# Patient Record
Sex: Male | Born: 1950 | Race: Black or African American | Hispanic: No | Smoking: Current every day smoker
Health system: Southern US, Community
[De-identification: ages and names within clinical notes are randomized; demographics above are authoritative.]

## PROBLEM LIST (undated history)

## (undated) DIAGNOSIS — I252 Old myocardial infarction: Secondary | ICD-10-CM

## (undated) DIAGNOSIS — I10 Essential (primary) hypertension: Secondary | ICD-10-CM

## (undated) DIAGNOSIS — I2510 Atherosclerotic heart disease of native coronary artery without angina pectoris: Secondary | ICD-10-CM

## (undated) HISTORY — DX: Essential (primary) hypertension: I10

## (undated) HISTORY — DX: Atherosclerotic heart disease of native coronary artery without angina pectoris: I25.10

## (undated) HISTORY — DX: Old myocardial infarction: I25.2

## (undated) HISTORY — PX: CORONARY ANGIOPLASTY WITH STENT PLACEMENT: SHX49

---

## 1998-02-16 ENCOUNTER — Emergency Department (HOSPITAL_COMMUNITY): Admission: EM | Admit: 1998-02-16 | Discharge: 1998-02-16 | Payer: Self-pay | Admitting: Emergency Medicine

## 2001-10-15 ENCOUNTER — Emergency Department (HOSPITAL_COMMUNITY): Admission: EM | Admit: 2001-10-15 | Discharge: 2001-10-15 | Payer: Self-pay | Admitting: Emergency Medicine

## 2001-10-15 ENCOUNTER — Encounter: Payer: Self-pay | Admitting: Emergency Medicine

## 2005-11-20 ENCOUNTER — Emergency Department (HOSPITAL_COMMUNITY): Admission: EM | Admit: 2005-11-20 | Discharge: 2005-11-20 | Payer: Self-pay | Admitting: Emergency Medicine

## 2006-06-12 DIAGNOSIS — I252 Old myocardial infarction: Secondary | ICD-10-CM

## 2006-06-12 HISTORY — DX: Old myocardial infarction: I25.2

## 2006-08-22 ENCOUNTER — Inpatient Hospital Stay (HOSPITAL_COMMUNITY): Admission: EM | Admit: 2006-08-22 | Discharge: 2006-08-26 | Payer: Self-pay | Admitting: Emergency Medicine

## 2006-08-23 HISTORY — PX: CARDIAC CATHETERIZATION: SHX172

## 2008-08-03 ENCOUNTER — Emergency Department (HOSPITAL_COMMUNITY): Admission: EM | Admit: 2008-08-03 | Discharge: 2008-08-03 | Payer: Self-pay | Admitting: Emergency Medicine

## 2010-04-07 ENCOUNTER — Ambulatory Visit: Payer: Self-pay | Admitting: Cardiovascular Disease

## 2010-04-20 ENCOUNTER — Telehealth (INDEPENDENT_AMBULATORY_CARE_PROVIDER_SITE_OTHER): Payer: Self-pay | Admitting: *Deleted

## 2010-04-21 ENCOUNTER — Encounter: Payer: Self-pay | Admitting: Cardiology

## 2010-04-21 ENCOUNTER — Ambulatory Visit: Payer: Self-pay | Admitting: Cardiology

## 2010-04-21 ENCOUNTER — Ambulatory Visit: Payer: Self-pay

## 2010-04-21 ENCOUNTER — Encounter (HOSPITAL_COMMUNITY)
Admission: RE | Admit: 2010-04-21 | Discharge: 2010-06-11 | Payer: Self-pay | Source: Home / Self Care | Attending: Cardiovascular Disease | Admitting: Cardiovascular Disease

## 2010-04-21 HISTORY — PX: CARDIOVASCULAR STRESS TEST: SHX262

## 2010-07-12 NOTE — Progress Notes (Signed)
Summary: Nuc Pre-Procedure  Phone Note Outgoing Call Call back at [facility] Phone [phone]   Call placed by: Antionette Char RN,  April 20, 2010 3:28 PM Call placed to: Patient Reason for Call: Confirm/change Appt Summary of Call: Reviewed information on Myoview Information Sheet (see scanned document for further details).  Spoke with patient.     Nuclear Med Background Indications for Stress Test: Evaluation for Ischemia, Stent Patency   History: Angioplasty, Heart Catheterization, Myocardial Infarction, Stents  History Comments: 2008 IWMI, 2008 Stent- RCA  Symptoms: Chest Pain    Nuclear Pre-Procedure Cardiac Risk Factors: Hypertension, Lipids

## 2010-07-12 NOTE — Assessment & Plan Note (Signed)
Summary: Cardiology Nuclear Testing  Nuclear Med Background Indications for Stress Test: Evaluation for Ischemia, Stent Patency   History: Angioplasty, Heart Catheterization, Myocardial Infarction, Stents  History Comments: 2008 IWMI, 2008 Stent- RCA  Symptoms: Chest Pain, Chest Pain with Exertion, Dizziness, DOE, Fatigue with Exertion, Palpitations, Rapid HR    Nuclear Pre-Procedure Cardiac Risk Factors: Hypertension, Lipids, Smoker Caffeine/Decaff Intake: None NPO After: 10:00 PM Lungs: Clear IV 0.9% NS with Angio Cath: 20g     IV Site: R Forearm IV Started by: Irean Hong, RN Chest Size (in) 42     Height (in): 71 Weight (lb): 149 BMI: 20.86  Nuclear Med Study 1 or 2 day study:  1 day     Stress Test Type:  Lexiscan Reading MD:  Olga Millers, MD     Referring MD:  Kristeen Miss Resting Radionuclide:  Technetium 89m Tetrofosmin     Resting Radionuclide Dose:  11 mCi  Stress Radionuclide:  Technetium 53m Tetrofosmin     Stress Radionuclide Dose:  33 mCi   Stress Protocol      Max HR:  98 bpm     Predicted Max HR:  163 bpm  Max Systolic BP: 141 mm Hg     Percent Max HR:  60.12 %Rate Pressure Product:  [address]  Lexiscan: 0.4 mg   Stress Test Technologist:  Irean Hong,  RN     Nuclear Technologist:  Domenic Polite, CNMT  Rest Procedure  Myocardial perfusion imaging was performed at rest 45 minutes following the intravenous administration of Technetium 22m Tetrofosmin.  Stress Procedure  The patient received IV Lexiscan 0.4 mg over 15-seconds.  Technetium 12m Tetrofosmin injected at 30-seconds.  The EKG was nondiagnostic due to baseline changes with lexiscan.  Quantitative spect images were obtained after a 45 minute delay.  QPS Raw Data Images:  Acquisition technically good; normal left ventricular size. Stress Images:  There is decreased uptake in the inferior wall Rest Images:  There is decreased uptake in the inferior wall slightly less prominent compared to the  stress images. Subtraction (SDS):  These findings are consistent with small prior inferobasal infarct with trivial peri-infarct ischemia. Transient Ischemic Dilatation:  1.05  (Normal <1.22)  Lung/Heart Ratio:  0.22  (Normal <0.45)  Quantitative Gated Spect Images QGS EDV:  85 ml QGS ESV:  42 ml QGS EF:  51 % QGS cine images:  Mild hypokinesis of the inferior wall.   Overall Impression  Exercise Capacity: Lexiscan with no exercise. BP Response: Normal blood pressure response. Clinical Symptoms: No chest pain ECG Impression: No significant ST segment change suggestive of ischemia. Overall Impression: Abnormal lexiscan nuclear study with small prior inferobasal infarct and trivial peri-infarct ischemia.  Appended Document: Cardiology Nuclear Testing copy sent to Dr.Nasher

## 2010-09-06 ENCOUNTER — Telehealth: Payer: Self-pay | Admitting: *Deleted

## 2010-09-06 DIAGNOSIS — N529 Male erectile dysfunction, unspecified: Secondary | ICD-10-CM

## 2010-09-06 DIAGNOSIS — I10 Essential (primary) hypertension: Secondary | ICD-10-CM

## 2010-09-06 MED ORDER — TADALAFIL 20 MG PO TABS: 20.0000 mg | ORAL_TABLET | Freq: Every day | ORAL | Status: AC | PRN

## 2010-09-06 NOTE — Telephone Encounter (Signed)
Message copied by Erskine Squibb on Tue Sep 06, 2010  1:08 PM ------      Message from: Delrae Alfred      Created: Tue Sep 06, 2010 10:25 AM      Regarding: NAHSER      Contact: [phone]       REQUESTING SAMPLES OF CIALIS

## 2010-09-27 LAB — URINALYSIS, ROUTINE W REFLEX MICROSCOPIC
Protein, ur: NEGATIVE mg/dL
Urobilinogen, UA: 0.2 mg/dL (ref 0.0–1.0)

## 2010-09-27 LAB — CBC
MCV: 92.5 fL (ref 78.0–100.0)
RBC: 5.42 MIL/uL (ref 4.22–5.81)
WBC: 9.3 10*3/uL (ref 4.0–10.5)

## 2010-09-27 LAB — POCT CARDIAC MARKERS
CKMB, poc: 4.9 ng/mL (ref 1.0–8.0)
Myoglobin, poc: 124 ng/mL (ref 12–200)
Troponin i, poc: <0.05 ng/mL (ref 0.00–0.09)

## 2010-09-27 LAB — DIFFERENTIAL
Lymphs Abs: 0.4 10*3/uL — ABNORMAL LOW (ref 0.7–4.0)
Monocytes Relative: 3 % (ref 3–12)
Neutro Abs: 8.6 10*3/uL — ABNORMAL HIGH (ref 1.7–7.7)
Neutrophils Relative %: 92 % — ABNORMAL HIGH (ref 43–77)

## 2010-09-27 LAB — BRAIN NATRIURETIC PEPTIDE: Pro B Natriuretic peptide (BNP): <30 pg/mL (ref 0.0–100.0)

## 2010-09-27 LAB — POCT I-STAT, CHEM 8
Chloride: 105 mEq/L (ref 96–112)
Creatinine, Ser: 1.2 mg/dL (ref 0.4–1.5)
Glucose, Bld: 115 mg/dL — ABNORMAL HIGH (ref 70–99)
Potassium: 3.4 mEq/L — ABNORMAL LOW (ref 3.5–5.1)

## 2010-10-25 NOTE — H&P (Signed)
NAME:  Javier Rios, Javier Rios NO.:  [id number]   MEDICAL RECORD NO.:  [id number]          PATIENT TYPE:  EMS   LOCATION:  MAJO                         FACILITY:  [facility]   PHYSICIAN:  Peter M. Swaziland, M.D.  DATE OF BIRTH:  Apr 05, 1951   DATE OF ADMISSION:  08/03/2008  DATE OF DISCHARGE:                              HISTORY & PHYSICAL   Mr. Debruhl is a 60 year old black male who is seen at the request of Dr.  Norlene Campbell in the emergency department for evaluation of chest pain.  The  patient has a history of coronary artery disease and had an acute  inferior myocardial infarction in March 2008.  This was treated with  direct stenting of the mid right coronary artery using a 2.75 x 16 mm  Taxus stent.  It was noted at that time that he had 60-70% disease in  the mid LAD that has been treated medically.  He has a history of  hypertension and dyslipidemia.  The patient works as Electrical engineer at  night.  He noted that while working early this morning at 2-3 a.m., he  developed a needle-like sensation in his right chest.  This lasted a few  seconds and then resolved.  He had it approximately 3 times and then no  more.  He subsequently went home and slept this morning.  He awoke at  2:30 this afternoon feeling sick.  He had acute nausea and vomiting x2  and diarrhea.  He had no fever or chills.  He has had no chest pain or  shortness of breath.  He noticed no blood in either his stools or  vomitus.  He admits that he has not been taking his Plavix for the past  month because he could not afford it.  He is also not taking cholesterol  medication.  He is taking aspirin and his blood pressure medication.  He  currently has no pain.  The patient reports that both his sister and  mother have had acute viral gastroenteritis this past week.   PAST MEDICAL HISTORY:  1. Hypertension.  2. Dyslipidemia.  3. Coronary artery disease as noted above.   MEDICATIONS:  1. Aspirin daily.  2.  Lisinopril/hydrochlorothiazide 20/25 mg per day.   He has no known allergies.   SOCIAL HISTORY:  The patient works as a Electrical engineer.  He also works  part time on a loading dock.  He is married.  He denies tobacco or  alcohol use.   His family history is positive for hypertension and stroke.   REVIEW OF SYSTEMS:  He has had no bleeding problems, stroke, or TIA.  He  has had no edema, orthopnea, or PND.  He denies any cough.  He has had  no dysuria.  All other systems were reviewed and are negative.   PHYSICAL EXAMINATION:  GENERAL:  The patient is a well-developed black  male, in no apparent distress.  VITAL SIGNS:  His blood pressure is 145/91, pulse is 73 and regular,  respirations 18, temperature is 96.4, sats 95% on room air.  HEENT:  He  is normocephalic, atraumatic.  Pupils equal, round, and  reactive.  Sclerae clear.  Oropharynx is clear.  NECK:  Supple without JVD, adenopathy, thyromegaly, or bruits.  LUNGS:  Clear to auscultation and percussion.  CARDIAC:  Regular rate and rhythm without murmur, rub, gallop, or click.  There is no chest wall tenderness to palpation.  ABDOMEN:  Soft, nontender.  Positive bowel sound.  No masses or  hepatosplenomegaly.  There is no rebound.  EXTREMITIES:  Femoral and pedal pulses are 2+ and symmetric.  He has no  edema or cyanosis.  SKIN:  Warm and dry.  NEUROLOGIC:  He is alert and oriented x4.  Cranial nerves II-XII are  intact.   LABORATORY DATA:  Chest x-ray shows no active disease.  ECG shows normal  sinus rhythm.  There is T-wave inversion in leads II, III, aVF, and V5-  V6.  While this is new compared to 2006, I do not have a post MI ECG  from 2008 to compare to.  There is no ST elevation or depression.   White count is 9300, hemoglobin 17.6, hematocrit 50.1, platelets  224,000.  Sodium is 134, potassium 3.4, chloride 105, CO2 24, BUN 16,  creatinine 1.2, glucose 115.  Urinalysis is negative.  BNP is less than  30.   Point-of-care cardiac enzymes are negative x1.   IMPRESSION:  1. Acute nausea, vomiting, diarrhea, consistent with viral      gastroenteritis.  2. Extremely atypical chest pain, unlikely that this is cardiac      related.  3. Hypertension.  4. Hypercholesterolemia.  5. Coronary artery disease with prior inferior myocardial infarction      in 2008 with stenting of the right coronary artery.   PLAN:  There is really nothing in the history to support acute cardiac  cause of his symptoms.  I will treat him for gastroenteritis.  We would  recommend to follow up with Dr. Harvie Bridge outpatient to review his  medical therapy and hopefully get him back on his Plavix and a statin  agent.  We will continue his current blood pressure medications.           ______________________________  Peter M. Sw[place]iland, M.D.     PMJ/MEDQ  D:  08/03/2008  T:  08/04/2008  Job:  [id number]   cc:   Vesta Mixer, M.D.  Robert A. Nicholos Johns, M.D.

## 2010-10-28 NOTE — Cardiovascular Report (Signed)
NAME:  Javier Rios, TIEMANN NO.:  [id number]   MEDICAL RECORD NO.:  [id number]          PATIENT TYPE:  INP   LOCATION:  2916                         FACILITY:  [facility]   PHYSICIAN:  Vesta Mixer, M.D. DATE OF BIRTH:  09-01-1950   DATE OF PROCEDURE:  DATE OF DISCHARGE:                            CARDIAC CATHETERIZATION   Mr. [name] is a 60 year old gentleman with a history of hypertension,  hypercholesterolemia, and cigarette smoking.  He was admitted this  evening by Dr. Waldon Reining for episodes of chest pain that occurred around 6  p.m. while he was playing softball.  The patient had positive cardiac  enzymes.  In addition, he developed episodes of bradycardia and also had  nonsustained ventricular tachycardia.  He was referred to [facility] for  urgent heart catheterization based on these issues.   The patient was still having some chest discomfort and when he arrived.   PROCEDURE:  Left heart catheterization with coronary angiography.  We  performed PCI of the mid-right coronary artery posterolateral branch and  the posterior descending artery.   The right femoral artery was easily cannulated using a modified  Seldinger technique.   HEMODYNAMIC RESULTS:  LV pressure is 108/12, with an aortic pressure of  107/62.   ANGIOGRAPHY:  Left main:  The left main has a proximal 30%-40% stenosis.  It maybe a little bit more severe in other views.   The left anterior descending artery is normal in the proximal segment.  There is a mid 70%-75% stenosis.  Following this, there is diffuse  disease between 50%-60% for the next 10-15 mm.  The distal LAD has mild  irregularities.   The left the first diagonal artery originates at the site of the 70%-75%  stenosis.   The left circumflex artery is a large vessel.  There is a proximal 20%-  30% stenosis.  The obtuse marginal branch is normal.   The right coronary artery is a moderate-sized vessel and is diffusely  diseased.  There  is a moderate 80%-90% ulcerated plaque in the mid-  lesion.  The distal RCA is filled with thrombus.   The left ventriculogram was performed in the 30 RAO position.  It  reveals well-preserved left ventricular systolic function.  The ejection  fraction is around 65%.  There is inferobasilar akinesis.   A 6-French Judkins right 4 side-hole guide was positioned in the right  coronary artery.  The patient was given 3600 units of heparin followed  by a second bolus of Integrilin.  Because the patient had had problems  with bradycardia and because he had an occluded right coronary artery we  gave him 1 mg of atropine.   A BMW wire was placed down to the right coronary artery out into the  posterolateral branch.  We initially attempted to use a Fetch catheter  to aspirate thrombus.  The Fetch catheter would not cross into the  distal right coronary artery.   At this point, a 2.5 x 15 mm Maverick was used and crossed into the  posterolateral branch.  It was inflated up to 3 atmospheres for  24  seconds.  We then pulled it back and inflated it up to 6 atmospheres.  We then positioned wire down the posterior descending artery.  When that  became visible, we inflated down there for 6 atmospheres.  This resulted  in significant improvement of the distal lesion.  There was still a  significant lesion in the midvessel.  There was still some thrombus  remain in the posterior descending artery which did not clear.   At this point, a 2.75 16 mm Taxus stent was placed across the mid-RCA  stenosis.  It was deployed at 12 atmospheres for 35 seconds.   Post-stent dilatation was achieved using a 3.0 x 15-mm Quantum Monorail.  We inflated up to 13 atmospheres for 15 seconds, followed by 15  atmospheres for 23 seconds.  This resulted in a significant improvement.  There was some sluggish flow following stent placement, but this  gradually cleared.   There was still significant thrombus in the posterior  descending artery.  We again tried to place the Fetch catheter down into the posterior  descending artery, but it once again would not cross.  We attempted to  daughter the clot with the wire but were not able to gain much progress.   We decided to stop the procedure with the successful angioplasty of the  mid-right coronary artery and opening of the posterolateral branch.  We  will continue Integrilin in hopes that the posterior descending artery  will clean up.   The patient has a significant mid-LAD stenosis that will need to be  addressed at a later date.  He also has a moderate left main stenosis  that needs to be considered.  He will either need stenting or  consideration for bypass grafting.  The patient is stable leaving the  catheterization lab.           ______________________________  Vesta Mixer, M.D.     PJN/MEDQ  D:  08/23/2006  T:  08/24/2006  Job:  [id number]   cc:   Molly Maduro A. Nicholos Johns, M.D.

## 2010-10-28 NOTE — Discharge Summary (Signed)
Javier Rios, CORDIAL NO.:  [id number]   MEDICAL RECORD NO.:  [id number]          PATIENT TYPE:  INP   LOCATION:  2027                         FACILITY:  [facility]   PHYSICIAN:  Vesta Mixer, M.D. DATE OF BIRTH:  [birth year]   DATE OF ADMISSION:  08/23/2006  DATE OF DISCHARGE:  08/26/2006                               DISCHARGE SUMMARY   DISCHARGE DIAGNOSES:  1. Coronary disease, status post inferior wall myocardial infarction.      He is status post percutaneous transluminal coronary angioplasty      and stenting of the mid-right coronary artery.  2. Known stenosis in his left anterior descending diagonal system.  3. Hypertension.  4. Dyslipidemia.   DISCHARGE MEDICATIONS:  1. Plavix 75 mg a day.  2. Enteric-coated aspirin 81 mg a day.  3. Metoprolol 25 mg a day.  4. Nitroglycerin 0.4 mg sublingually as needed.  5. Benicar/HCT 40 mg/25 mg 1/2 tablet a day.  6. Potassium chloride 20 mEq a day.  7. Lipitor 40 mg a day.   DISPOSITION:  The patient will see Dr. Elease Hashimoto in 1-2 weeks.  He is to  follow-up with Dr. Nicholos Johns as needed.   HISTORY:  Mr.  Rios is a 60 year old gentleman who presented with an  acute inferior wall myocardial infarction.  Please see dictated H&P for  further details.   HOSPITAL COURSE:  1. Inferior wall myocardial infarction.  The patient was admitted with      chest pain but had progressive ST changes with persistent chest      pain.  He underwent emergent heart catheterization and was found to      have a complete occlusion of his right coronary artery.  There was      a huge thrombus burden in the right coronary artery.  We attempted      to aspirate some of the thrombus out with a fetch catheter and then      placed a stent.  He still had some persistent thrombus down the      posterior descending artery that we were not able to clear.  At the      time of the catheterization, he was found to have a 60%-70% mid-LAD      lesion.   This lesion is quite eccentric.  We did not angioplasty to      the LAD.  The patient did well but did have some nonsustained      ventricular tachycardia the day following the procedure.  We      started him on some metoprolol, and this improved.  We also found      that his potassium was low, and this was also replaced.  The      patient will be discharged in satisfactory condition.  We will do a      stress Cardiolite study in about a month to assess his LAD      stenosis.  If he is found to have ischemia in the anterior      distribution, we will proceed with angioplasty and stenting  of the      LAD.  Otherwise, we will continue with medical therapy.  2. Dyslipidemia.  The patient had been placed on Caduet in the past.      He stopped he medication because it caused him to become      hypotensive.  I have told him that we should go ahead and start him      on some Lipitor, and that should not cause any decrease in his      blood pressure.  The Caduet lowered his blood pressure because of      the amlodipine component.  3. Hypokalemia.  The patient was admitted on Benicar/HCT.  He was      hypokalemic.  We started him on some potassium, and this was      gradually improved.  We will discharge him home on some potassium.  4. All of his other medical problems remained stable.  He will follow      up with Dr. Elease Hashimoto in several weeks.           ______________________________  Vesta Mixer, M.D.     PJN/MEDQ  D:  08/26/2006  T:  08/27/2006  Job:  [id number]   cc:   Molly Maduro A. Nicholos Johns, M.D.

## 2011-03-23 ENCOUNTER — Encounter: Payer: Self-pay | Admitting: Cardiovascular Disease

## 2011-03-29 ENCOUNTER — Other Ambulatory Visit: Payer: Self-pay | Admitting: *Deleted

## 2011-03-29 ENCOUNTER — Encounter: Payer: Self-pay | Admitting: Cardiovascular Disease

## 2011-03-29 ENCOUNTER — Ambulatory Visit (INDEPENDENT_AMBULATORY_CARE_PROVIDER_SITE_OTHER): Payer: Self-pay | Admitting: Cardiovascular Disease

## 2011-03-29 VITALS — BP 136/94 | HR 67 | Ht 71.0 in | Wt 149.0 lb

## 2011-03-29 DIAGNOSIS — I2510 Atherosclerotic heart disease of native coronary artery without angina pectoris: Secondary | ICD-10-CM | POA: Insufficient documentation

## 2011-03-29 MED ORDER — METOPROLOL TARTRATE 25 MG PO TABS: ORAL_TABLET | ORAL | Status: DC

## 2011-03-29 MED ORDER — ATORVASTATIN CALCIUM 40 MG PO TABS: 40.0000 mg | ORAL_TABLET | Freq: Every day | ORAL | Status: DC

## 2011-03-29 MED ORDER — NITROGLYCERIN 0.4 MG SL SUBL: 0.4000 mg | SUBLINGUAL_TABLET | SUBLINGUAL | Status: DC | PRN

## 2011-03-29 NOTE — Progress Notes (Signed)
Javier Rios Date of Birth  Oct 05, 1950 [facility] [address]    [address] [place], [place]  [address] [phone]  Fax  [phone]  History of Present Illness:  60 year old gentleman with a history of coronary artery disease. He is status post PTCA and stenting of his right coronary artery. He has a moderate stenosis in his LAD.  His last stress Myoview study was November, 2011. There is no evidence of anterior wall ischemia. He has a mild defect in his Inderal which corresponds to his previous inferior basilar myocardial infarction.  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 Current Outpatient Prescriptions on File Prior to Visit  Medication Sig Dispense Refill  . aspirin 81 MG tablet Take 81 mg by mouth daily.        . hydrochlorothiazide (HYDRODIURIL) 25 MG tablet Take 25 mg by mouth daily.        . nitroGLYCERIN (NITROSTAT) 0.4 MG SL tablet Place 0.4 mg under the tongue every 5 (five) minutes as needed.          No Known Allergies  Past Medical History  Diagnosis Date  . Coronary artery disease     Status post PTCA and stenting of his right coronary artery in March, 2008. He also has a 40-50% LAD stenosis  . LAD stenosis     MODERATE BETWEEN 50-60%  . Hypertension   . Chest pain   . 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 Cardiac catheterization 08/23/2006    EF 65%. THERE IS INFEROBASILAR AKINESIS  . Coronary angioplasty with stent placement     STENTING OF HIS RCA  . Cardiovascular stress test 04/21/2010    EF 51%. NO SIGNIFICANT ST SEGMENT CHANGE SUGGESTIVE OF ISCHEMIA    History  Smoking status  . Former Smoker  . Quit date: 08/11/2006  Smokeless tobacco  . Not on file    History  Alcohol Use No    No family history on file.  Reviw of Systems:  Reviewed in the HPI.  All other systems are negative.  Physical Exam: BP 136/94  Pulse 67  Ht 5\' 11"  (1.803 m)  Wt 149 lb  (67.586 kg)  BMI 20.78 kg/m2 The patient is alert and oriented x 3.  The mood and affect are normal.   Skin: warm and dry.  Color is normal.    HEENT:   the sclera are nonicteric.  The mucous membranes are moist.  The carotids are 2+ without bruits.  There is no thyromegaly.  There is no JVD.    Lungs: clear.  The chest wall is non tender.    Heart: regular rate with a normal S1 and S2.  There are no murmurs, gallops, or rubs. The PMI is not displaced.     Abdomen: good bowel sounds.  There is no guarding or rebound.  There is no hepatosplenomegaly or tenderness.  There are no masses.   Extremities:  no clubbing, cyanosis, or edema.  The legs are without rashes.  The distal pulses are intact.   Neuro:  Cranial nerves II - XII are intact.  Motor and sensory functions are intact.    The gait is normal.  ECG:  Assessment / Plan:

## 2011-03-29 NOTE — Assessment & Plan Note (Signed)
Dionis doing very well from a cardiac standpoint.  He's discontinued several of his medications on his own. He was previously on Lipitor but stopped it because of a cost issue and that now Lipitor is generic and have  started that medication. We also used abnormal and the Toprol but then restarted Toprol-XL 5 mg twice a day. I've also refilled his nitroglycerin. I'll see her again in 6 months.

## 2011-03-29 NOTE — Patient Instructions (Addendum)
Your physician wants you to follow-up in: 6 months, You will receive a reminder letter in the mail two months in advance. If you don't receive a letter, please call our office to schedule the follow-up appointment.  Your physician recommends that you return for lab work in: 3 months FASTING  Your refills have been done.

## 2011-06-14 ENCOUNTER — Other Ambulatory Visit: Payer: Self-pay | Admitting: *Deleted

## 2011-06-14 MED ORDER — HYDROCHLOROTHIAZIDE 25 MG PO TABS: 25.0000 mg | ORAL_TABLET | Freq: Every day | ORAL | Status: DC

## 2011-06-14 NOTE — Telephone Encounter (Signed)
Fax Received. Refill Completed. Lajoya Dombek Chowoe (R.M.A)   

## 2011-06-29 ENCOUNTER — Other Ambulatory Visit: Payer: Self-pay | Admitting: *Deleted

## 2011-09-19 ENCOUNTER — Ambulatory Visit: Payer: Self-pay | Admitting: Cardiovascular Disease

## 2011-09-22 ENCOUNTER — Ambulatory Visit: Payer: Self-pay | Admitting: Cardiovascular Disease

## 2011-10-12 ENCOUNTER — Encounter: Payer: Self-pay | Admitting: Cardiovascular Disease

## 2011-10-12 ENCOUNTER — Ambulatory Visit (INDEPENDENT_AMBULATORY_CARE_PROVIDER_SITE_OTHER): Payer: Self-pay | Admitting: Cardiovascular Disease

## 2011-10-12 VITALS — BP 140/95 | HR 76 | Ht 71.0 in | Wt 149.8 lb

## 2011-10-12 DIAGNOSIS — I2510 Atherosclerotic heart disease of native coronary artery without angina pectoris: Secondary | ICD-10-CM

## 2011-10-12 DIAGNOSIS — E785 Hyperlipidemia, unspecified: Secondary | ICD-10-CM

## 2011-10-12 DIAGNOSIS — I10 Essential (primary) hypertension: Secondary | ICD-10-CM

## 2011-10-12 MED ORDER — POTASSIUM CHLORIDE CRYS ER 10 MEQ PO TBCR: 10.0000 meq | EXTENDED_RELEASE_TABLET | Freq: Every day | ORAL | Status: DC

## 2011-10-12 MED ORDER — METOPROLOL TARTRATE 25 MG PO TABS: ORAL_TABLET | ORAL | Status: DC

## 2011-10-12 MED ORDER — HYDROCHLOROTHIAZIDE 25 MG PO TABS: 25.0000 mg | ORAL_TABLET | Freq: Every day | ORAL | Status: DC

## 2011-10-12 MED ORDER — SIMVASTATIN 40 MG PO TABS: 40.0000 mg | ORAL_TABLET | Freq: Every evening | ORAL | Status: DC

## 2011-10-12 NOTE — Patient Instructions (Addendum)
Your physician recommends that you schedule a follow-up appointment in: 3 months  Your physician recommends that you return for a FASTING lipid profile: 3 months   Your physician has recommended you make the following change in your medication:  Stop atorvastatin Start simvastatin 40 mg daily Start potassium 10 meq daily

## 2011-10-12 NOTE — Progress Notes (Signed)
[place] [facility] [address] [place], [place]  [address]   Office [address] [place], [place]  [address]  Javier Rios Date of Birth  1950-08-08  Problem list: 1. History of coronary artery disease-status post PTCA and stenting of his right coronary artery-08/23/2006 he has moderate disease involving his LAD. 2. Hypertension 3. Hyperlipidemia  History of Present Illness:  61 year old gentleman with a history of coronary artery disease. He is status post PTCA and stenting of his right coronary artery. He has a moderate stenosis in his LAD.  His last stress Myoview study was November, 2011. There is no evidence of anterior wall ischemia. He has a mild defect in his inferior wall which corresponds to his previous inferior basilar myocardial infarction.  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  He has run out of most of his medications.    Current Outpatient Prescriptions on File Prior to Visit  Medication Sig Dispense Refill  . aspirin 81 MG tablet Take 81 mg by mouth daily.        . hydrochlorothiazide (HYDRODIURIL) 25 MG tablet Take 1 tablet (25 mg total) by mouth daily.  30 tablet  3  . atorvastatin (LIPITOR) 40 MG tablet Take 1 tablet (40 mg total) by mouth daily.  30 tablet  5  . metoprolol tartrate (LOPRESSOR) 25 MG tablet One half tablet (12.5 mg) twice daily- 12 hours apart  30 tablet  5  . nitroGLYCERIN (NITROSTAT) 0.4 MG SL tablet Place 1 tablet (0.4 mg total) under the tongue every 5 (five) minutes as needed.  25 tablet  5    No Known Allergies  Past Medical History  Diagnosis Date  . Coronary artery disease     Status post PTCA and stenting of his right coronary artery in March, 2008. He also has a 40-50% LAD stenosis  . LAD stenosis     MODERATE BETWEEN 50-60%  . Hypertension   . Chest pain   . 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 Cardiac  catheterization 08/23/2006    EF 65%. THERE IS INFEROBASILAR AKINESIS  . Coronary angioplasty with stent placement     STENTING OF HIS RCA  . Cardiovascular stress test 04/21/2010    EF 51%. NO SIGNIFICANT ST SEGMENT CHANGE SUGGESTIVE OF ISCHEMIA    History  Smoking status  . Current Everyday Smoker  Smokeless tobacco  . Not on file    History  Alcohol Use No    No family history on file.  Reviw of Systems:  Reviewed in the HPI.  All other systems are negative.  Physical Exam: BP 199/105  Pulse 76  Ht 5\' 11"  (1.803 m)  Wt 149 lb 12.8 oz (67.949 kg)  BMI 20.89 kg/m2 The patient is alert and oriented x 3.  The mood and affect are normal.   Skin: warm and dry.  Color is normal.    HEENT:   the sclera are nonicteric.  The mucous membranes are moist.  The carotids are 2+ without bruits.  There is no thyromegaly.  There is no JVD.    Lungs: clear.  The chest wall is non tender.    Heart: regular rate with a normal S1 and S2.  There are no murmurs, gallops, or rubs. The PMI is not displaced.     Abdomen: good bowel sounds.  There is no  guarding or rebound.  There is no hepatosplenomegaly or tenderness.  There are no masses.   Extremities:  no clubbing, cyanosis, or edema.  The legs are without rashes.  The distal pulses are intact.   Neuro:  Cranial nerves II - XII are intact.  Motor and sensory functions are intact.    The gait is normal.  ECG:  Assessment / Plan:

## 2011-10-12 NOTE — Assessment & Plan Note (Signed)
Javier Rios presents today for followup. He has run out of all of his medications except for his aspirin. He cannot afford the atorvastatin so we will change him to simvastatin. I've refilled all of his other medications.  He continues to smoke one pack of cigarettes a day. His costs $3 per day. I reminded him that he would be able to afford his medications if  he stopped smoking.  I will see him again   in 3 months for lab work. I'll see him in 6 months for an office visit.

## 2011-10-12 NOTE — Assessment & Plan Note (Signed)
His blood pressure was markedly elevated when he first came in. After several minutes it came down to 140/95 range. I've encouraged him to continue to take his medications. I've asked him to stop smoking which would help him be able to afford his medications.  We talked about complications of long-standing and uncontrolled hypertension. He seems to understand.

## 2012-01-23 ENCOUNTER — Other Ambulatory Visit (INDEPENDENT_AMBULATORY_CARE_PROVIDER_SITE_OTHER): Payer: Self-pay

## 2012-01-23 ENCOUNTER — Encounter: Payer: Self-pay | Admitting: Cardiovascular Disease

## 2012-01-23 ENCOUNTER — Telehealth: Payer: Self-pay | Admitting: Cardiovascular Disease

## 2012-01-23 ENCOUNTER — Ambulatory Visit (INDEPENDENT_AMBULATORY_CARE_PROVIDER_SITE_OTHER): Payer: Self-pay | Admitting: Cardiovascular Disease

## 2012-01-23 VITALS — BP 154/104 | HR 68 | Ht 71.0 in | Wt 141.4 lb

## 2012-01-23 DIAGNOSIS — E785 Hyperlipidemia, unspecified: Secondary | ICD-10-CM

## 2012-01-23 DIAGNOSIS — I10 Essential (primary) hypertension: Secondary | ICD-10-CM

## 2012-01-23 DIAGNOSIS — I2510 Atherosclerotic heart disease of native coronary artery without angina pectoris: Secondary | ICD-10-CM

## 2012-01-23 LAB — HEPATIC FUNCTION PANEL
AST: 23 U/L (ref 0–37)
Alkaline Phosphatase: 65 U/L (ref 39–117)
Total Bilirubin: 0.5 mg/dL (ref 0.3–1.2)

## 2012-01-23 LAB — BASIC METABOLIC PANEL
Calcium: 9.6 mg/dL (ref 8.4–10.5)
Chloride: 97 mEq/L (ref 96–112)
Creatinine, Ser: 1 mg/dL (ref 0.4–1.5)
GFR: 101.8 mL/min (ref >60.00)

## 2012-01-23 LAB — LIPID PANEL
Cholesterol: 217 mg/dL — ABNORMAL HIGH (ref 0–200)
HDL: 61.4 mg/dL (ref >39.00)
Total CHOL/HDL Ratio: 4
VLDL: 24.4 mg/dL (ref 0.0–40.0)

## 2012-01-23 LAB — LDL CHOLESTEROL, DIRECT: Direct LDL: 134.7 mg/dL

## 2012-01-23 NOTE — Patient Instructions (Addendum)
Your physician recommends that you schedule a follow-up appointment in: 3 months to review BP  REDUCE HIGH SODIUM FOODS LIKE CANNED SOUP, GRAVY, SAUCES, READY PREPARED FOODS LIKE FROZEN FOODS; LEAN CUISINE, LASAGNA. BACON, SAUSAGE, LUNCH MEAT, FAST FOODS.Marland Kitchen   DASH Diet The DASH diet stands for "Dietary Approaches to Stop Hypertension." It is a healthy eating plan that has been shown to reduce high blood pressure (hypertension) in as little as 14 days, while also possibly providing other significant health benefits. These other health benefits include reducing the risk of breast cancer after menopause and reducing the risk of type 2 diabetes, heart disease, colon cancer, and stroke. Health benefits also include weight loss and slowing kidney failure in patients with chronic kidney disease.  DIET GUIDELINES  Limit salt (sodium). Your diet should contain less than 1500 mg of sodium daily.   Limit refined or processed carbohydrates. Your diet should include mostly whole grains. Desserts and added sugars should be used sparingly.   Include small amounts of heart-healthy fats. These types of fats include nuts, oils, and tub margarine. Limit saturated and trans fats. These fats have been shown to be harmful in the body.  CHOOSING FOODS  The following food groups are based on a 2000 calorie diet. See your Registered Dietitian for individual calorie needs. Grains and Grain Products (6 to 8 servings daily)  Eat More Often: Whole-wheat bread, brown rice, whole-grain or wheat pasta, quinoa, popcorn without added fat or salt (air popped).   Eat Less Often: White bread, white pasta, white rice, cornbread.  Vegetables (4 to 5 servings daily)  Eat More Often: Fresh, frozen, and canned vegetables. Vegetables may be raw, steamed, roasted, or grilled with a minimal amount of fat.   Eat Less Often/Avoid: Creamed or fried vegetables. Vegetables in a cheese sauce.  Fruit (4 to 5 servings daily)  Eat More Often:  All fresh, canned (in natural juice), or frozen fruits. Dried fruits without added sugar. One hundred percent fruit juice ( cup [237 mL] daily).   Eat Less Often: Dried fruits with added sugar. Canned fruit in light or heavy syrup.  [employer], Fish, and Poultry (2 servings or less daily. One serving is 3 to 4 oz [85-114 g]).  Eat More Often: Ninety percent or leaner ground beef, tenderloin, sirloin. Round cuts of beef, chicken breast, [place] breast. All fish. Grill, bake, or broil your meat. Nothing should be fried.   Eat Less Often/Avoid: Fatty cuts of meat, [place], or chicken leg, thigh, or wing. Fried cuts of meat or fish.  Dairy (2 to 3 servings)  Eat More Often: Low-fat or fat-free milk, low-fat plain or light yogurt, reduced-fat or part-skim cheese.   Eat Less Often/Avoid: Milk (whole, 2%, skim, or chocolate).Whole milk yogurt. Full-fat cheeses.  Nuts, Seeds, and Legumes (4 to 5 servings per week)  Eat More Often: All without added salt.   Eat Less Often/Avoid: Salted nuts and seeds, canned beans with added salt.  Fats and Sweets (limited)  Eat More Often: Vegetable oils, tub margarines without trans fats, sugar-free gelatin. Mayonnaise and salad dressings.   Eat Less Often/Avoid: Coconut oils, palm oils, butter, stick margarine, cream, half and half, cookies, candy, pie.  FOR MORE INFORMATION The Dash Diet Eating Plan: www.dashdiet.org Document Released: 05/18/2011 Document Reviewed: 05/08/2011 [facility] Patient Information 2012 [place], [place].

## 2012-01-23 NOTE — Progress Notes (Signed)
[place] [facility] [address] [place], [place]  [address]  [place] Office [address] [place], [place]  [address]  Javier Rios Date of Birth  1950-08-10  Problem list: 1. History of coronary artery disease-status post PTCA and stenting of his right coronary artery-08/23/2006 he has moderate disease involving his LAD. 2. Hypertension 3. Hyperlipidemia  History of Present Illness:  61 year old gentleman with a history of coronary artery disease. He is status post PTCA and stenting of his right coronary artery. He has a moderate stenosis in his LAD.  His last stress Myoview study was November, 2011. There is no evidence of anterior wall ischemia. He has a mild defect in his inferior wall which corresponds to his previous inferior basilar myocardial infarction.  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  He is not having any chest pain.  He has trouble with getting his meds.  He is eating lots of bologna because it is inexpensive.  His power to his apartment is off and he is unable to eat regular food.  He has been going to [employer] and eating chicken biscuits.       Current Outpatient Prescriptions on File Prior to Visit  Medication Sig Dispense Refill  . aspirin 81 MG tablet Take 81 mg by mouth daily.        . hydrochlorothiazide (HYDRODIURIL) 25 MG tablet Take 1 tablet (25 mg total) by mouth daily.  30 tablet  3  . metoprolol tartrate (LOPRESSOR) 25 MG tablet One half tablet (12.5 mg) twice daily- 12 hours apart  30 tablet  5  . nitroGLYCERIN (NITROSTAT) 0.4 MG SL tablet Place 1 tablet (0.4 mg total) under the tongue every 5 (five) minutes as needed.  25 tablet  5  . potassium chloride (K-DUR,KLOR-CON) 10 MEQ tablet Take 1 tablet (10 mEq total) by mouth daily.  30 tablet  5  . simvastatin (ZOCOR) 40 MG tablet Take 1 tablet (40 mg total) by mouth every evening.  30 tablet  5    No Known Allergies  Past  Medical History  Diagnosis Date  . Coronary artery disease     Status post PTCA and stenting of his right coronary artery in March, 2008. He also has a 40-50% LAD stenosis  . LAD stenosis     MODERATE BETWEEN 50-60%  . Hypertension   . Chest pain   . 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 Cardiac catheterization 08/23/2006    EF 65%. THERE IS INFEROBASILAR AKINESIS  . Coronary angioplasty with stent placement     STENTING OF HIS RCA  . Cardiovascular stress test 04/21/2010    EF 51%. NO SIGNIFICANT ST SEGMENT CHANGE SUGGESTIVE OF ISCHEMIA    History  Smoking status  . Current Everyday Smoker  Smokeless tobacco  . Not on file    History  Alcohol Use No    No family history on file.  Reviw of Systems:  Reviewed in the HPI.  All other systems are negative.  Physical Exam: BP 154/104  Pulse 68  Ht 5\' 11"  (1.803 m)  Wt 141 lb 6.4 oz (64.139 kg)  BMI 19.72 kg/m2 The patient is alert and oriented x 3.  The mood and affect are normal.   Skin: warm and dry.  Color is normal.    HEENT:   the sclera are nonicteric.  The  mucous membranes are moist.  The carotids are 2+ without bruits.  There is no thyromegaly.  There is no JVD.    Lungs: clear.  The chest wall is non tender.    Heart: regular rate with a normal S1 and S2.  There are no murmurs, gallops, or rubs. The PMI is not displaced.     Abdomen: good bowel sounds.  There is no guarding or rebound.  There is no hepatosplenomegaly or tenderness.  There are no masses.   Extremities:  no clubbing, cyanosis, or edema.  The legs are without rashes.  The distal pulses are intact.   Neuro:  Cranial nerves II - XII are intact.  Motor and sensory functions are intact.    The gait is normal.  ECG: January 23, 2012-NSR at 74, Biatrial enlargement.  Assessment / Plan:

## 2012-01-23 NOTE — Telephone Encounter (Signed)
New Problem:    Patient called in with a question about his paperwork he received form today.  Please call back.

## 2012-01-23 NOTE — Assessment & Plan Note (Signed)
Javier Rios blood pressure is significantly elevated today. He's been eating a lot of bologna sandwiches and chicken sandwiches from [employer]. His power is currently out and he is  having trouble eating .  I've asked him to cut out the bologna and his stomach and also asked him to stop predominantly getting chicken biscuits.  We've given him some information on the DASH diet.    Estimated 3 months for followup visit.  Fortunately he's not having episodes of angina.

## 2012-01-23 NOTE — Telephone Encounter (Signed)
Wanted to know what abbreviation for CAD meant, all questions answered.

## 2012-01-24 ENCOUNTER — Other Ambulatory Visit: Payer: Self-pay | Admitting: *Deleted

## 2012-01-24 DIAGNOSIS — I10 Essential (primary) hypertension: Secondary | ICD-10-CM

## 2012-01-24 DIAGNOSIS — E876 Hypokalemia: Secondary | ICD-10-CM

## 2012-01-24 DIAGNOSIS — I2510 Atherosclerotic heart disease of native coronary artery without angina pectoris: Secondary | ICD-10-CM

## 2012-01-24 DIAGNOSIS — E785 Hyperlipidemia, unspecified: Secondary | ICD-10-CM

## 2012-01-24 MED ORDER — POTASSIUM CHLORIDE CRYS ER 10 MEQ PO TBCR: 20.0000 meq | EXTENDED_RELEASE_TABLET | Freq: Every day | ORAL | Status: DC

## 2012-01-24 MED ORDER — METOPROLOL TARTRATE 25 MG PO TABS: ORAL_TABLET | ORAL | Status: DC

## 2012-01-24 NOTE — Telephone Encounter (Signed)
Pt not consistent with med's/ he is taking more hctz than prescribed, told pt this is lowering potassium in body and taught him consequences, told him to call with elevated BP and Dr can adjust his metoprolol,  pt will take potassium starting tomorrow and he was set up for a bmet in 2 weeks, he is unsure if he will take cholesterol med's at this time due to expense. Told him to discuss with Dr on next ov. K+ was changed to take 20 meq daily. Pt has f/u app in 6 weeks.

## 2012-01-24 NOTE — Telephone Encounter (Signed)
Message copied by Antony Odea on Wed Jan 24, 2012  9:44 AM ------      Message from: Vesta Mixer      Created: Tue Jan 23, 2012  8:23 PM       i'm not sure if he was really taking his meds.  Increase Kdur to 20 QD.   I dont think he was taking his statin.. Recheck labs in 3 months.

## 2012-02-08 ENCOUNTER — Other Ambulatory Visit: Payer: Self-pay

## 2012-03-22 ENCOUNTER — Other Ambulatory Visit: Payer: Self-pay | Admitting: Cardiovascular Disease

## 2012-04-18 ENCOUNTER — Ambulatory Visit: Payer: Self-pay | Admitting: Cardiovascular Disease

## 2012-05-13 ENCOUNTER — Ambulatory Visit: Payer: Self-pay | Admitting: Cardiovascular Disease

## 2012-05-24 ENCOUNTER — Ambulatory Visit: Payer: Self-pay | Admitting: Cardiovascular Disease

## 2012-05-31 ENCOUNTER — Encounter: Payer: Self-pay | Admitting: Cardiovascular Disease

## 2012-07-16 ENCOUNTER — Telehealth: Payer: Self-pay | Admitting: Cardiovascular Disease

## 2012-07-16 NOTE — Telephone Encounter (Signed)
No further recommendations.  Perhaps he should stop smoking.

## 2012-07-16 NOTE — Telephone Encounter (Signed)
Noted  

## 2012-07-16 NOTE — Telephone Encounter (Signed)
Spoke with pharmacy, metoprolol is 11.99 a month and hctz is 8.00 a month, I called pt and explained that we do not get samples once meds are generic. Told pt to avoid salt and check bp daily and get back on med as soon as possible, i WILL FORWARD TO DR Elease Hashimoto FOR ADVISE ON A SAMPLE MED WE COULD SUBSTITUTE.

## 2012-07-16 NOTE — Telephone Encounter (Signed)
New problem:  Been out of blood pressure  medication for about   5  days now.     C/O headache . Any samples would be helpful

## 2012-07-18 ENCOUNTER — Encounter: Payer: Self-pay | Admitting: Cardiovascular Disease

## 2012-07-18 ENCOUNTER — Ambulatory Visit (INDEPENDENT_AMBULATORY_CARE_PROVIDER_SITE_OTHER): Payer: Self-pay | Admitting: Cardiovascular Disease

## 2012-07-18 VITALS — BP 166/100 | HR 77 | Ht 71.0 in | Wt 158.8 lb

## 2012-07-18 DIAGNOSIS — I10 Essential (primary) hypertension: Secondary | ICD-10-CM

## 2012-07-18 DIAGNOSIS — I2510 Atherosclerotic heart disease of native coronary artery without angina pectoris: Secondary | ICD-10-CM

## 2012-07-18 NOTE — Assessment & Plan Note (Signed)
Fortunately, he's not having any episodes of chest pain. He has continued taking his aspirin even though he ran out of his other medications. We'll continue with his same medications. I'll see him in 6 months. Check fasting labs at that time.

## 2012-07-18 NOTE — Patient Instructions (Addendum)
Your physician wants you to follow-up in: 6 MONTHS  You will receive a reminder letter in the mail two months in advance. If you don't receive a letter, please call our office to schedule the follow-up appointment.   Your physician recommends that you return for a FASTING lipid profile: 6 MONTHS   REDUCE HIGH SODIUM FOODS LIKE CANNED SOUP, GRAVY, SAUCES, READY PREPARED FOODS LIKE FROZEN FOODS; LEAN CUISINE, LASAGNA. BACON, SAUSAGE, LUNCH MEAT, FAST FOODS.. HOT DOGS AND CHIPS  DASH Diet The DASH diet stands for "Dietary Approaches to Stop Hypertension." It is a healthy eating plan that has been shown to reduce high blood pressure (hypertension) in as little as 14 days, while also possibly providing other significant health benefits. These other health benefits include reducing the risk of breast cancer after menopause and reducing the risk of type 2 diabetes, heart disease, colon cancer, and stroke. Health benefits also include weight loss and slowing kidney failure in patients with chronic kidney disease.  DIET GUIDELINES  Limit salt (sodium). Your diet should contain less than 1500 mg of sodium daily.  Limit refined or processed carbohydrates. Your diet should include mostly whole grains. Desserts and added sugars should be used sparingly.  Include small amounts of heart-healthy fats. These types of fats include nuts, oils, and tub margarine. Limit saturated and trans fats. These fats have been shown to be harmful in the body. CHOOSING FOODS  The following food groups are based on a 2000 calorie diet. See your Registered Dietitian for individual calorie needs. Grains and Grain Products (6 to 8 servings daily)  Eat More Often: Whole-wheat bread, brown rice, whole-grain or wheat pasta, quinoa, popcorn without added fat or salt (air popped).  Eat Less Often: White bread, white pasta, white rice, cornbread. Vegetables (4 to 5 servings daily)  Eat More Often: Fresh, frozen, and canned  vegetables. Vegetables may be raw, steamed, roasted, or grilled with a minimal amount of fat.  Eat Less Often/Avoid: Creamed or fried vegetables. Vegetables in a cheese sauce. Fruit (4 to 5 servings daily)  Eat More Often: All fresh, canned (in natural juice), or frozen fruits. Dried fruits without added sugar. One hundred percent fruit juice ( cup [237 mL] daily).  Eat Less Often: Dried fruits with added sugar. Canned fruit in light or heavy syrup. [employer], Fish, and Poultry (2 servings or less daily. One serving is 3 to 4 oz [85-114 g]).  Eat More Often: Ninety percent or leaner ground beef, tenderloin, sirloin. Round cuts of beef, chicken breast, [place] breast. All fish. Grill, bake, or broil your meat. Nothing should be fried.  Eat Less Often/Avoid: Fatty cuts of meat, [place], or chicken leg, thigh, or wing. Fried cuts of meat or fish. Dairy (2 to 3 servings)  Eat More Often: Low-fat or fat-free milk, low-fat plain or light yogurt, reduced-fat or part-skim cheese.  Eat Less Often/Avoid: Milk (whole, 2%).Whole milk yogurt. Full-fat cheeses. Nuts, Seeds, and Legumes (4 to 5 servings per week)  Eat More Often: All without added salt.  Eat Less Often/Avoid: Salted nuts and seeds, canned beans with added salt. Fats and Sweets (limited)  Eat More Often: Vegetable oils, tub margarines without trans fats, sugar-free gelatin. Mayonnaise and salad dressings.  Eat Less Often/Avoid: Coconut oils, palm oils, butter, stick margarine, cream, half and half, cookies, candy, pie. FOR MORE INFORMATION The Dash Diet Eating Plan: www.dashdiet.org Document Released: 05/18/2011 Document Revised: 08/21/2011 Document Reviewed: 05/18/2011 [facility] Patient Information 2013 [place], [place].

## 2012-07-18 NOTE — Progress Notes (Signed)
[place] Office [address] [place], [place]  [address]  [place] [facility] [address] [place], [place]  [address]  Javier Rios Date of Birth  1951-04-28  Problem list: 1. History of coronary artery disease-status post PTCA and stenting of his right coronary artery-08/23/2006 he has moderate disease involving his LAD. 2. Hypertension 3. Hyperlipidemia  History of Present Illness:  62 year old gentleman with a history of coronary artery disease. He is status post PTCA and stenting of his right coronary artery. He has a moderate stenosis in his LAD.  His last stress Myoview study was November, 2011. There is no evidence of anterior wall ischemia. He has a mild defect in his inferior wall which corresponds to his previous inferior basilar myocardial infarction.  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  He is not having any chest pain.  He has trouble with getting his meds.  He is eating lots of bologna because it is inexpensive.  His power to his apartment is off and he is unable to eat regular food.  He has been going to [employer] and eating chicken biscuits.    Feb. 6, 2014:  He has stopped taking all medications. He states he is not able to afford them. 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  He ran out of his meds.  He is still eating lots of salty foods ( bologna, fast foods)    Current Outpatient Prescriptions on File Prior to Visit  Medication Sig Dispense Refill  . aspirin 81 MG tablet Take 81 mg by mouth daily.        . hydrochlorothiazide (HYDRODIURIL) 25 MG tablet TAKE 1 TABLET BY MOUTH EVERY DAY  30 tablet  3  . metoprolol tartrate (LOPRESSOR) 25 MG tablet One half tablet (12.5 mg) twice daily- 12 hours apart  30 tablet  5  . nitroGLYCERIN (NITROSTAT) 0.4 MG SL tablet Place 1 tablet (0.4 mg total) under the tongue every 5 (five) minutes as needed.  25 tablet  5  .  potassium chloride (K-DUR,KLOR-CON) 10 MEQ tablet Take 2 tablets (20 mEq total) by mouth daily.  60 tablet  5  . simvastatin (ZOCOR) 40 MG tablet Take 1 tablet (40 mg total) by mouth every evening.  30 tablet  5    No Known Allergies  Past Medical History  Diagnosis Date  . Coronary artery disease     Status post PTCA and stenting of his right coronary artery in March, 2008. He also has a 40-50% LAD stenosis  . LAD stenosis     MODERATE BETWEEN 50-60%  . Hypertension   . Chest pain   . 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 Cardiac catheterization 08/23/2006    EF 65%. THERE IS INFEROBASILAR AKINESIS  . Coronary angioplasty with stent placement     STENTING OF HIS RCA  . Cardiovascular stress test 04/21/2010    EF 51%. NO SIGNIFICANT ST SEGMENT CHANGE SUGGESTIVE OF ISCHEMIA    History  Smoking status  . Current Every Day Smoker  Smokeless tobacco  . Not on file    History  Alcohol Use No    No family history on file.  Reviw of Systems:  Reviewed in the HPI.  All other systems are negative.  Physical Exam: BP 166/100  Pulse 77  Ht 5'  11" (1.803 m)  Wt 158 lb 12.8 oz (72.031 kg)  BMI 22.15 kg/m2  SpO2 98% The patient is alert and oriented x 3.  The mood and affect are normal.   Skin: warm and dry.  Color is normal.    HEENT:   the sclera are nonicteric.  The mucous membranes are moist.  The carotids are 2+ without bruits.  There is no thyromegaly.  There is no JVD.    Lungs: clear.  The chest wall is non tender.    Heart: regular rate with a normal S1 and S2.  There are no murmurs, gallops, or rubs. The PMI is not displaced.     Abdomen: good bowel sounds.  There is no guarding or rebound.  There is no hepatosplenomegaly or tenderness.  There are no masses.   Extremities:  no clubbing, cyanosis, or edema.  The legs are without rashes.  The distal pulses are intact.   Neuro:  Cranial nerves II - XII are intact.  Motor and  sensory functions are intact.    The gait is normal.  ECG:  Assessment / Plan:

## 2012-07-18 NOTE — Assessment & Plan Note (Signed)
His blood pressure is fairly elevated today. This because he's been out of his blood pressure medications. I've encouraged him to check out the prices at other pharmacies to see if he can get his medications cheaper. We have him on generic medications that should be on the  $ 4   list.

## 2012-08-01 ENCOUNTER — Encounter: Payer: Self-pay | Admitting: Cardiovascular Disease

## 2012-08-22 ENCOUNTER — Other Ambulatory Visit: Payer: Self-pay | Admitting: *Deleted

## 2012-08-22 MED ORDER — HYDROCHLOROTHIAZIDE 25 MG PO TABS: 25.0000 mg | ORAL_TABLET | Freq: Every day | ORAL | Status: DC

## 2012-08-22 NOTE — Telephone Encounter (Signed)
Fax Received. Refill Completed. Octavia Chowoe (R.M.A)   

## 2013-01-20 ENCOUNTER — Encounter: Payer: Self-pay | Admitting: Cardiovascular Disease

## 2013-01-20 ENCOUNTER — Ambulatory Visit (INDEPENDENT_AMBULATORY_CARE_PROVIDER_SITE_OTHER): Payer: Self-pay | Admitting: Cardiovascular Disease

## 2013-01-20 VITALS — BP 160/94 | HR 75 | Ht 71.0 in | Wt 156.8 lb

## 2013-01-20 DIAGNOSIS — I2510 Atherosclerotic heart disease of native coronary artery without angina pectoris: Secondary | ICD-10-CM

## 2013-01-20 DIAGNOSIS — E876 Hypokalemia: Secondary | ICD-10-CM

## 2013-01-20 DIAGNOSIS — I10 Essential (primary) hypertension: Secondary | ICD-10-CM

## 2013-01-20 DIAGNOSIS — E785 Hyperlipidemia, unspecified: Secondary | ICD-10-CM

## 2013-01-20 LAB — LIPID PANEL
Cholesterol: 205 mg/dL — ABNORMAL HIGH (ref 0–200)
HDL: 49.4 mg/dL (ref >39.00)
Triglycerides: 104 mg/dL (ref 0.0–149.0)

## 2013-01-20 LAB — HEPATIC FUNCTION PANEL
ALT: 18 U/L (ref 0–53)
Albumin: 4.2 g/dL (ref 3.5–5.2)
Total Protein: 7.2 g/dL (ref 6.0–8.3)

## 2013-01-20 LAB — BASIC METABOLIC PANEL
CO2: 32 mEq/L (ref 19–32)
Calcium: 9.4 mg/dL (ref 8.4–10.5)
GFR: 95.74 mL/min (ref >60.00)
Sodium: 138 mEq/L (ref 135–145)

## 2013-01-20 LAB — LDL CHOLESTEROL, DIRECT: Direct LDL: 137.2 mg/dL

## 2013-01-20 MED ORDER — POTASSIUM CHLORIDE CRYS ER 10 MEQ PO TBCR: 20.0000 meq | EXTENDED_RELEASE_TABLET | Freq: Every day | ORAL | Status: DC

## 2013-01-20 MED ORDER — HYDROCHLOROTHIAZIDE 25 MG PO TABS: 25.0000 mg | ORAL_TABLET | Freq: Every day | ORAL | Status: DC

## 2013-01-20 MED ORDER — SIMVASTATIN 40 MG PO TABS: 40.0000 mg | ORAL_TABLET | Freq: Every evening | ORAL | Status: DC

## 2013-01-20 NOTE — Assessment & Plan Note (Signed)
His blood pressure is a little elevated today. He has not been taking his HCTZ. Addition, he worked a 16 hour shift last night and is somewhat sleep deprived.  I've encouraged him to stay away from eating any extra salt. I have encouraged him to   take his medications as prescribed.  I'll see him again in 6 months.

## 2013-01-20 NOTE — Patient Instructions (Signed)
Your physician recommends that you return for a FASTING lipid profile: today and in 6 months   Your physician wants you to follow-up in: 6 months You will receive a reminder letter in the mail two months in advance. If you don't receive a letter, please call our office to schedule the follow-up appointment.  Please take your meds as prescribed, please avoid high sodium foods like,  REDUCE HIGH SODIUM FOODS LIKE CANNED SOUP, GRAVY, SAUCES, READY PREPARED FOODS LIKE FROZEN FOODS; LEAN CUISINE, LASAGNA. BACON, SAUSAGE, LUNCH MEAT, FAST FOODS, HOT DOGS, CHIPS, PIZZA.

## 2013-01-20 NOTE — Assessment & Plan Note (Signed)
Lloyd seems to be doing well. He's not having episodes of chest pain. We'll check fasting lipids, pacing metabolic profile, and hepatic profile today. We'll check these labs again when I see him again in 6 months.  Once again we've discussed a low salt diet.

## 2013-01-20 NOTE — Progress Notes (Signed)
[place] [facility] [address], [address] [place], [place]  [address]  [place] [facility] [address] [place], [place]  [address]  Javier Rios Date of Birth  September 17, 1950  Problem list: 1. History of coronary artery disease-status post PTCA and stenting of his right coronary artery-08/23/2006 he has moderate disease involving his LAD. 2. Hypertension 3. Hyperlipidemia  History of Present Illness:  62 year old gentleman with a history of coronary artery disease. He is status post PTCA and stenting of his right coronary artery. He has a moderate stenosis in his LAD.  His last stress Myoview study was November, 2011. There is no evidence of anterior wall ischemia. He has a mild defect in his inferior wall which corresponds to his previous inferior basilar myocardial infarction.  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  He is not having any chest pain.  He has trouble with getting his meds.  He is eating lots of bologna because it is inexpensive.  His power to his apartment is off and he is unable to eat regular food.  He has been going to [employer] and eating chicken biscuits.    Feb. 6, 2014:  He has stopped taking all medications. He states he is not able to afford them. 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  He ran out of his meds.  He is still eating lots of salty foods ( bologna, fast foods)  January 20, 2013:  Javier Rios is staying active.  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  No CP.   Has not been taking his HCTZ or zocor due to cost.  He is still eating fast foods - although he is trying to cut back.    Current Outpatient Prescriptions on File Prior to Visit  Medication Sig Dispense Refill  . aspirin 81 MG tablet Take 81 mg by mouth daily.        . hydrochlorothiazide (HYDRODIURIL) 25 MG tablet Take 1 tablet (25 mg total) by mouth daily.  30 tablet  5  . metoprolol tartrate  (LOPRESSOR) 25 MG tablet One half tablet (12.5 mg) twice daily- 12 hours apart  30 tablet  5  . nitroGLYCERIN (NITROSTAT) 0.4 MG SL tablet Place 1 tablet (0.4 mg total) under the tongue every 5 (five) minutes as needed.  25 tablet  5  . potassium chloride (K-DUR,KLOR-CON) 10 MEQ tablet Take 2 tablets (20 mEq total) by mouth daily.  60 tablet  5  . simvastatin (ZOCOR) 40 MG tablet Take 1 tablet (40 mg total) by mouth every evening.  30 tablet  5   No current facility-administered medications on file prior to visit.    No Known Allergies  Past Medical History  Diagnosis Date  . Coronary artery disease     Status post PTCA and stenting of his right coronary artery in March, 2008. He also has a 40-50% LAD stenosis  . LAD stenosis     MODERATE BETWEEN 50-60%  . Hypertension   . Chest pain   . 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 Cardiac catheterization  08/23/2006    EF 65%. THERE IS INFEROBASILAR AKINESIS  . Coronary angioplasty with stent placement      STENTING OF HIS RCA  . Cardiovascular stress test  04/21/2010    EF 51%. NO SIGNIFICANT ST  SEGMENT CHANGE SUGGESTIVE OF ISCHEMIA    History  Smoking status  . Current Every Day Smoker  Smokeless tobacco  . Not on file    History  Alcohol Use No    No family history on file.  Reviw of Systems:  Reviewed in the HPI.  All other systems are negative.  Physical Exam: BP 160/94  Pulse 75  Ht 5\' 11"  (1.803 m)  Wt 156 lb 12.8 oz (71.124 kg)  BMI 21.88 kg/m2 The patient is alert and oriented x 3.  The mood and affect are normal.   Skin: warm and dry.  Color is normal.    HEENT:   the sclera are nonicteric.  The mucous membranes are moist.  The carotids are 2+ without bruits.  There is no thyromegaly.  There is no JVD.    Lungs: clear.  The chest wall is non tender.    Heart: regular rate with a normal S1 and S2.  There are no murmurs, gallops, or rubs. The PMI is not  displaced.     Abdomen: good bowel sounds.  There is no guarding or rebound.  There is no hepatosplenomegaly or tenderness.  There are no masses.   Extremities:  no clubbing, cyanosis, or edema.  The legs are without rashes.  The distal pulses are intact.   Neuro:  Cranial nerves II - XII are intact.  Motor and sensory functions are intact.    The gait is normal.  ECG: January 20, 2013:  NSR at 77, bi atrial enlargment.  Previous Inf. MI.   Assessment / Plan:

## 2013-01-23 ENCOUNTER — Telehealth: Payer: Self-pay | Admitting: Cardiovascular Disease

## 2013-01-23 NOTE — Telephone Encounter (Signed)
Patient called with lab results.Pt verbalized understanding. HE WAS NOT TAKING CHOLESTEROL MED NOR K+//SPENT 10 MINUTES EXPLAINING IMPORTANCE OF THESE MEDS.  LAB DATE GIVEN.

## 2013-01-23 NOTE — Telephone Encounter (Signed)
Follow Up     Pt calling to follow up on test results.

## 2013-02-19 ENCOUNTER — Ambulatory Visit (INDEPENDENT_AMBULATORY_CARE_PROVIDER_SITE_OTHER): Payer: Self-pay | Admitting: *Deleted

## 2013-02-19 ENCOUNTER — Telehealth: Payer: Self-pay | Admitting: Cardiovascular Disease

## 2013-02-19 DIAGNOSIS — I2510 Atherosclerotic heart disease of native coronary artery without angina pectoris: Secondary | ICD-10-CM

## 2013-02-19 DIAGNOSIS — I10 Essential (primary) hypertension: Secondary | ICD-10-CM

## 2013-02-19 LAB — BASIC METABOLIC PANEL
CO2: 29 mEq/L (ref 19–32)
Chloride: 100 mEq/L (ref 96–112)
Potassium: 3.7 mEq/L (ref 3.5–5.1)
Sodium: 136 mEq/L (ref 135–145)

## 2013-02-19 MED ORDER — NITROGLYCERIN 0.4 MG SL SUBL: 0.4000 mg | SUBLINGUAL_TABLET | SUBLINGUAL | Status: DC | PRN

## 2013-02-19 NOTE — Telephone Encounter (Signed)
Pt came in for labs and stated he had been have some left sided chest discomfort off and on since starting the Simvastitin.  He had same symptoms the last time he was on this.  He would like some Nitro called into his pharmacy to see if this will help.  [facility]  Please call patient if any questions.

## 2013-02-19 NOTE — Telephone Encounter (Signed)
MSG STATES; THE CUSTOMER IS NOT AVAILABLE EITHER BECAUSE THE PHONE IS OFF OR OUT OF SERVICE RANGE/ UNABLE TO LEAVE MSG.

## 2013-02-19 NOTE — Telephone Encounter (Signed)
Trying to reach him x 2 to discuss his symptoms. In the meantime his nitro was refilled. I will continue to try.

## 2013-02-20 NOTE — Telephone Encounter (Signed)
Pt c/o chest pain with exersion lasting 1-2 minutes left chest, started about 2 weeks ago and occurs every 3-4 days. Pt mowed lawn today without pain, Denies sob/nausea.  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 Pt was informed of importance of further testing and told to go to ED if nitro use does not help, he agreed to plan and will wait to hear price. I called and left a MSG for Baron Sane to call me with a price. Spoke with [employer] / [facility] , price is greater than 5000.00 I will give him the number for billing at the hospital to see if they can guide him to some kind of assistance.  Pt was informed/ number provided to billing dept/ cone. Pt will call when needed.

## 2013-07-28 ENCOUNTER — Telehealth: Payer: Self-pay | Admitting: *Deleted

## 2013-07-28 DIAGNOSIS — E785 Hyperlipidemia, unspecified: Secondary | ICD-10-CM

## 2013-07-28 NOTE — Telephone Encounter (Signed)
Message copied by Antony OdeaBRILEY, [place] Huberty J on Mon Jul 28, 2013 12:07 PM ------      Message from: Marylou MccoyBROWN, BRANDON A      Created: Tue Jul 22, 2013  4:50 PM      Regarding: Lab Orders       Hey,            Pt needs lab orders for upcoming appt on 2/24.            Thanks,            Apolinar JunesBrandon ------

## 2013-07-28 NOTE — Telephone Encounter (Signed)
LAB ORDERS PLACED

## 2013-08-05 ENCOUNTER — Ambulatory Visit: Payer: Self-pay | Admitting: Cardiovascular Disease

## 2013-08-05 ENCOUNTER — Other Ambulatory Visit: Payer: Self-pay

## 2013-09-05 ENCOUNTER — Other Ambulatory Visit: Payer: Self-pay

## 2013-09-05 ENCOUNTER — Ambulatory Visit (INDEPENDENT_AMBULATORY_CARE_PROVIDER_SITE_OTHER): Payer: Self-pay | Admitting: Cardiovascular Disease

## 2013-09-05 ENCOUNTER — Encounter: Payer: Self-pay | Admitting: Cardiovascular Disease

## 2013-09-05 VITALS — BP 136/90 | HR 64 | Ht 71.0 in | Wt 152.2 lb

## 2013-09-05 DIAGNOSIS — J449 Chronic obstructive pulmonary disease, unspecified: Secondary | ICD-10-CM | POA: Insufficient documentation

## 2013-09-05 DIAGNOSIS — I2510 Atherosclerotic heart disease of native coronary artery without angina pectoris: Secondary | ICD-10-CM

## 2013-09-05 DIAGNOSIS — I10 Essential (primary) hypertension: Secondary | ICD-10-CM

## 2013-09-05 MED ORDER — SILDENAFIL CITRATE 50 MG PO TABS: 50.0000 mg | ORAL_TABLET | Freq: Every day | ORAL | Status: DC | PRN

## 2013-09-05 MED ORDER — ALBUTEROL SULFATE HFA 108 (90 BASE) MCG/ACT IN AERS: 2.0000 | INHALATION_SPRAY | Freq: Four times a day (QID) | RESPIRATORY_TRACT | Status: DC | PRN

## 2013-09-05 NOTE — Assessment & Plan Note (Signed)
He states his shortness breath with exertion. He's not clear whether the shortness breath is definitely related to exertion or not. Is not severe enough that he wants to do a stress test. We've offereded to perform a stress test on him several times but he declines because of the cost.  Once again I strongly encouraged him to stop smoking which will reduce his risk of developing further coronary artery problems.  I have asked him to call me if he has any further worsening of his chest discomfort

## 2013-09-05 NOTE — Assessment & Plan Note (Signed)
His blood pressures fairly well controlled. Continue current medications.

## 2013-09-05 NOTE — Patient Instructions (Signed)
Refill Pro Air inhaler    Your physician wants you to follow-up in: 6 months. You will receive a reminder letter in the mail two months in advance. If you don't receive a letter, please call our office to schedule the follow-up appointment.

## 2013-09-05 NOTE — Assessment & Plan Note (Signed)
We will refill his ProAir inhaler. I've encouraged him to stop smoking.

## 2013-09-05 NOTE — Progress Notes (Signed)
[facility] [address] [place], [place]  [id number]  [place] [facility] [address] [place], [place]  [id number]  Javier PriestlyJames Venturini Date of Birth  [birth year]  Problem list: 1. History of coronary artery disease-status post PTCA and stenting of his right coronary artery-08/23/2006 he has moderate disease involving his LAD. 2. Hypertension 3. Hyperlipidemia  History of Present Illness:  63 year old gentleman with a history of coronary artery disease. He is status post PTCA and stenting of his right coronary artery. He has a moderate stenosis in his LAD.  His last stress Myoview study was November, 2011. There is no evidence of anterior wall ischemia. He has a mild defect in his inferior wall which corresponds to his previous inferior basilar myocardial infarction.  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  He is not having any chest pain.  He has trouble with getting his meds.  He is eating lots of bologna because it is inexpensive.  His power to his apartment is off and he is unable to eat regular food.  He has been going to [employer] and eating chicken biscuits.    Feb. 6, 2014:  He has stopped taking all medications. He states he is not able to afford them. 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  He ran out of his meds.  He is still eating lots of salty foods ( bologna, fast foods)  January 20, 2013:  Javier Rios is staying active.  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  No CP.   Has not been taking his HCTZ or zocor due to cost.  He is still eating fast foods - although he is trying to cut back.   September 05, 2013:  Javier Rios is still doing well.  Still working 2 jobs - he has been having some indigestion - started taking gaviscon which helps.    He has had some dyspnea .  He has not wanted to have a stress test due to cost.  His insurance does not cover much.    He walks at work  and typically is able to get to his work day without too much trouble.   Current Outpatient Prescriptions on File Prior to Visit  Medication Sig Dispense Refill  . aspirin 81 MG tablet Take 81 mg by mouth daily.        . hydrochlorothiazide (HYDRODIURIL) 25 MG tablet Take 1 tablet (25 mg total) by mouth daily.  30 tablet  5  . metoprolol tartrate (LOPRESSOR) 25 MG tablet One half tablet (12.5 mg) twice daily- 12 hours apart  30 tablet  5  . nitroGLYCERIN (NITROSTAT) 0.4 MG SL tablet Place 1 tablet (0.4 mg total) under the tongue every 5 (five) minutes as needed.  25 tablet  5  . potassium chloride (K-DUR,KLOR-CON) 10 MEQ tablet Take 2 tablets (20 mEq total) by mouth daily.  60 tablet  5   No current facility-administered medications on file prior to visit.    No Known Allergies  Past Medical History  Diagnosis Date  . Coronary artery disease     Status post PTCA and stenting of his right coronary artery in March, 2008. He also has a 40-50% LAD stenosis  . LAD stenosis     MODERATE BETWEEN 50-60%  . Hypertension   . Chest pain   . 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 Cardiac catheterization  08/23/2006    EF 65%. THERE IS INFEROBASILAR AKINESIS  . Coronary angioplasty with stent placement      STENTING OF HIS RCA  . Cardiovascular stress test  04/21/2010    EF 51%. NO SIGNIFICANT ST SEGMENT CHANGE SUGGESTIVE OF ISCHEMIA    History  Smoking status  . Current Every Day Smoker  Smokeless tobacco  . Not on file    History  Alcohol Use No    No family history on file.  Reviw of Systems:  Reviewed in the HPI.  All other systems are negative.  Physical Exam: BP 136/90  Pulse 64  Ht 5\' 11"  (1.803 m)  Wt 152 lb 3.2 oz (69.037 kg)  BMI 21.24 kg/m2 The patient is alert and oriented x 3.  The mood and affect are normal.   Skin: warm and dry.  Color is normal.    HEENT:   the sclera are nonicteric.  The mucous membranes are  moist.  The carotids are 2+ without bruits.  There is no thyromegaly.  There is no JVD.    Lungs: clear.  The chest wall is non tender.    Heart: regular rate with a normal S1 and S2.  There are no murmurs, gallops, or rubs. The PMI is not displaced.     Abdomen: good bowel sounds.  There is no guarding or rebound.  There is no hepatosplenomegaly or tenderness.  There are no masses.   Extremities:  no clubbing, cyanosis, or edema.  The legs are without rashes.  The distal pulses are intact.   Neuro:  Cranial nerves II - XII are intact.  Motor and sensory functions are intact.    The gait is normal.  ECG: January 20, 2013:  NSR at 77, bi atrial enlargment.  Previous Inf. MI.   Assessment / Plan:

## 2013-09-11 ENCOUNTER — Telehealth: Payer: Self-pay | Admitting: Cardiovascular Disease

## 2013-09-11 MED ORDER — SILDENAFIL CITRATE 20 MG PO TABS: ORAL_TABLET | ORAL | Status: DC

## 2013-09-11 NOTE — Telephone Encounter (Signed)
F/u ° ° °Pt returning your call. Please call pt. °

## 2013-09-11 NOTE — Telephone Encounter (Signed)
lmtcb

## 2013-09-11 NOTE — Telephone Encounter (Signed)
Follow up ° ° ° ° °Returning a nurses call °

## 2013-09-11 NOTE — Telephone Encounter (Signed)
Script written for signature 

## 2013-09-11 NOTE — Telephone Encounter (Signed)
Will be fine with me 

## 2013-09-11 NOTE — Telephone Encounter (Signed)
New Message  Pt called requests a call back to discuss changing his Rx for Viagra.. the drug store fills the script for 20 mg.. but the script was written for 50 mg..Pt states he was advised to have the script changed.. Please call back to discuss.

## 2013-09-11 NOTE — Telephone Encounter (Signed)
I spoke with [facility] [place] [place]. If you will prescribe Revatio 20 mg to take 2 tablets prn/ pt will get a much better price because it is generic. Pt will pick up script if you will ok it 09/12/13 afternoon

## 2013-09-12 NOTE — Telephone Encounter (Signed)
TAKEN TO FRONT DESK FOR PICK UP.

## 2013-11-29 ENCOUNTER — Other Ambulatory Visit: Payer: Self-pay | Admitting: Cardiovascular Disease

## 2014-01-20 ENCOUNTER — Other Ambulatory Visit: Payer: Self-pay | Admitting: Cardiovascular Disease

## 2014-03-03 ENCOUNTER — Ambulatory Visit: Payer: Self-pay | Admitting: Cardiovascular Disease

## 2014-03-04 ENCOUNTER — Other Ambulatory Visit: Payer: Self-pay | Admitting: Cardiovascular Disease

## 2014-03-30 ENCOUNTER — Ambulatory Visit: Payer: Self-pay | Admitting: Cardiovascular Disease

## 2014-04-17 ENCOUNTER — Other Ambulatory Visit: Payer: Self-pay | Admitting: Cardiovascular Disease

## 2014-04-21 ENCOUNTER — Encounter: Payer: Self-pay | Admitting: Cardiovascular Disease

## 2014-04-21 ENCOUNTER — Ambulatory Visit (INDEPENDENT_AMBULATORY_CARE_PROVIDER_SITE_OTHER): Payer: Self-pay | Admitting: Cardiovascular Disease

## 2014-04-21 VITALS — BP 166/108 | HR 72 | Ht 71.0 in | Wt 151.1 lb

## 2014-04-21 DIAGNOSIS — I2510 Atherosclerotic heart disease of native coronary artery without angina pectoris: Secondary | ICD-10-CM

## 2014-04-21 DIAGNOSIS — I10 Essential (primary) hypertension: Secondary | ICD-10-CM

## 2014-04-21 MED ORDER — AMLODIPINE BESYLATE 5 MG PO TABS: 5.0000 mg | ORAL_TABLET | Freq: Every day | ORAL | Status: DC

## 2014-04-21 MED ORDER — CARVEDILOL 3.125 MG PO TABS: 3.1250 mg | ORAL_TABLET | Freq: Two times a day (BID) | ORAL | Status: DC

## 2014-04-21 MED ORDER — SILDENAFIL CITRATE 20 MG PO TABS: ORAL_TABLET | ORAL | Status: DC

## 2014-04-21 NOTE — Assessment & Plan Note (Signed)
Javier Rios continues to have problems with high blood pressure. He has not actually taken her metoprolol. We will start him on amlodipine 5 mg a day. He continued the HCTZ and potassium today. I would also like to start him on carvedilol 3.125 mg twice a day. We will have him see one of the nurses for a nurse visit in one month. I'll see him in 6 months-sooner if needed.

## 2014-04-21 NOTE — Patient Instructions (Signed)
Your physician has recommended you make the following change in your medication:  START Amlodipine 5 mg once daily START Coreg (Carvedilol) 3.125 mg twice daily  Your physician recommends that you schedule a follow-up appointment in: 1 month with Nurse for BP check  Your physician wants you to follow-up in: 6 months with Dr. Elease HashimotoNahser.  You will receive a reminder letter in the mail two months in advance. If you don't receive a letter, please call our office to schedule the follow-up appointment.

## 2014-04-21 NOTE — Assessment & Plan Note (Signed)
He walks approximately 5 miles every day. He has occasional episodes of musculoskeletal pain but has not had any angina pain. I've advised him to call me soon if he has any anginal like pain. We will be adding carvedilol 3.125 mg twice a day.  I will see him in 6 months.

## 2014-04-21 NOTE — Progress Notes (Signed)
[facility] [address] [place], [place]  [address]  [facility] [address] [place], [place]  [id number]  Darlin PriestlyJames Barnier Date of Birth  08-30-1950  Problem list: 1. History of coronary artery disease-status post PTCA and stenting of his right coronary artery-08/23/2006 he has moderate disease involving his LAD. 2. Hypertension 3. Hyperlipidemia  History of Present Illness:  63 year old gentleman with a history of coronary artery disease. He is status post PTCA and stenting of his right coronary artery. He has a moderate stenosis in his LAD.  His last stress Myoview study was November, 2011. There is no evidence of anterior wall ischemia. He has a mild defect in his inferior wall which corresponds to his previous inferior basilar myocardial infarction.  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  He is not having any chest pain.  He has trouble with getting his meds.  He is eating lots of bologna because it is inexpensive.  His power to his apartment is off and he is unable to eat regular food.  He has been going to [employer] and eating chicken biscuits.    Feb. 6, 2014:  He has stopped taking all medications. He states he is not able to afford them. 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  He ran out of his meds.  He is still eating lots of salty foods ( bologna, fast foods)  January 20, 2013:  [name] is staying active.  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  No CP.   Has not been taking his HCTZ or zocor due to cost.  He is still eating fast foods - although he is trying to cut back.   September 05, 2013:  [name] is still doing well.  Still working 2 jobs - he has been having some indigestion - started taking gaviscon which helps.    He has had some dyspnea .  He has not wanted to have a stress test due to cost.  His insurance does not cover much.    He walks at work  and typically is able to get to his work day without too much trouble.  Nov. 10, 2015:  [name] is seen today for followup of his coronary artery disease and hypertension. His blood pressure is a little elevated today. He stayed up last night and watched the football game.  He also ate some fried chicken which probably had some extra salt in it. His BP has been elevated for the past several office visits.   Current Outpatient Prescriptions on File Prior to Visit  Medication Sig Dispense Refill  . albuterol (PROAIR HFA) 108 (90 BASE) MCG/ACT inhaler Inhale 2 puffs into the lungs every 6 (six) hours as needed for wheezing or shortness of breath. 1 Inhaler 6  . aspirin 81 MG tablet Take 81 mg by mouth daily.      . hydrochlorothiazide (HYDRODIURIL) 25 MG tablet TAKE 1 TABLET BY MOUTH EVERY DAY 30 tablet 1  . KLOR-CON 10 10 MEQ tablet TAKE 2 TABLETS BY MOUTH EVERY DAY 60 tablet 0  . metoprolol tartrate (LOPRESSOR) 25 MG tablet One half tablet (12.5 mg) twice daily- 12 hours apart 30 tablet 5  . nitroGLYCERIN (NITROSTAT) 0.4 MG SL tablet Place 1 tablet (0.4 mg total) under the tongue every 5 (five) minutes as needed. 25 tablet 5  . potassium chloride (K-DUR,KLOR-CON)  10 MEQ tablet Take 2 tablets (20 mEq total) by mouth daily. 60 tablet 5  . sildenafil (REVATIO) 20 MG tablet Take 2 tablets daily as needed for erectile dysfunction. 50 tablet 3   No current facility-administered medications on file prior to visit.    No Known Allergies  Past Medical History  Diagnosis Date  . Coronary artery disease     Status post PTCA and stenting of his right coronary artery in March, 2008. He also has a 40-50% LAD stenosis  . LAD stenosis     MODERATE BETWEEN 50-60%  . Hypertension   . Chest pain   . 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 Cardiac catheterization  08/23/2006    EF 65%. THERE IS INFEROBASILAR AKINESIS  . Coronary angioplasty with stent  placement      STENTING OF HIS RCA  . Cardiovascular stress test  04/21/2010    EF 51%. NO SIGNIFICANT ST SEGMENT CHANGE SUGGESTIVE OF ISCHEMIA    History  Smoking status  . Current Every Day Smoker  Smokeless tobacco  . Not on file    History  Alcohol Use No    No family history on file.  Reviw of Systems:  Reviewed in the HPI.  All other systems are negative.  Physical Exam: BP 166/108 mmHg  Pulse 64  Ht 5\' 11"  (1.803 m)  Wt 151 lb 1.9 oz (68.548 kg)  BMI 21.09 kg/m2 The patient is alert and oriented x 3.  The mood and affect are normal.   Skin: warm and dry.  Color is normal.    HEENT:   the sclera are nonicteric.  The mucous membranes are moist.  The carotids are 2+ without bruits.  There is no thyromegaly.  There is no JVD.    Lungs: clear.  The chest wall is non tender.    Heart: regular rate with a normal S1 and S2.  There are no murmurs, gallops, or rubs. The PMI is not displaced.     Abdomen: good bowel sounds.  There is no guarding or rebound.  There is no hepatosplenomegaly or tenderness.  There are no masses.   Extremities:  no clubbing, cyanosis, or edema.  The legs are without rashes.  The distal pulses are intact.   Neuro:  Cranial nerves II - XII are intact.  Motor and sensory functions are intact.    The gait is normal.  ECG: Nov. 10, 2015:  NSR at [age], previous Inf. MI.   Assessment / Plan:

## 2014-05-25 ENCOUNTER — Telehealth: Payer: Self-pay | Admitting: *Deleted

## 2014-05-25 NOTE — Telephone Encounter (Signed)
Pt in for nurse visit but he has not started his medications Amlodipine  5 mg and Carvedilol 3.125 mg due to cost.  States one of them is $95.00 and [facility]. States after the first of the year he will have insurance and will be able to afford them. BP today 152/100 HR 72; wt 151.4 lbs.  He states he [facility] ago and was 141/90. Advised him to take BP a couple of times at week at [facility] or fire dept. Will not charge him nurse visit (per Almira CoasterGina) since he had not started his medication due to cost. After he left spoke w/ Ronie Spiesayna Dunn.PA/flex for other alternatives.  She states [facility] and [facility] can get 4.00 prescriptions.  Looked on web site for [facility] and  the Amlodipine is $7 and Coreg is $4.  Another suggestion was by Lorenza Chickory Barnett, hospice coordinator that he probably would qualify for assistance with his diagnosis. Lm for pt to call us to give him these suggestions.  If goes on web site GoodRx.com can find places that have cheaper drug costs. Also [facility] has coupon he can print out.

## 2014-05-26 ENCOUNTER — Telehealth: Payer: Self-pay | Admitting: *Deleted

## 2014-05-26 MED ORDER — AMLODIPINE BESYLATE 5 MG PO TABS: 5.0000 mg | ORAL_TABLET | Freq: Every day | ORAL | Status: DC

## 2014-05-26 MED ORDER — CARVEDILOL 3.125 MG PO TABS: 3.1250 mg | ORAL_TABLET | Freq: Two times a day (BID) | ORAL | Status: DC

## 2014-05-26 NOTE — Telephone Encounter (Signed)
Called in follow up from yesterday.  Advised that both his BP meds are cheaper at [facility].  The Amlodipine 5 mg for 30 days will be 14.95; and carvedilol 3.125 mg BID will be $4 for 30 days for today. Starting tomorrow [facility]/16 [facility] and will be their charges. Spoke w/Dr. Elease HashimotoNahser regarding his BP yesterday and that he hadn't started his medications due to cost. He wants him to come get his BP checked 2 wks after starting the medication.  Made him an app for 06/15/14 at 10 for nurse visit for BP check.

## 2014-06-02 ENCOUNTER — Telehealth: Payer: Self-pay | Admitting: Cardiovascular Disease

## 2014-06-02 MED ORDER — AMLODIPINE BESYLATE 5 MG PO TABS: 5.0000 mg | ORAL_TABLET | Freq: Every day | ORAL | Status: DC

## 2014-06-02 MED ORDER — CARVEDILOL 3.125 MG PO TABS: 3.1250 mg | ORAL_TABLET | Freq: Two times a day (BID) | ORAL | Status: DC

## 2014-06-02 NOTE — Telephone Encounter (Signed)
I spoke with the pt and [facility] [place] [facility] Rx on Amlodipine and Carvedilol. I made the pt aware that these prescriptions were sent [facility] on [address]. The pt requested that I send Rx to [facility] so that he can see how much they are going to cost.  Prescriptions sent.  The [facility] will call [facility] to get info on cost of medications.

## 2014-06-02 NOTE — Telephone Encounter (Signed)
New Message  Pt returning [name] phone call. Pt was to pick up new medication from pharmacy, but they have not received prescription. Please call back and discuss.

## 2014-06-02 NOTE — Telephone Encounter (Signed)
Left message on machine for pt to contact the office.   

## 2014-10-09 ENCOUNTER — Ambulatory Visit (INDEPENDENT_AMBULATORY_CARE_PROVIDER_SITE_OTHER): Payer: Self-pay | Admitting: Cardiovascular Disease

## 2014-10-09 ENCOUNTER — Encounter: Payer: Self-pay | Admitting: Cardiovascular Disease

## 2014-10-09 VITALS — BP 160/90 | HR 70 | Ht 71.0 in | Wt 156.4 lb

## 2014-10-09 DIAGNOSIS — E785 Hyperlipidemia, unspecified: Secondary | ICD-10-CM | POA: Insufficient documentation

## 2014-10-09 DIAGNOSIS — I10 Essential (primary) hypertension: Secondary | ICD-10-CM

## 2014-10-09 DIAGNOSIS — I2510 Atherosclerotic heart disease of native coronary artery without angina pectoris: Secondary | ICD-10-CM

## 2014-10-09 LAB — LIPID PANEL
Cholesterol: 180 mg/dL (ref 0–200)
HDL: 40.6 mg/dL (ref >39.00)
LDL Cholesterol: 105 mg/dL — ABNORMAL HIGH (ref 0–99)
NONHDL: 139.4
TRIGLYCERIDES: 171 mg/dL — AB (ref 0.0–149.0)
Total CHOL/HDL Ratio: 4
VLDL: 34.2 mg/dL (ref 0.0–40.0)

## 2014-10-09 LAB — HEPATIC FUNCTION PANEL
ALBUMIN: 3.9 g/dL (ref 3.5–5.2)
ALT: 20 U/L (ref 0–53)
AST: 26 U/L (ref 0–37)
Alkaline Phosphatase: 66 U/L (ref 39–117)
Bilirubin, Direct: 0.1 mg/dL (ref 0.0–0.3)
TOTAL PROTEIN: 6.5 g/dL (ref 6.0–8.3)
Total Bilirubin: 0.5 mg/dL (ref 0.2–1.2)

## 2014-10-09 LAB — BASIC METABOLIC PANEL
BUN: 10 mg/dL (ref 6–23)
CHLORIDE: 105 meq/L (ref 96–112)
CO2: 27 mEq/L (ref 19–32)
Calcium: 9 mg/dL (ref 8.4–10.5)
Creatinine, Ser: 0.88 mg/dL (ref 0.40–1.50)
GFR: 112.87 mL/min (ref >60.00)
Glucose, Bld: 94 mg/dL (ref 70–99)
POTASSIUM: 3.7 meq/L (ref 3.5–5.1)
Sodium: 138 mEq/L (ref 135–145)

## 2014-10-09 NOTE — Patient Instructions (Signed)
Medication Instructions:  Your physician recommends that you continue on your current medications as directed. Please refer to the Current Medication list given to you today.   Labwork: TODAY - cholesterol, liver, basic metabolic panel  Testing/Procedures: None  Follow-Up: Your physician wants you to follow-up in: 6 months with Dr. Elease HashimotoNahser.  You will receive a reminder letter in the mail two months in advance. If you don't receive a letter, please call our office to schedule the follow-up appointment.

## 2014-10-09 NOTE — Progress Notes (Signed)
Cardiology Office Note   Date:  10/09/2014   ID:  Javier Rios Sumida, DOB Oct 27, 1950, MRN [id number]  PCP:  No PCP Per Patient  Cardiologist:   Vesta MixerNahser, Philip J, MD   Chief Complaint  Patient presents with  . Coronary Artery Disease   1. History of coronary artery disease-status post PTCA and stenting of his right coronary artery-08/23/2006 he has moderate disease involving his LAD. 2. Hypertension 3. Hyperlipidemia  History of Present Illness:  64 year old gentleman with a history of coronary artery disease. He is status post PTCA and stenting of his right coronary artery. He has a moderate stenosis in his LAD.  His last stress Myoview study was November, 2011. There is no evidence of anterior wall ischemia. He has a mild defect in his inferior wall which corresponds to his previous inferior basilar myocardial infarction.  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 He is not having any chest pain.  He has trouble with getting his meds. He is eating lots of bologna because it is inexpensive. His power to his apartment is off and he is unable to eat regular food. He has been going to [employer] and eating chicken biscuits.   Feb. 6, 2014:  He has stopped taking all medications. He states he is not able to afford them. 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 He ran out of his meds. He is still eating lots of salty foods ( bologna, fast foods)  January 20, 2013:  [name] is staying active. 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 No CP. Has not been taking his HCTZ or zocor due to cost. He is still eating fast foods - although he is trying to cut back.   September 05, 2013:  [name] is still doing well. Still working 2 jobs - he has been having some indigestion - started taking gaviscon which helps. He has had some dyspnea . He has not wanted to have a stress test due to cost. His insurance does not  cover much. He walks at work and typically is able to get to his work day without too much trouble.  Nov. 10, 2015:  [name] is seen today for followup of his coronary artery disease and hypertension. His blood pressure is a little elevated today. He stayed up last night and watched the football game. He also ate some fried chicken which probably had some extra salt in it. His BP has been elevated for the past several office visits.    April 29 , 2016:  Javier Rios Javier Rios is a 64 y.o. male who presents for follow-up of his coronary artery disease. BP is  Elevated this am .  He has been working all night.    He brought his BP log.  His readings are all ok. His blood pressure is typically in the 120/75 range. His heart rate is typically in the 70-80 range.   Past Medical History  Diagnosis Date  . Coronary artery disease     Status post PTCA and stenting of his right coronary artery in March, 2008. He also has a 40-50% LAD stenosis  . LAD stenosis     MODERATE BETWEEN 50-60%  . Hypertension   . Chest pain   . 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 Cardiac catheterization  08/23/2006    EF  65%. THERE IS INFEROBASILAR AKINESIS  . Coronary angioplasty with stent placement      STENTING OF HIS RCA  . Cardiovascular stress test  04/21/2010    EF 51%. NO SIGNIFICANT ST SEGMENT CHANGE SUGGESTIVE OF ISCHEMIA     Current Outpatient Prescriptions  Medication Sig Dispense Refill  . albuterol (PROAIR HFA) 108 (90 BASE) MCG/ACT inhaler Inhale 2 puffs into the lungs every 6 (six) hours as needed for wheezing or shortness of breath. 1 Inhaler 6  . amLODipine (NORVASC) 5 MG tablet Take 1 tablet (5 mg total) by mouth daily. 30 tablet 11  . aspirin 81 MG tablet Take 81 mg by mouth daily.      . carvedilol (COREG) 3.125 MG tablet Take 1 tablet (3.125 mg total) by mouth 2 (two) times daily. 60 tablet 11  . hydrochlorothiazide (HYDRODIURIL) 25 MG tablet TAKE  1 TABLET BY MOUTH EVERY DAY 30 tablet 1  . nitroGLYCERIN (NITROSTAT) 0.4 MG SL tablet Place 1 tablet (0.4 mg total) under the tongue every 5 (five) minutes as needed. 25 tablet 5  . potassium chloride (K-DUR,KLOR-CON) 10 MEQ tablet Take 2 tablets (20 mEq total) by mouth daily. 60 tablet 5  . sildenafil (REVATIO) 20 MG tablet Take 2 tablets daily as needed for erectile dysfunction. 50 tablet 3   No current facility-administered medications for this visit.    Allergies:   Review of patient's allergies indicates no known allergies.    Social History:  The patient  reports that he has been smoking.  He does not have any smokeless tobacco history on file. He reports that he does not drink alcohol or use illicit drugs.   Family History:  The patient's family history includes Hypertension in his mother.    ROS:  Please see the history of present illness.    Review of Systems: Constitutional:  denies fever, chills, diaphoresis, appetite change and fatigue.  HEENT: denies photophobia, eye pain, redness, hearing loss, ear pain, congestion, sore throat, rhinorrhea, sneezing, neck pain, neck stiffness and tinnitus.  Respiratory: denies SOB, DOE, cough, chest tightness, and wheezing.  Cardiovascular: denies chest pain, palpitations and leg swelling.  Gastrointestinal: denies nausea, vomiting, abdominal pain, diarrhea, constipation, blood in stool.  Genitourinary: denies dysuria, urgency, frequency, hematuria, flank pain and difficulty urinating.  Musculoskeletal: denies  myalgias, back pain, joint swelling, arthralgias and gait problem.   Skin: denies pallor, rash and wound.  Neurological: denies dizziness, seizures, syncope, weakness, light-headedness, numbness and headaches.   Hematological: denies adenopathy, easy bruising, personal or family bleeding history.  Psychiatric/ Behavioral: denies suicidal ideation, mood changes, confusion, nervousness, sleep disturbance and agitation.       All  other systems are reviewed and negative.    PHYSICAL EXAM: VS:  BP 160/90 mmHg  Pulse 70  Ht  (1.803 m)  Wt 156 lb 6.4 oz (70.943 kg)  BMI 21.82 kg/m2 , BMI Body mass index is 21.82 kg/(m^2). GEN: Well nourished, well developed, in no acute distress HEENT: normal Neck: no JVD, carotid bruits, or masses Cardiac: RRR; no murmurs, rubs, or gallops,no edema  Respiratory:  clear to auscultation bilaterally, normal work of breathing GI: soft, nontender, nondistended, + BS MS: no deformity or atrophy Skin: warm and dry, no rash Neuro:  Strength and sensation are intact Psych: normal   EKG:  EKG is not ordered today. The ekg ordered today demonstrates     Recent Labs: No results found for requested labs within last 365 days.    Lipid  Panel    Component Value Date/Time   CHOL 205* 01/20/2013 1016   TRIG 104.0 01/20/2013 1016   HDL 49.40 01/20/2013 1016   CHOLHDL 4 01/20/2013 1016   VLDL 20.8 01/20/2013 1016   LDLDIRECT 137.2 01/20/2013 1016      Wt Readings from Last 3 Encounters:  10/09/14 156 lb 6.4 oz (70.943 kg)  04/21/14 151 lb 1.9 oz (68.548 kg)  09/05/13 152 lb 3.2 oz (69.037 kg)      Other studies Reviewed: Additional studies/ records that were reviewed today include: . Review of the above records demonstrates:    ASSESSMENT AND PLAN:  1. History of coronary artery disease-status post PTCA and stenting of his right coronary artery-08/23/2006 he has moderate disease involving his LAD.   He is not having any angina  2. Hypertension-  BP readings are well controlled . A bit elevated today but this is likely because of the fast that he is just getting off work and is sleep deprived.   3. Hyperlipidemia - will get fasting labs today    Current medicines are reviewed at length with the patient today.  The patient does not have concerns regarding medicines.  The following changes have been made:  no change  Labs/ tests ordered today include:  No  orders of the defined types were placed in this encounter.     Disposition:   FU with me in 6 months.       Nahser, Deloris Ping, MD  10/09/2014 10:00 AM    [facility] [address] [place], [place], [place]  [address] Phone: [phone]; Fax: [phone]   [facility]  [address] [place], [place]  [address] [phone]    Fax [phone]

## 2014-10-15 ENCOUNTER — Encounter: Payer: Self-pay | Admitting: Nurse Practitioner

## 2014-10-22 ENCOUNTER — Telehealth: Payer: Self-pay | Admitting: Cardiovascular Disease

## 2014-10-22 DIAGNOSIS — E785 Hyperlipidemia, unspecified: Secondary | ICD-10-CM

## 2014-10-22 DIAGNOSIS — I2510 Atherosclerotic heart disease of native coronary artery without angina pectoris: Secondary | ICD-10-CM

## 2014-10-22 MED ORDER — ATORVASTATIN CALCIUM 20 MG PO TABS: 20.0000 mg | ORAL_TABLET | Freq: Every day | ORAL | Status: DC

## 2014-10-22 NOTE — Telephone Encounter (Signed)
-----   Message from Vesta MixerPhilip J Nahser, MD sent at 10/09/2014  5:37 PM EDT ----- Cholesterol is elevated.   Lets start Atorvastatin 20 a day .

## 2014-10-22 NOTE — Telephone Encounter (Signed)
Follow Up ° ° °Pt calling following up on blood work results. Please call. °

## 2014-10-22 NOTE — Telephone Encounter (Signed)
Reviewed lab results and plan of care to start Atorvastatin 20 mg and recheck labs in 6 months.  Patient verbalized understanding and agreement.  Orders in epic

## 2015-01-05 ENCOUNTER — Other Ambulatory Visit: Payer: Self-pay | Admitting: Nurse Practitioner

## 2015-01-05 ENCOUNTER — Other Ambulatory Visit: Payer: Self-pay

## 2015-01-05 MED ORDER — SILDENAFIL CITRATE 100 MG PO TABS: 100.0000 mg | ORAL_TABLET | Freq: Every day | ORAL | Status: DC | PRN

## 2015-04-13 ENCOUNTER — Encounter: Payer: Self-pay | Admitting: Cardiovascular Disease

## 2015-04-14 ENCOUNTER — Ambulatory Visit: Payer: Self-pay | Admitting: Cardiovascular Disease

## 2015-04-14 ENCOUNTER — Other Ambulatory Visit: Payer: Self-pay

## 2015-04-21 ENCOUNTER — Ambulatory Visit (INDEPENDENT_AMBULATORY_CARE_PROVIDER_SITE_OTHER): Payer: Self-pay | Admitting: Cardiovascular Disease

## 2015-04-21 ENCOUNTER — Other Ambulatory Visit (INDEPENDENT_AMBULATORY_CARE_PROVIDER_SITE_OTHER): Payer: Self-pay | Admitting: *Deleted

## 2015-04-21 ENCOUNTER — Encounter: Payer: Self-pay | Admitting: Cardiovascular Disease

## 2015-04-21 VITALS — BP 125/90 | HR 74 | Ht 71.0 in | Wt 153.8 lb

## 2015-04-21 DIAGNOSIS — E785 Hyperlipidemia, unspecified: Secondary | ICD-10-CM

## 2015-04-21 DIAGNOSIS — I10 Essential (primary) hypertension: Secondary | ICD-10-CM

## 2015-04-21 DIAGNOSIS — I2510 Atherosclerotic heart disease of native coronary artery without angina pectoris: Secondary | ICD-10-CM

## 2015-04-21 LAB — HEPATIC FUNCTION PANEL
ALBUMIN: 4 g/dL (ref 3.6–5.1)
ALT: 19 U/L (ref 9–46)
AST: 26 U/L (ref 10–35)
Alkaline Phosphatase: 73 U/L (ref 40–115)
BILIRUBIN INDIRECT: 0.4 mg/dL (ref 0.2–1.2)
BILIRUBIN TOTAL: 0.5 mg/dL (ref 0.2–1.2)
Bilirubin, Direct: 0.1 mg/dL (ref <=0.2)
TOTAL PROTEIN: 6.9 g/dL (ref 6.1–8.1)

## 2015-04-21 LAB — BASIC METABOLIC PANEL
BUN: 10 mg/dL (ref 7–25)
CHLORIDE: 101 mmol/L (ref 98–110)
CO2: 27 mmol/L (ref 20–31)
Calcium: 9.1 mg/dL (ref 8.6–10.3)
Creat: 1.1 mg/dL (ref 0.70–1.25)
Glucose, Bld: 97 mg/dL (ref 65–99)
POTASSIUM: 3.7 mmol/L (ref 3.5–5.3)
Sodium: 137 mmol/L (ref 135–146)

## 2015-04-21 LAB — LIPID PANEL
Cholesterol: 197 mg/dL (ref 125–200)
HDL: 45 mg/dL (ref >=40)
LDL CALC: 133 mg/dL — AB (ref <130)
TRIGLYCERIDES: 95 mg/dL (ref <150)
Total CHOL/HDL Ratio: 4.4 Ratio (ref <=5.0)
VLDL: 19 mg/dL (ref <30)

## 2015-04-21 NOTE — Addendum Note (Signed)
Addended by: Tonita PhoenixBOWDEN, Sohrab Keelan K on: 04/21/2015 11:34 AM   Modules accepted: Orders

## 2015-04-21 NOTE — Progress Notes (Signed)
Cardiology Office Note   Date:  04/21/2015   ID:  Javier Rios Rios, DOB [birth year], MRN [id number]  PCP:  No PCP Per Patient  Cardiologist:   Vesta MixerNahser, Philip J, MD   Chief Complaint  Patient presents with  . Follow-up   1. History of coronary artery disease-status post PTCA and stenting of his right coronary artery-08/23/2006 he has moderate disease involving his LAD. 2. Hypertension 3. Hyperlipidemia  History of Present Illness:  64 year old gentleman with a history of coronary artery disease. He is status post PTCA and stenting of his right coronary artery. He has a moderate stenosis in his LAD.  His last stress Myoview study was November, 2011. There is no evidence of anterior wall ischemia. He has a mild defect in his inferior wall which corresponds to his previous inferior basilar myocardial infarction.  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 He is not having any chest pain.  He has trouble with getting his meds. He is eating lots of bologna because it is inexpensive. His power to his apartment is off and he is unable to eat regular food. He has been going to [employer] and eating chicken biscuits.   Feb. 6, 2014:  He has stopped taking all medications. He states he is not able to afford them. 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 He ran out of his meds. He is still eating lots of salty foods ( bologna, fast foods)  January 20, 2013:  Javier Rios is staying active. 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 No CP. Has not been taking his HCTZ or zocor due to cost. He is still eating fast foods - although he is trying to cut back.   September 05, 2013:  Javier Rios is still doing well. Still working 2 jobs - he has been having some indigestion - started taking gaviscon which helps. He has had some dyspnea . He has not wanted to have a stress test due to cost. His insurance does not cover much. He  walks at work and typically is able to get to his work day without too much trouble.  Nov. 10, 2015:  Javier Rios is seen today for followup of his coronary artery disease and hypertension. His blood pressure is a little elevated today. He stayed up last night and watched the football game. He also ate some fried chicken which probably had some extra salt in it. His BP has been elevated for the past several office visits.    April 29 , 2016:  Javier Rios Soberanes is a 64 y.o. male who presents for follow-up of his coronary artery disease. BP is  Elevated this am .  He has been working all night.    He brought his BP log.  His readings are all ok. His blood pressure is typically in the 120/75 range. His heart rate is typically in the 70-80 range.   Nov. 9, 2016:   BP is up today . Has a cold and has been taking some cold meds May have eaten a bit of extra salt recently .   Past Medical History  Diagnosis Date  . Coronary artery disease     Status post PTCA and stenting of his right coronary artery in March, 2008. He also has a 40-50% LAD stenosis  . LAD stenosis     MODERATE BETWEEN 50-60%  . Hypertension   . Chest pain   .  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 Cardiac catheterization  08/23/2006    EF 65%. THERE IS INFEROBASILAR AKINESIS  . Coronary angioplasty with stent placement      STENTING OF HIS RCA  . Cardiovascular stress test  04/21/2010    EF 51%. NO SIGNIFICANT ST SEGMENT CHANGE SUGGESTIVE OF ISCHEMIA     Current Outpatient Prescriptions  Medication Sig Dispense Refill  . albuterol (PROAIR HFA) 108 (90 BASE) MCG/ACT inhaler Inhale 2 puffs into the lungs every 6 (six) hours as needed for wheezing or shortness of breath. 1 Inhaler 6  . amLODipine (NORVASC) 5 MG tablet Take 1 tablet (5 mg total) by mouth daily. 30 tablet 11  . aspirin 81 MG tablet Take 81 mg by mouth daily.      Marland Kitchen atorvastatin (LIPITOR) 20 MG tablet Take 1 tablet  (20 mg total) by mouth daily. 90 tablet 3  . carvedilol (COREG) 3.125 MG tablet Take 1 tablet (3.125 mg total) by mouth 2 (two) times daily. 60 tablet 11  . hydrochlorothiazide (HYDRODIURIL) 25 MG tablet TAKE 1 TABLET BY MOUTH EVERY DAY 30 tablet 1  . nitroGLYCERIN (NITROSTAT) 0.4 MG SL tablet Place 0.4 mg under the tongue every 5 (five) minutes as needed for chest pain (x 3 tabs daily).    . potassium chloride (K-DUR,KLOR-CON) 10 MEQ tablet Take 2 tablets (20 mEq total) by mouth daily. 60 tablet 5  . sildenafil (VIAGRA) 100 MG tablet Take 1 tablet (100 mg total) by mouth daily as needed for erectile dysfunction. 10 tablet 6   No current facility-administered medications for this visit.    Allergies:   Review of patient's allergies indicates no known allergies.    Social History:  The patient  reports that he has been smoking.  He does not have any smokeless tobacco history on file. He reports that he does not drink alcohol or use illicit drugs.   Family History:  The patient's family history includes Hypertension in his mother.    ROS:  Please see the history of present illness.    Review of Systems: Constitutional:  denies fever, chills, diaphoresis, appetite change and fatigue.  HEENT: denies photophobia, eye pain, redness, hearing loss, ear pain, congestion, sore throat, rhinorrhea, sneezing, neck pain, neck stiffness and tinnitus.  Respiratory: denies SOB, DOE, cough, chest tightness, and wheezing.  Cardiovascular: denies chest pain, palpitations and leg swelling.  Gastrointestinal: denies nausea, vomiting, abdominal pain, diarrhea, constipation, blood in stool.  Genitourinary: denies dysuria, urgency, frequency, hematuria, flank pain and difficulty urinating.  Musculoskeletal: denies  myalgias, back pain, joint swelling, arthralgias and gait problem.   Skin: denies pallor, rash and wound.  Neurological: denies dizziness, seizures, syncope, weakness, light-headedness, numbness and  headaches.   Hematological: denies adenopathy, easy bruising, personal or family bleeding history.  Psychiatric/ Behavioral: denies suicidal ideation, mood changes, confusion, nervousness, sleep disturbance and agitation.       All other systems are reviewed and negative.    PHYSICAL EXAM: VS:  BP 125/90 mmHg  Pulse 74  Ht  (1.803 m)  Wt 153 lb 12.8 oz (69.763 kg)  BMI 21.46 kg/m2 , BMI Body mass index is 21.46 kg/(m^2). GEN: Well nourished, well developed, in no acute distress HEENT: normal Neck: no JVD, carotid bruits, or masses Cardiac: RRR; no murmurs, rubs, or gallops,no edema  Respiratory:  clear to auscultation bilaterally, normal work of breathing GI: soft, nontender, nondistended, +  BS MS: no deformity or atrophy Skin: warm and dry, no rash Neuro:  Strength and sensation are intact Psych: normal   EKG:  EKG is ordered today. The ekg ordered today demonstrates normal sinus rhythm at 74. Possible inferior wall myocardial infarction. There are no changes from previous EKG.    Recent Labs: 10/09/2014: ALT 20; BUN 10; Creatinine, Ser 0.88; Potassium 3.7; Sodium 138    Lipid Panel    Component Value Date/Time   CHOL 180 10/09/2014 1013   TRIG 171.0* 10/09/2014 1013   HDL 40.60 10/09/2014 1013   CHOLHDL 4 10/09/2014 1013   VLDL 34.2 10/09/2014 1013   LDLCALC 105* 10/09/2014 1013   LDLDIRECT 137.2 01/20/2013 1016      Wt Readings from Last 3 Encounters:  04/21/15 153 lb 12.8 oz (69.763 kg)  10/09/14 156 lb 6.4 oz (70.943 kg)  04/21/14 151 lb 1.9 oz (68.548 kg)      Other studies Reviewed: Additional studies/ records that were reviewed today include: . Review of the above records demonstrates:     ASSESSMENT AND PLAN:  1. History of coronary artery disease-status post PTCA and stenting of his right coronary artery-08/23/2006 he has moderate disease involving his LAD.   He is not having any angina  2. Hypertension-  BP readings are well  controlled . A bit elevated today but this is likely because of the fast that he is just getting off work and is sleep deprived.  Continue current medications. Encouraged him to exercise on a regular basis. Encouraged him to stay away from salty foods.  3. Hyperlipidemia - will get fasting labs today    Current medicines are reviewed at length with the patient today.  The patient does not have concerns regarding medicines.  The following changes have been made:  no change  Labs/ tests ordered today include:  No orders of the defined types were placed in this encounter.     Disposition:   FU with me in 6 months.       Nahser, Deloris Ping, MD  04/21/2015 11:09 AM    [facility] [address] [place], [place], [place]  [address] Phone: [phone]; Fax: [phone]   [facility]  [address] [place], [place]  [address] [phone]    Fax [phone]

## 2015-04-21 NOTE — Patient Instructions (Signed)
Medication Instructions:  Your physician recommends that you continue on your current medications as directed. Please refer to the Current Medication list given to you today.   Labwork: TODAY - cholesterol, liver, basic metabolic panel   Testing/Procedures: None Ordered   Follow-Up: Your physician wants you to follow-up in: 6 months with Dr. Nahser.  You will receive a reminder letter in the mail two months in advance. If you don't receive a letter, please call our office to schedule the follow-up appointment.   If you need a refill on your cardiac medications before your next appointment, please call your pharmacy.   Thank you for choosing [facility]! Tequilla Cousineau, RN [phone]   

## 2015-04-22 ENCOUNTER — Telehealth: Payer: Self-pay | Admitting: Cardiovascular Disease

## 2015-04-22 MED ORDER — ATORVASTATIN CALCIUM 40 MG PO TABS: 40.0000 mg | ORAL_TABLET | Freq: Every day | ORAL | Status: DC

## 2015-04-22 NOTE — Telephone Encounter (Signed)
Reviewed lab results and plan of care with patient who verbalized understanding.  I advised him to call back with questions or concerns prior to 6 month follow-up.

## 2015-04-22 NOTE — Telephone Encounter (Signed)
New Message   Pt is calling because he states he is returning rn call

## 2015-05-28 ENCOUNTER — Other Ambulatory Visit: Payer: Self-pay | Admitting: Cardiovascular Disease

## 2015-06-17 ENCOUNTER — Emergency Department (HOSPITAL_COMMUNITY): Payer: Self-pay

## 2015-06-17 ENCOUNTER — Encounter (HOSPITAL_COMMUNITY): Payer: Self-pay | Admitting: *Deleted

## 2015-06-17 ENCOUNTER — Emergency Department (HOSPITAL_COMMUNITY)
Admission: EM | Admit: 2015-06-17 | Discharge: 2015-06-17 | Discharge status: Against Medical Advice | Payer: Self-pay | Attending: Emergency Medicine | Admitting: Emergency Medicine

## 2015-06-17 DIAGNOSIS — I10 Essential (primary) hypertension: Secondary | ICD-10-CM | POA: Insufficient documentation

## 2015-06-17 DIAGNOSIS — F17210 Nicotine dependence, cigarettes, uncomplicated: Secondary | ICD-10-CM | POA: Insufficient documentation

## 2015-06-17 DIAGNOSIS — I252 Old myocardial infarction: Secondary | ICD-10-CM | POA: Insufficient documentation

## 2015-06-17 DIAGNOSIS — I2510 Atherosclerotic heart disease of native coronary artery without angina pectoris: Secondary | ICD-10-CM | POA: Insufficient documentation

## 2015-06-17 DIAGNOSIS — R079 Chest pain, unspecified: Secondary | ICD-10-CM | POA: Insufficient documentation

## 2015-06-17 LAB — CBC
HEMATOCRIT: 43.7 % (ref 39.0–52.0)
HEMOGLOBIN: 15.2 g/dL (ref 13.0–17.0)
MCH: 31.9 pg (ref 26.0–34.0)
MCHC: 34.8 g/dL (ref 30.0–36.0)
MCV: 91.6 fL (ref 78.0–100.0)
Platelets: 226 10*3/uL (ref 150–400)
RBC: 4.77 MIL/uL (ref 4.22–5.81)
RDW: 14.7 % (ref 11.5–15.5)
WBC: 6.1 10*3/uL (ref 4.0–10.5)

## 2015-06-17 LAB — BASIC METABOLIC PANEL
Anion gap: 14 (ref 5–15)
BUN: 18 mg/dL (ref 6–20)
CHLORIDE: 102 mmol/L (ref 101–111)
CO2: 24 mmol/L (ref 22–32)
CREATININE: 1.26 mg/dL — AB (ref 0.61–1.24)
Calcium: 9.4 mg/dL (ref 8.9–10.3)
GFR calc non Af Amer: 59 mL/min — ABNORMAL LOW (ref >60)
Glucose, Bld: 145 mg/dL — ABNORMAL HIGH (ref 65–99)
POTASSIUM: 3.6 mmol/L (ref 3.5–5.1)
Sodium: 140 mmol/L (ref 135–145)

## 2015-06-17 LAB — I-STAT TROPONIN, ED: Troponin i, poc: 0.02 ng/mL (ref 0.00–0.08)

## 2015-06-17 NOTE — ED Notes (Addendum)
Pt requesting pain medication for chest pain, offered percocet for pain, pt declined offer, reports wanting to leave. Encouraged pt to stay; pt refused. Instructed pt to return if chest pain worsened, or any worsening symptoms began. Pt and family expressed understanding

## 2015-06-17 NOTE — ED Notes (Signed)
Pt c/o chest pain starting two hours ago. Pt reports history of MI in 2008.

## 2015-07-09 ENCOUNTER — Other Ambulatory Visit: Payer: Self-pay | Admitting: Cardiovascular Disease

## 2015-07-15 ENCOUNTER — Emergency Department (HOSPITAL_COMMUNITY)
Admission: EM | Admit: 2015-07-15 | Discharge: 2015-07-15 | Disposition: A | Discharge status: Home / Self Care | Payer: Self-pay | Attending: Emergency Medicine | Admitting: Emergency Medicine

## 2015-07-15 ENCOUNTER — Emergency Department (HOSPITAL_COMMUNITY): Payer: Self-pay

## 2015-07-15 DIAGNOSIS — Z9861 Coronary angioplasty status: Secondary | ICD-10-CM | POA: Insufficient documentation

## 2015-07-15 DIAGNOSIS — F17210 Nicotine dependence, cigarettes, uncomplicated: Secondary | ICD-10-CM | POA: Insufficient documentation

## 2015-07-15 DIAGNOSIS — Z9889 Other specified postprocedural states: Secondary | ICD-10-CM | POA: Insufficient documentation

## 2015-07-15 DIAGNOSIS — I2510 Atherosclerotic heart disease of native coronary artery without angina pectoris: Secondary | ICD-10-CM | POA: Insufficient documentation

## 2015-07-15 DIAGNOSIS — Z79899 Other long term (current) drug therapy: Secondary | ICD-10-CM | POA: Insufficient documentation

## 2015-07-15 DIAGNOSIS — Z7982 Long term (current) use of aspirin: Secondary | ICD-10-CM | POA: Insufficient documentation

## 2015-07-15 DIAGNOSIS — J069 Acute upper respiratory infection, unspecified: Secondary | ICD-10-CM | POA: Insufficient documentation

## 2015-07-15 DIAGNOSIS — R630 Anorexia: Secondary | ICD-10-CM | POA: Insufficient documentation

## 2015-07-15 DIAGNOSIS — I252 Old myocardial infarction: Secondary | ICD-10-CM | POA: Insufficient documentation

## 2015-07-15 DIAGNOSIS — I10 Essential (primary) hypertension: Secondary | ICD-10-CM | POA: Insufficient documentation

## 2015-07-15 LAB — BASIC METABOLIC PANEL
Anion gap: 13 (ref 5–15)
BUN: 12 mg/dL (ref 6–20)
CALCIUM: 8.8 mg/dL — AB (ref 8.9–10.3)
CHLORIDE: 96 mmol/L — AB (ref 101–111)
CO2: 25 mmol/L (ref 22–32)
CREATININE: 1.26 mg/dL — AB (ref 0.61–1.24)
GFR calc non Af Amer: 59 mL/min — ABNORMAL LOW (ref >60)
Glucose, Bld: 109 mg/dL — ABNORMAL HIGH (ref 65–99)
Potassium: 3.2 mmol/L — ABNORMAL LOW (ref 3.5–5.1)
SODIUM: 134 mmol/L — AB (ref 135–145)

## 2015-07-15 LAB — CBC
HCT: 46 % (ref 39.0–52.0)
HEMOGLOBIN: 16.3 g/dL (ref 13.0–17.0)
MCH: 32.8 pg (ref 26.0–34.0)
MCHC: 35.4 g/dL (ref 30.0–36.0)
MCV: 92.6 fL (ref 78.0–100.0)
Platelets: 170 10*3/uL (ref 150–400)
RBC: 4.97 MIL/uL (ref 4.22–5.81)
RDW: 14 % (ref 11.5–15.5)
WBC: 2.3 10*3/uL — ABNORMAL LOW (ref 4.0–10.5)

## 2015-07-15 LAB — DIFFERENTIAL
BASOS ABS: 0 10*3/uL (ref 0.0–0.1)
BASOS PCT: 0 %
EOS ABS: 0 10*3/uL (ref 0.0–0.7)
EOS PCT: 0 %
LYMPHS ABS: 0.6 10*3/uL — AB (ref 0.7–4.0)
Lymphocytes Relative: 27 %
MONOS PCT: 20 %
Monocytes Absolute: 0.5 10*3/uL (ref 0.1–1.0)
Neutro Abs: 1.2 10*3/uL — ABNORMAL LOW (ref 1.7–7.7)
Neutrophils Relative %: 53 %

## 2015-07-15 MED ORDER — METOCLOPRAMIDE HCL 5 MG/ML IJ SOLN
10.0000 mg | Freq: Once | INTRAMUSCULAR | Status: AC
  Administered 2015-07-15: 10 mg via INTRAVENOUS
  Filled 2015-07-15: qty 2

## 2015-07-15 MED ORDER — SODIUM CHLORIDE 0.9 % IV BOLUS (SEPSIS)
1000.0000 mL | Freq: Once | INTRAVENOUS | Status: AC
Start: 2015-07-15 — End: 2015-07-15
  Administered 2015-07-15: 1000 mL via INTRAVENOUS

## 2015-07-15 MED ORDER — DIPHENHYDRAMINE HCL 50 MG/ML IJ SOLN
25.0000 mg | Freq: Once | INTRAMUSCULAR | Status: AC
  Administered 2015-07-15: 25 mg via INTRAVENOUS
  Filled 2015-07-15: qty 1

## 2015-07-15 NOTE — ED Provider Notes (Signed)
CSN: [id number]     Arrival date & time 07/15/15  2130 History   First MD Initiated Contact with Patient 07/15/15 [phone]     Chief Complaint  Patient presents with  . Generalized Body Aches  . Fever   HPI  Javier Rios is a 65 y.o. male PMH significant for CAD, hypertension, MI presenting with a 3 day history of subjective fevers and generalized body aches. He endorses a frontal headache, nonradiating, 7 out of 10 pain scale, constant, achy. He endorses ill contacts, anorexia, nonproductive cough. He took Tylenol this morning prior to arrival. He denies chest pain, shortness of breath, nausea vomiting, change in bowel or bladder habits.  Past Medical History  Diagnosis Date  . Coronary artery disease     Status post PTCA and stenting of his right coronary artery in March, 2008. He also has a 40-50% LAD stenosis  . LAD stenosis     MODERATE BETWEEN 50-60%  . Hypertension   . Chest pain   . 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 Cardiac catheterization  08/23/2006    EF 65%. THERE IS INFEROBASILAR AKINESIS  . Coronary angioplasty with stent placement      STENTING OF HIS RCA  . Cardiovascular stress test  04/21/2010    EF 51%. NO SIGNIFICANT ST SEGMENT CHANGE SUGGESTIVE OF ISCHEMIA   Family History  Problem Relation Age of Onset  . Hypertension Mother    Social History  Substance Use Topics  . Smoking status: Current Every Day Smoker -- 1.00 packs/day    Types: Cigarettes  . Smokeless tobacco: Not on file  . Alcohol Use: Yes     Comment: ocassion    Review of Systems  Ten systems are reviewed and are negative for acute change except as noted in the HPI  Allergies  Review of patient's allergies indicates no known allergies.  Home Medications   Prior to Admission medications   Medication Sig Start Date End Date Taking? Authorizing Provider  albuterol (PROAIR HFA) 108 (90 BASE) MCG/ACT inhaler Inhale 2 puffs into the lungs every 6  (six) hours as needed for wheezing or shortness of breath. 09/05/13  Yes Vesta Mixer, MD  amLODipine (NORVASC) 5 MG tablet TAKE ONE TABLET BY MOUTH ONE TIME DAILY 07/09/15  Yes Vesta Mixer, MD  aspirin 81 MG tablet Take 81 mg by mouth daily.     Yes Historical Provider, MD  atorvastatin (LIPITOR) 40 MG tablet Take 1 tablet (40 mg total) by mouth daily. 04/22/15  Yes Vesta Mixer, MD  carvedilol (COREG) 3.125 MG tablet Take 1 tablet (3.125 mg total) by mouth 2 (two) times daily. 06/02/14  Yes Vesta Mixer, MD  hydrochlorothiazide (HYDRODIURIL) 25 MG tablet TAKE 1 TABLET BY MOUTH EVERY DAY 05/28/15  Yes Vesta Mixer, MD  sildenafil (VIAGRA) 100 MG tablet Take 1 tablet (100 mg total) by mouth daily as needed for erectile dysfunction. 01/05/15  Yes Vesta Mixer, MD  nitroGLYCERIN (NITROSTAT) 0.4 MG SL tablet Place 0.4 mg under the tongue every 5 (five) minutes as needed for chest pain (x 3 tabs daily). Reported on 07/15/2015    Historical Provider, MD  potassium chloride (K-DUR,KLOR-CON) 10 MEQ tablet Take 2 tablets (20 mEq total) by mouth daily. 01/20/13 04/21/15  Deloris Ping Nahser, MD   BP 146/98 mmHg  Pulse 94  Temp(Src) 97.6 F (36.4 C) (Oral)  Resp 20  [id number]  98% Physical Exam  Constitutional: He appears well-developed and well-nourished. No distress.  HENT:  Head: Normocephalic and atraumatic.  Mouth/Throat: Oropharynx is clear and moist. No oropharyngeal exudate.  Eyes: Conjunctivae are normal. Pupils are equal, round, and reactive to light. Right eye exhibits no discharge. Left eye exhibits no discharge. No scleral icterus.  Neck: No tracheal deviation present.  Cardiovascular: Normal rate, regular rhythm, normal heart sounds and intact distal pulses.  Exam reveals no gallop and no friction rub.   No murmur heard. Pulmonary/Chest: Effort normal and breath sounds normal. No respiratory distress. He has no wheezes. He has no rales. He exhibits no tenderness.  Abdominal: Soft.  Bowel sounds are normal. He exhibits no distension and no mass. There is no tenderness. There is no rebound and no guarding.  Musculoskeletal: He exhibits no edema.  Lymphadenopathy:    He has no cervical adenopathy.  Neurological: He is alert. Coordination normal.  Skin: Skin is warm and dry. No rash noted. He is not diaphoretic. No erythema.  Psychiatric: He has a normal mood and affect. His behavior is normal.  Nursing note and vitals reviewed.   ED Course  Procedures  Labs Review Labs Reviewed  CBC - Abnormal; Notable for the following:    WBC 2.3 (*)    All other components within normal limits  BASIC METABOLIC PANEL - Abnormal; Notable for the following:    Sodium 134 (*)    Potassium 3.2 (*)    Chloride 96 (*)    Glucose, Bld 109 (*)    Creatinine, Ser 1.26 (*)    Calcium 8.8 (*)    GFR calc non Af Amer 59 (*)    All other components within normal limits  DIFFERENTIAL - Abnormal; Notable for the following:    Neutro Abs 1.2 (*)    Lymphs Abs 0.6 (*)    All other components within normal limits    Imaging Review Dg Chest 2 View  07/15/2015  CLINICAL DATA:  65 year old male with cough shortness of breath and increasing weakness for 3 days. Initial encounter. Smoker EXAM: CHEST  2 VIEW COMPARISON:  06/17/2015 and earlier. FINDINGS: Lung volumes have increased since 2007, now with evidence of hyperinflation particularly on the right. Mediastinal contours remain normal. Tracheal air column is stable. No pneumothorax, pulmonary edema, pleural effusion or confluent pulmonary opacity. No pulmonary nodule. No acute osseous abnormality identified. Negative visible bowel gas pattern. IMPRESSION: Pulmonary hyperinflation.  No acute cardiopulmonary abnormality. Electronically Signed   By: Odessa Fleming M.D.   On: 07/15/2015 10:53   I have personally reviewed and evaluated these images and lab results as part of my medical decision-making.  MDM   Final diagnoses:  Upper respiratory  infection   Patient nontoxic appearing, vital signs stable. Based on patient history and physical exam, most likely etiology is viral versus reinfection. Less likely etiologies include pneumonia versus pulmonary edema. Leukopenia at 2.3. CBC otherwise unremarkable. Differential with absolute neutrophils 11.2 absolute lymphocytes at 0.6. BMP with serum creatinine of 1.26 which has been unchanged over the last month, however, patient will need to address this with primary care provider. Discussed case with Dr. Clarene Duke who advises close primary care follow-up regarding leukopenia. Patient may be safely discharged home. Return precautions discussed. The patient in agreement and understanding with the plan.  Melton Krebs, PA-C 07/15/15 1632  Laurence Spates, MD 07/16/15 [phone]

## 2015-07-15 NOTE — ED Notes (Signed)
Patient fells more fatigued than usual with activity and has a persistent headache and loss of appetite over the last 3 days.

## 2015-07-15 NOTE — Discharge Instructions (Signed)
[name],  Nice meeting you! Please follow-up with your primary care provider regarding your decreased white blood cell count and your kidney function. Stay hydrated and rest. Return to the emergency department if you develop shortness of breath, chest pain, if your headache worsens, or you develop slurred speech, weakness of your arms or legs. Feel better soon!  S. Lane Hacker, PA-C Upper Respiratory Infection, Adult Most upper respiratory infections (URIs) are a viral infection of the air passages leading to the lungs. A URI affects the nose, throat, and upper air passages. The most common type of URI is nasopharyngitis and is typically referred to as "the common cold." URIs run their course and usually go away on their own. Most of the time, a URI does not require medical attention, but sometimes a bacterial infection in the upper airways can follow a viral infection. This is called a secondary infection. Sinus and middle ear infections are common types of secondary upper respiratory infections. Bacterial pneumonia can also complicate a URI. A URI can worsen asthma and chronic obstructive pulmonary disease (COPD). Sometimes, these complications can require emergency medical care and may be life threatening.  CAUSES Almost all URIs are caused by viruses. A virus is a type of germ and can spread from one person to another.  RISKS FACTORS You may be at risk for a URI if:   You smoke.   You have chronic heart or lung disease.  You have a weakened defense (immune) system.   You are very young or very old.   You have nasal allergies or asthma.  You work in crowded or poorly ventilated areas.  You work in health care facilities or schools. SIGNS AND SYMPTOMS  Symptoms typically develop 2-3 days after you come in contact with a cold virus. Most viral URIs last 7-10 days. However, viral URIs from the influenza virus (flu virus) can last 14-18 days and are typically more severe.  Symptoms may include:   Runny or stuffy (congested) nose.   Sneezing.   Cough.   Sore throat.   Headache.   Fatigue.   Fever.   Loss of appetite.   Pain in your forehead, behind your eyes, and over your cheekbones (sinus pain).  Muscle aches.  DIAGNOSIS  Your health care provider may diagnose a URI by:  Physical exam.  Tests to check that your symptoms are not due to another condition such as:  Strep throat.  Sinusitis.  Pneumonia.  Asthma. TREATMENT  A URI goes away on its own with time. It cannot be cured with medicines, but medicines may be prescribed or recommended to relieve symptoms. Medicines may help:  Reduce your fever.  Reduce your cough.  Relieve nasal congestion. HOME CARE INSTRUCTIONS   Take medicines only as directed by your health care provider.   Gargle warm saltwater or take cough drops to comfort your throat as directed by your health care provider.  Use a warm mist humidifier or inhale steam from a shower to increase air moisture. This may make it easier to breathe.  Drink enough fluid to keep your urine clear or pale yellow.   Eat soups and other clear broths and maintain good nutrition.   Rest as needed.   Return to work when your temperature has returned to normal or as your health care provider advises. You may need to stay home longer to avoid infecting others. You can also use a face mask and careful hand washing to prevent spread of the  virus.  Increase the usage of your inhaler if you have asthma.   Do not use any tobacco products, including cigarettes, chewing tobacco, or electronic cigarettes. If you need help quitting, ask your health care provider. PREVENTION  The best way to protect yourself from getting a cold is to practice good hygiene.   Avoid oral or hand contact with people with cold symptoms.   Wash your hands often if contact occurs.  There is no clear evidence that vitamin C, vitamin E,  echinacea, or exercise reduces the chance of developing a cold. However, it is always recommended to get plenty of rest, exercise, and practice good nutrition.  SEEK MEDICAL CARE IF:   You are getting worse rather than better.   Your symptoms are not controlled by medicine.   You have chills.  You have worsening shortness of breath.  You have brown or red mucus.  You have yellow or brown nasal discharge.  You have pain in your face, especially when you bend forward.  You have a fever.  You have swollen neck glands.  You have pain while swallowing.  You have white areas in the back of your throat. SEEK IMMEDIATE MEDICAL CARE IF:   You have severe or persistent:  Headache.  Ear pain.  Sinus pain.  Chest pain.  You have chronic lung disease and any of the following:  Wheezing.  Prolonged cough.  Coughing up blood.  A change in your usual mucus.  You have a stiff neck.  You have changes in your:  Vision.  Hearing.  Thinking.  Mood. MAKE SURE YOU:   Understand these instructions.  Will watch your condition.  Will get help right away if you are not doing well or get worse.   This information is not intended to replace advice given to you by your health care provider. Make sure you discuss any questions you have with your health care provider.   Document Released: 11/22/2000 Document Revised: 10/13/2014 Document Reviewed: 09/03/2013 Elsevier Interactive Patient Education [employer]

## 2015-07-15 NOTE — ED Notes (Signed)
Pt c/o HA, fever, generalized body aches, clear/white nasal drainage, cough, anorexia x 3 days. Pt took antipyretic medication this morning.

## 2015-07-20 ENCOUNTER — Telehealth (HOSPITAL_COMMUNITY): Payer: Self-pay

## 2015-10-27 ENCOUNTER — Encounter: Payer: Self-pay | Admitting: Cardiovascular Disease

## 2015-10-27 ENCOUNTER — Ambulatory Visit (INDEPENDENT_AMBULATORY_CARE_PROVIDER_SITE_OTHER): Payer: Self-pay | Admitting: Cardiovascular Disease

## 2015-10-27 VITALS — BP 140/90 | HR 84 | Ht 71.0 in | Wt 150.8 lb

## 2015-10-27 DIAGNOSIS — I2510 Atherosclerotic heart disease of native coronary artery without angina pectoris: Secondary | ICD-10-CM

## 2015-10-27 DIAGNOSIS — D72819 Decreased white blood cell count, unspecified: Secondary | ICD-10-CM

## 2015-10-27 DIAGNOSIS — R011 Cardiac murmur, unspecified: Secondary | ICD-10-CM

## 2015-10-27 LAB — COMPREHENSIVE METABOLIC PANEL
ALT: 24 U/L (ref 9–46)
AST: 26 U/L (ref 10–35)
Albumin: 4.4 g/dL (ref 3.6–5.1)
Alkaline Phosphatase: 74 U/L (ref 40–115)
BUN: 16 mg/dL (ref 7–25)
CALCIUM: 9.5 mg/dL (ref 8.6–10.3)
CO2: 26 mmol/L (ref 20–31)
Chloride: 103 mmol/L (ref 98–110)
Creat: 1.07 mg/dL (ref 0.70–1.25)
GLUCOSE: 94 mg/dL (ref 65–99)
POTASSIUM: 4.3 mmol/L (ref 3.5–5.3)
Sodium: 138 mmol/L (ref 135–146)
Total Bilirubin: 0.6 mg/dL (ref 0.2–1.2)
Total Protein: 7.1 g/dL (ref 6.1–8.1)

## 2015-10-27 LAB — CBC WITH DIFFERENTIAL/PLATELET
BASOS PCT: 0 %
Basophils Absolute: 0 cells/uL (ref 0–200)
Eosinophils Absolute: 208 cells/uL (ref 15–500)
Eosinophils Relative: 4 %
HEMATOCRIT: 45.4 % (ref 38.5–50.0)
Hemoglobin: 15.6 g/dL (ref 13.2–17.1)
LYMPHS PCT: 37 %
Lymphs Abs: 1924 cells/uL (ref 850–3900)
MCH: 32.3 pg (ref 27.0–33.0)
MCHC: 34.4 g/dL (ref 32.0–36.0)
MCV: 94 fL (ref 80.0–100.0)
MONO ABS: 468 {cells}/uL (ref 200–950)
MONOS PCT: 9 %
MPV: 8.7 fL (ref 7.5–12.5)
NEUTROS PCT: 50 %
Neutro Abs: 2600 cells/uL (ref 1500–7800)
PLATELETS: 248 10*3/uL (ref 140–400)
RBC: 4.83 MIL/uL (ref 4.20–5.80)
RDW: 14.3 % (ref 11.0–15.0)
WBC: 5.2 10*3/uL (ref 3.8–10.8)

## 2015-10-27 LAB — LIPID PANEL
CHOL/HDL RATIO: 3.2 ratio (ref <=5.0)
Cholesterol: 167 mg/dL (ref 125–200)
HDL: 53 mg/dL (ref >=40)
LDL CALC: 82 mg/dL (ref <130)
Triglycerides: 162 mg/dL — ABNORMAL HIGH (ref <150)
VLDL: 32 mg/dL — AB (ref <30)

## 2015-10-27 NOTE — Patient Instructions (Signed)
Medication Instructions:  Your physician recommends that you continue on your current medications as directed. Please refer to the Current Medication list given to you today.   Labwork: TODAY - CBC, Complete metabolic panel, Cholesterol   Testing/Procedures: None Ordered   Follow-Up: Your physician wants you to follow-up in: 6 months with Dr. Elease HashimotoNahser.  You will receive a reminder letter in the mail two months in advance. If you don't receive a letter, please call our office to schedule the follow-up appointment.   If you need a refill on your cardiac medications before your next appointment, please call your pharmacy.   Thank you for choosing [facility]! Eligha BridegroomMichelle Aynsley Fleet, RN [phone]

## 2015-10-27 NOTE — Progress Notes (Signed)
Cardiology Office Note   Date:  10/27/2015   ID:  Javier Rios, DOB 05-03-1951, MRN [id number]  PCP:  No PCP Per Patient  Cardiologist:   Kristeen Miss, MD   Chief Complaint  Patient presents with  . Coronary Artery Disease  . Hyperlipidemia   1. History of coronary artery disease-status post PTCA and stenting of his right coronary artery-08/23/2006 he has moderate disease involving his LAD. 2. Hypertension 3. Hyperlipidemia  History of Present Illness:  65 year old gentleman with a history of coronary artery disease. He is status post PTCA and stenting of his right coronary artery. He has a moderate stenosis in his LAD.  His last stress Myoview study was November, 2011. There is no evidence of anterior wall ischemia. He has a mild defect in his inferior wall which corresponds to his previous inferior basilar myocardial infarction.  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 He is not having any chest pain.  He has trouble with getting his meds. He is eating lots of bologna because it is inexpensive. His power to his apartment is off and he is unable to eat regular food. He has been going to [employer] and eating chicken biscuits.   Feb. 6, 2014:  He has stopped taking all medications. He states he is not able to afford them. 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 He ran out of his meds. He is still eating lots of salty foods ( bologna, fast foods)  January 20, 2013:  Javier Rios is staying active. 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 Has not been taking his HCTZ or zocor due to cost. He is still eating fast foods - although he is trying to cut back.   September 05, 2013:  Javier Rios is still doing well. Still working 2 jobs - he has been having some indigestion - started taking gaviscon which helps. He has had some dyspnea . He has not wanted to have a stress test due to cost. His  insurance does not cover much. He walks at work and typically is able to get to his work day without too much trouble.  Nov. 10, 2015:  Javier Rios is seen today for followup of his coronary artery disease and hypertension. His blood pressure is a little elevated today. He stayed up last night and watched the football game. He also ate some fried chicken which probably had some extra salt in it. His BP has been elevated for the past several office visits.    April 29 , 2016:  Javier Rios is a 65 y.o. male who presents for follow-up of his coronary artery disease. BP is  Elevated this am .  He has been working all night.    He brought his BP log.  His readings are all ok. His blood pressure is typically in the 120/75 range. His heart rate is typically in the 70-80 range.   Nov. 9, 2016:   BP is up today . Has a cold and has been taking some cold meds May have eaten a bit of extra salt recently .   May 17 ,2017:  Doing well. Went to the ER with the   Past Medical History  Diagnosis Date  . Coronary artery disease     Status post PTCA and stenting of his right coronary artery in March, 2008. He also has a 40-50% LAD stenosis  .  LAD stenosis     MODERATE BETWEEN 50-60%  . Hypertension   . Chest pain   . 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 Cardiac catheterization  08/23/2006    EF 65%. THERE IS INFEROBASILAR AKINESIS  . Coronary angioplasty with stent placement      STENTING OF HIS RCA  . Cardiovascular stress test  04/21/2010    EF 51%. NO SIGNIFICANT ST SEGMENT CHANGE SUGGESTIVE OF ISCHEMIA     Current Outpatient Prescriptions  Medication Sig Dispense Refill  . albuterol (PROAIR HFA) 108 (90 BASE) MCG/ACT inhaler Inhale 2 puffs into the lungs every 6 (six) hours as needed for wheezing or shortness of breath. 1 Inhaler 6  . amLODipine (NORVASC) 5 MG tablet TAKE ONE TABLET BY MOUTH ONE TIME DAILY 30 tablet 11  . aspirin 81 MG  tablet Take 81 mg by mouth daily.      Marland Kitchen atorvastatin (LIPITOR) 40 MG tablet Take 1 tablet (40 mg total) by mouth daily. 90 tablet 3  . carvedilol (COREG) 3.125 MG tablet Take 1 tablet (3.125 mg total) by mouth 2 (two) times daily. 60 tablet 11  . hydrochlorothiazide (HYDRODIURIL) 25 MG tablet TAKE 1 TABLET BY MOUTH EVERY DAY 30 tablet 11  . nitroGLYCERIN (NITROSTAT) 0.4 MG SL tablet Place 0.4 mg under the tongue every 5 (five) minutes as needed for chest pain (x 3 tabs daily). Reported on 07/15/2015    . potassium chloride (K-DUR,KLOR-CON) 10 MEQ tablet Take 2 tablets (20 mEq total) by mouth daily. 60 tablet 5  . sildenafil (VIAGRA) 100 MG tablet Take 1 tablet (100 mg total) by mouth daily as needed for erectile dysfunction. 10 tablet 6   No current facility-administered medications for this visit.    Allergies:   Review of patient's allergies indicates no known allergies.    Social History:  The patient  reports that he has been smoking Cigarettes.  He has been smoking about 1.00 pack per day. He does not have any smokeless tobacco history on file. He reports that he drinks alcohol. He reports that he does not use illicit drugs.   Family History:  The patient's family history includes Hypertension in his mother.    ROS:  Please see the history of present illness.    Review of Systems: Constitutional:  denies fever, chills, diaphoresis, appetite change and fatigue.  HEENT: denies photophobia, eye pain, redness, hearing loss, ear pain, congestion, sore throat, rhinorrhea, sneezing, neck pain, neck stiffness and tinnitus.  Respiratory: denies SOB, DOE, cough, chest tightness, and wheezing.  Cardiovascular: denies chest pain, palpitations and leg swelling.  Gastrointestinal: denies nausea, vomiting, abdominal pain, diarrhea, constipation, blood in stool.  Genitourinary: denies dysuria, urgency, frequency, hematuria, flank pain and difficulty urinating.  Musculoskeletal: denies  myalgias,  back pain, joint swelling, arthralgias and gait problem.   Skin: denies pallor, rash and wound.  Neurological: denies dizziness, seizures, syncope, weakness, light-headedness, numbness and headaches.   Hematological: denies adenopathy, easy bruising, personal or family bleeding history.  Psychiatric/ Behavioral: denies suicidal ideation, mood changes, confusion, nervousness, sleep disturbance and agitation.       All other systems are reviewed and negative.    PHYSICAL EXAM: VS:  BP 140/90 mmHg  Pulse 84  Ht  (1.803 m)  Wt 150 lb 12.8 oz (68.402 kg)  BMI 21.04 kg/m2 , BMI Body mass index is 21.04 kg/(m^2). GEN: Well nourished, well developed, in no  acute distress HEENT: normal Neck: no JVD, carotid bruits, or masses Cardiac: RRR; 2-3 / 6 systolic murmur , no  rubs, or gallops,no edema  Respiratory:  clear to auscultation bilaterally, normal work of breathing GI: soft, nontender, nondistended, + BS MS: no deformity or atrophy Skin: warm and dry, no rash Neuro:  Strength and sensation are intact Psych: normal   EKG:  EKG is ordered today. The ekg ordered today demonstrates normal sinus rhythm at 74. Possible inferior wall myocardial infarction. There are no changes from previous EKG.    Recent Labs: 04/21/2015: ALT 19 07/15/2015: BUN 12; Creatinine, Ser 1.26*; Hemoglobin 16.3; Platelets 170; Potassium 3.2*; Sodium 134*    Lipid Panel    Component Value Date/Time   CHOL 197 04/21/2015 1134   TRIG 95 04/21/2015 1134   HDL 45 04/21/2015 1134   CHOLHDL 4.4 04/21/2015 1134   VLDL 19 04/21/2015 1134   LDLCALC 133* 04/21/2015 1134   LDLDIRECT 137.2 01/20/2013 1016      Wt Readings from Last 3 Encounters:  10/27/15 150 lb 12.8 oz (68.402 kg)  06/17/15 158 lb (71.668 kg)  04/21/15 153 lb 12.8 oz (69.763 kg)     Other studies Reviewed: Additional studies/ records that were reviewed today include: . Review of the above records demonstrates:    ASSESSMENT AND  PLAN:  1. History of coronary artery disease-status post PTCA and stenting of his right coronary artery-08/23/2006 he has moderate disease involving his LAD.   He is not having any angina  2. Hypertension-  BP readings are well controlled . A bit elevated today but this is likely because of the fast that he is just getting off work and is sleep deprived.  Continue current medications. Encouraged him to exercise on a regular basis. Encouraged him to stay away from salty foods.  3. Hyperlipidemia - will get fasting labs today   4. Murmur - has a systolic murmur that is worse / new ? MR or TR. He does not have insurance and does not want to get an echo now Will wait and see if he gets Medicare soon.   No signs or symptoms of CHF  Current medicines are reviewed at length with the patient today.  The patient does not have concerns regarding medicines.  The following changes have been made:  no change  Labs/ tests ordered today include:  No orders of the defined types were placed in this encounter.     Disposition:   FU with me in 6 months.       Kristeen MissPhilip Darol Cush, MD  10/27/2015 11:17 AM    [facility] [address], [place], [place]  [address] Phone: [phone]; Fax: [phone]   [facility]  [address] [place], [place]  [address] [phone]    Fax [phone]

## 2016-04-28 ENCOUNTER — Encounter: Payer: Self-pay | Admitting: Cardiovascular Disease

## 2016-05-01 ENCOUNTER — Other Ambulatory Visit: Payer: Self-pay | Admitting: Cardiovascular Disease

## 2016-05-09 ENCOUNTER — Encounter: Payer: Self-pay | Admitting: Cardiovascular Disease

## 2016-05-09 ENCOUNTER — Ambulatory Visit (INDEPENDENT_AMBULATORY_CARE_PROVIDER_SITE_OTHER): Payer: Self-pay | Admitting: Cardiovascular Disease

## 2016-05-09 ENCOUNTER — Encounter (INDEPENDENT_AMBULATORY_CARE_PROVIDER_SITE_OTHER): Payer: Self-pay

## 2016-05-09 VITALS — BP 170/102 | HR 75 | Ht 70.5 in | Wt 160.8 lb

## 2016-05-09 DIAGNOSIS — I10 Essential (primary) hypertension: Secondary | ICD-10-CM

## 2016-05-09 DIAGNOSIS — I2510 Atherosclerotic heart disease of native coronary artery without angina pectoris: Secondary | ICD-10-CM

## 2016-05-09 DIAGNOSIS — E782 Mixed hyperlipidemia: Secondary | ICD-10-CM

## 2016-05-09 LAB — BASIC METABOLIC PANEL
BUN: 11 mg/dL (ref 7–25)
CALCIUM: 9.5 mg/dL (ref 8.6–10.3)
CO2: 25 mmol/L (ref 20–31)
CREATININE: 1.07 mg/dL (ref 0.70–1.25)
Chloride: 101 mmol/L (ref 98–110)
GLUCOSE: 119 mg/dL — AB (ref 65–99)
Potassium: 3.5 mmol/L (ref 3.5–5.3)
SODIUM: 137 mmol/L (ref 135–146)

## 2016-05-09 MED ORDER — CARVEDILOL 6.25 MG PO TABS: 6.2500 mg | ORAL_TABLET | Freq: Two times a day (BID) | ORAL | 3 refills | Status: DC

## 2016-05-09 MED ORDER — SPIRONOLACTONE 25 MG PO TABS: 25.0000 mg | ORAL_TABLET | Freq: Every day | ORAL | 3 refills | Status: DC

## 2016-05-09 MED ORDER — NITROGLYCERIN 0.4 MG SL SUBL: 0.4000 mg | SUBLINGUAL_TABLET | SUBLINGUAL | 6 refills | Status: DC | PRN

## 2016-05-09 MED ORDER — ALBUTEROL SULFATE HFA 108 (90 BASE) MCG/ACT IN AERS: 2.0000 | INHALATION_SPRAY | Freq: Four times a day (QID) | RESPIRATORY_TRACT | 6 refills | Status: DC | PRN

## 2016-05-09 NOTE — Addendum Note (Signed)
Addended by: Levi AlandSWINYER, MICHELLE M on: 05/09/2016 10:44 AM   Modules accepted: Orders

## 2016-05-09 NOTE — Progress Notes (Signed)
Cardiology Office Note   Date:  05/09/2016   ID:  Javier Rios, DOB 06-Aug-1950, MRN [id number]  PCP:  No PCP Per Patient  Cardiologist:   Kristeen MissPhilip Damarko Stitely, MD   Chief Complaint  Patient presents with  . Coronary Artery Disease   1. History of coronary artery disease-status post PTCA and stenting of his right coronary artery-08/23/2006 he has moderate disease involving his LAD. 2. Hypertension 3. Hyperlipidemia   65 year old gentleman with a history of coronary artery disease. He is status post PTCA and stenting of his right coronary artery. He has a moderate stenosis in his LAD.  His last stress Myoview study was November, 2011. There is no evidence of anterior wall ischemia. He has a mild defect in his inferior wall which corresponds to his previous inferior basilar myocardial infarction.  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 He is not having any chest pain.  He has trouble with getting his meds. He is eating lots of bologna because it is inexpensive. His power to his apartment is off and he is unable to eat regular food. He has been going to [employer] and eating chicken biscuits.   Feb. 6, 2014:  He has stopped taking all medications. He states he is not able to afford them. 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 He ran out of his meds. He is still eating lots of salty foods ( bologna, fast foods)  January 20, 2013:  Javier Rios is staying active. 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 No CP. Has not been taking his HCTZ or zocor due to cost. He is still eating fast foods - although he is trying to cut back.   September 05, 2013:  Javier Rios is still doing well. Still working 2 jobs - he has been having some indigestion - started taking gaviscon which helps. He has had some dyspnea . He has not wanted to have a stress test due to cost. His insurance does not cover much. He walks at work  and typically is able to get to his work day without too much trouble.  Nov. 10, 2015:  Javier Rios is seen today for followup of his coronary artery disease and hypertension. His blood pressure is a little elevated today. He stayed up last night and watched the football game. He also ate some fried chicken which probably had some extra salt in it. His BP has been elevated for the past several office visits.    April 29 , 2016:  Javier Rios is a 65 y.o. male who presents for follow-up of his coronary artery disease. BP is  Elevated this am .  He has been working all night.    He brought his BP log.  His readings are all ok. His blood pressure is typically in the 120/75 range. His heart rate is typically in the 70-80 range.   Nov. 9, 2016:   BP is up today . Has a cold and has been taking some cold meds May have eaten a bit of extra salt recently .   May 17 ,2017:  Doing well. Went to the ER with the   Nov. 28, 2017:  Javier Rios is seen today for follow-up of his coronary artery disease, hypertension and congestive heart failure. BP is elevated  Still eating lots of salt  Still smoking  Has requested an inhaler   Past Medical History:  Diagnosis  Date  . Chest pain   . Coronary artery disease    Status post PTCA and stenting of his right coronary artery in March, 2008. He also has a 40-50% LAD stenosis  . Hypertension   . LAD stenosis    MODERATE BETWEEN 50-60%  . 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 CARDIAC CATHETERIZATION  08/23/2006   EF 65%. THERE IS INFEROBASILAR AKINESIS  . CARDIOVASCULAR STRESS TEST  04/21/2010   EF 51%. NO SIGNIFICANT ST SEGMENT CHANGE SUGGESTIVE OF ISCHEMIA  . CORONARY ANGIOPLASTY WITH STENT PLACEMENT     STENTING OF HIS RCA     Current Outpatient Prescriptions  Medication Sig Dispense Refill  . albuterol (PROAIR HFA) 108 (90 BASE) MCG/ACT inhaler Inhale 2 puffs into the lungs every 6 (six) hours as  needed for wheezing or shortness of breath. 1 Inhaler 6  . amLODipine (NORVASC) 5 MG tablet TAKE ONE TABLET BY MOUTH ONE TIME DAILY 30 tablet 11  . aspirin 81 MG tablet Take 81 mg by mouth daily.      Marland Kitchen atorvastatin (LIPITOR) 40 MG tablet TAKE 1 TABLET (40 MG TOTAL) BY MOUTH DAILY. 90 tablet 1  . carvedilol (COREG) 3.125 MG tablet Take 1 tablet (3.125 mg total) by mouth 2 (two) times daily. 60 tablet 11  . hydrochlorothiazide (HYDRODIURIL) 25 MG tablet TAKE 1 TABLET BY MOUTH EVERY DAY 30 tablet 11  . nitroGLYCERIN (NITROSTAT) 0.4 MG SL tablet Place 0.4 mg under the tongue every 5 (five) minutes as needed for chest pain (x 3 tabs daily). Reported on 07/15/2015    . potassium chloride (K-DUR,KLOR-CON) 10 MEQ tablet Take 10 mEq by mouth daily.    . sildenafil (VIAGRA) 100 MG tablet Take 1 tablet (100 mg total) by mouth daily as needed for erectile dysfunction. 10 tablet 6   No current facility-administered medications for this visit.     Allergies:   Patient has no known allergies.    Social History:  The patient  reports that he has been smoking Cigarettes.  He has been smoking about 1.00 pack per day. He has never used smokeless tobacco. He reports that he drinks alcohol. He reports that he does not use drugs.   Family History:  The patient's family history includes Hypertension in his mother.    ROS:  Please see the history of present illness.    Review of Systems: Constitutional:  denies fever, chills, diaphoresis, appetite change and fatigue.  HEENT: denies photophobia, eye pain, redness, hearing loss, ear pain, congestion, sore throat, rhinorrhea, sneezing, neck pain, neck stiffness and tinnitus.  Respiratory: denies SOB, DOE, cough, chest tightness, and wheezing.  Cardiovascular: denies chest pain, palpitations and leg swelling.  Gastrointestinal: denies nausea, vomiting, abdominal pain, diarrhea, constipation, blood in stool.  Genitourinary: denies dysuria, urgency, frequency,  hematuria, flank pain and difficulty urinating.  Musculoskeletal: denies  myalgias, back pain, joint swelling, arthralgias and gait problem.   Skin: denies pallor, rash and wound.  Neurological: denies dizziness, seizures, syncope, weakness, light-headedness, numbness and headaches.   Hematological: denies adenopathy, easy bruising, personal or family bleeding history.  Psychiatric/ Behavioral: denies suicidal ideation, mood changes, confusion, nervousness, sleep disturbance and agitation.       All other systems are reviewed and negative.    PHYSICAL EXAM: VS:  BP (!) 170/102   Pulse 75   Ht 5' 10.5" (1.791 m)   Wt 160 lb 12.8 oz (72.9 kg)  BMI 22.75 kg/m  , BMI Body mass index is 22.75 kg/m. GEN: Well nourished, well developed, in no acute distress  HEENT: normal  Neck: no JVD, carotid bruits, or masses Cardiac: RRR; 1-2  / 6 systolic murmur , no  rubs, or gallops,no edema  Respiratory:  clear to auscultation bilaterally, normal work of breathing GI: soft, nontender, nondistended, + BS MS: no deformity or atrophy  Skin: warm and dry, no rash Neuro:  Strength and sensation are intact Psych: normal   EKG:  EKG is ordered today. The ekg ordered today demonstrates normal sinus rhythm at 75.   Voltage criteria for left ventricular hypertrophy. Previous inferior wall myocardial infarction. There is no changes from previous EKGs.    Recent Labs: 10/27/2015: ALT 24; BUN 16; Creat 1.07; Hemoglobin 15.6; Platelets 248; Potassium 4.3; Sodium 138    Lipid Panel    Component Value Date/Time   CHOL 167 10/27/2015 1132   TRIG 162 (H) 10/27/2015 1132   HDL 53 10/27/2015 1132   CHOLHDL 3.2 10/27/2015 1132   VLDL 32 (H) 10/27/2015 1132   LDLCALC 82 10/27/2015 1132   LDLDIRECT 137.2 01/20/2013 1016      Wt Readings from Last 3 Encounters:  05/09/16 160 lb 12.8 oz (72.9 kg)  10/27/15 150 lb 12.8 oz (68.4 kg)  06/17/15 158 lb (71.7 kg)     Other studies Reviewed: Additional  studies/ records that were reviewed today include: . Review of the above records demonstrates:    ASSESSMENT AND PLAN:  1. History of coronary artery disease-status post PTCA and stenting of his right coronary artery-08/23/2006 he has moderate disease involving his LAD.   He is not having any angina  2. Hypertension-  BP readings are elevated.   Still eating salty foods Encouraged him to exercise on a regular basis. Encouraged him to stay away from salty foods.  3. Hyperlipidemia - ran out of his atorvastatin , Will check labs next time   4. Murmur - has a systolic murmur  He does not have insurance and does not want to get an echo now Will wait and see if he gets Medicare soon.   No signs or symptoms of CHF  Current medicines are reviewed at length with the patient today.  The patient does not have concerns regarding medicines.  The following changes have been made:  no change  Labs/ tests ordered today include:  No orders of the defined types were placed in this encounter.    Disposition:   FU with me in 3 months.       Kristeen MissPhilip Trayton Szabo, MD  05/09/2016 8:32 AM    [facility] [address], [place], [place]  [address] Phone: [phone]; Fax: [phone]   [facility]  [address] [place], [place]  [address] [phone]    Fax [phone]

## 2016-05-09 NOTE — Patient Instructions (Addendum)
Medication Instructions:  INCREASE Carvedilol (Coreg) to 6.25 mg twice daily START Aldactone (Spironolactone) 25 mg once daily START Albuterol inhaler 2 puffs every 6 hours as needed for wheezing/coughing STOP Kdur (potassium supplement)   Labwork: TODAY - basic metabolic panel  Your physician recommends that you return for lab work in: 1 week for basic metabolic panel   Testing/Procedures: None Ordered   Follow-Up: Your physician recommends that you schedule a follow-up appointment in: 3 months with Dr. Elease HashimotoNahser   If you need a refill on your cardiac medications before your next appointment, please call your pharmacy.   Thank you for choosing [facility]! Eligha BridegroomMichelle Jaylenne Hamelin, RN [phone]

## 2016-05-10 ENCOUNTER — Telehealth: Payer: Self-pay | Admitting: Cardiovascular Disease

## 2016-05-10 MED ORDER — POTASSIUM CHLORIDE ER 10 MEQ PO TBCR: 10.0000 meq | EXTENDED_RELEASE_TABLET | Freq: Every day | ORAL | 11 refills | Status: DC

## 2016-05-10 NOTE — Telephone Encounter (Signed)
Left message for patient to call back  

## 2016-05-10 NOTE — Telephone Encounter (Signed)
New message  ° ° °Pt verbalized that he is returning call for rn °

## 2016-05-10 NOTE — Telephone Encounter (Signed)
Spoke with patient and reviewed lab results.  Patient had difficulty when he was in the office yesterday identifying which medication bottles he has at home.  I asked him to call back with pill bottles and med list so that I can verify he is taking all of his medications.  We reviewed his medication list.  He states he has not picked up new Rx at pharmacy.  He states his medications are very expensive.  I advised him that all of his medications are very important and if he could event take 1/2 of prescribed doses, it would be better than not taking any of them.  He is scheduled for repeat bmet on 12/13. He verbalized understanding and agreement with plan and thanked me for the call.

## 2016-05-10 NOTE — Telephone Encounter (Signed)
PT RETURNED CALL-PLS CALL BACK

## 2016-05-15 ENCOUNTER — Other Ambulatory Visit: Payer: Self-pay

## 2016-05-17 IMAGING — CR DG CHEST 2V
2 series · 2 of 2 positions shown · non-contrast
Comparison: 06/17/2015 and earlier.

CLINICAL DATA: 64-year-old male with cough shortness of breath and
increasing weakness for 3 days. Initial encounter. Smoker

EXAM:
CHEST  2 VIEW

[w chest pa]
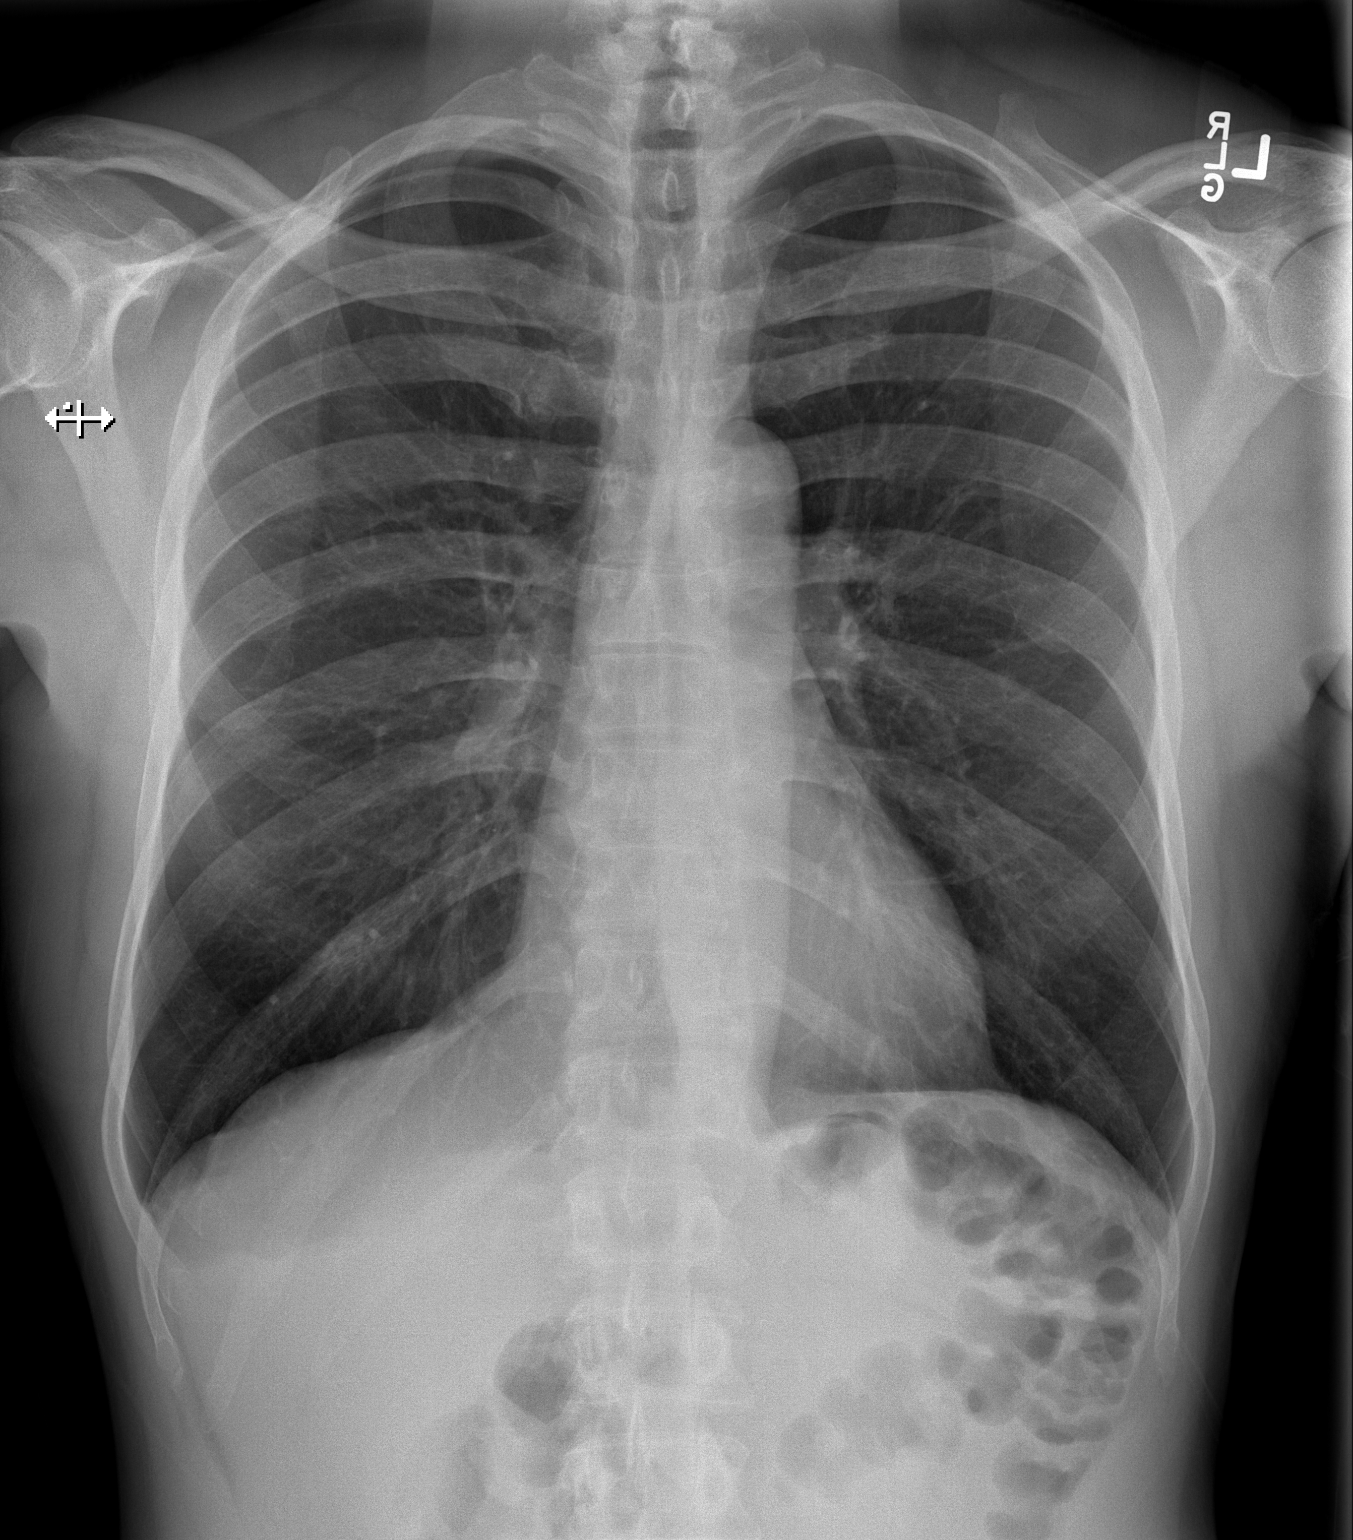

[w chest lat]
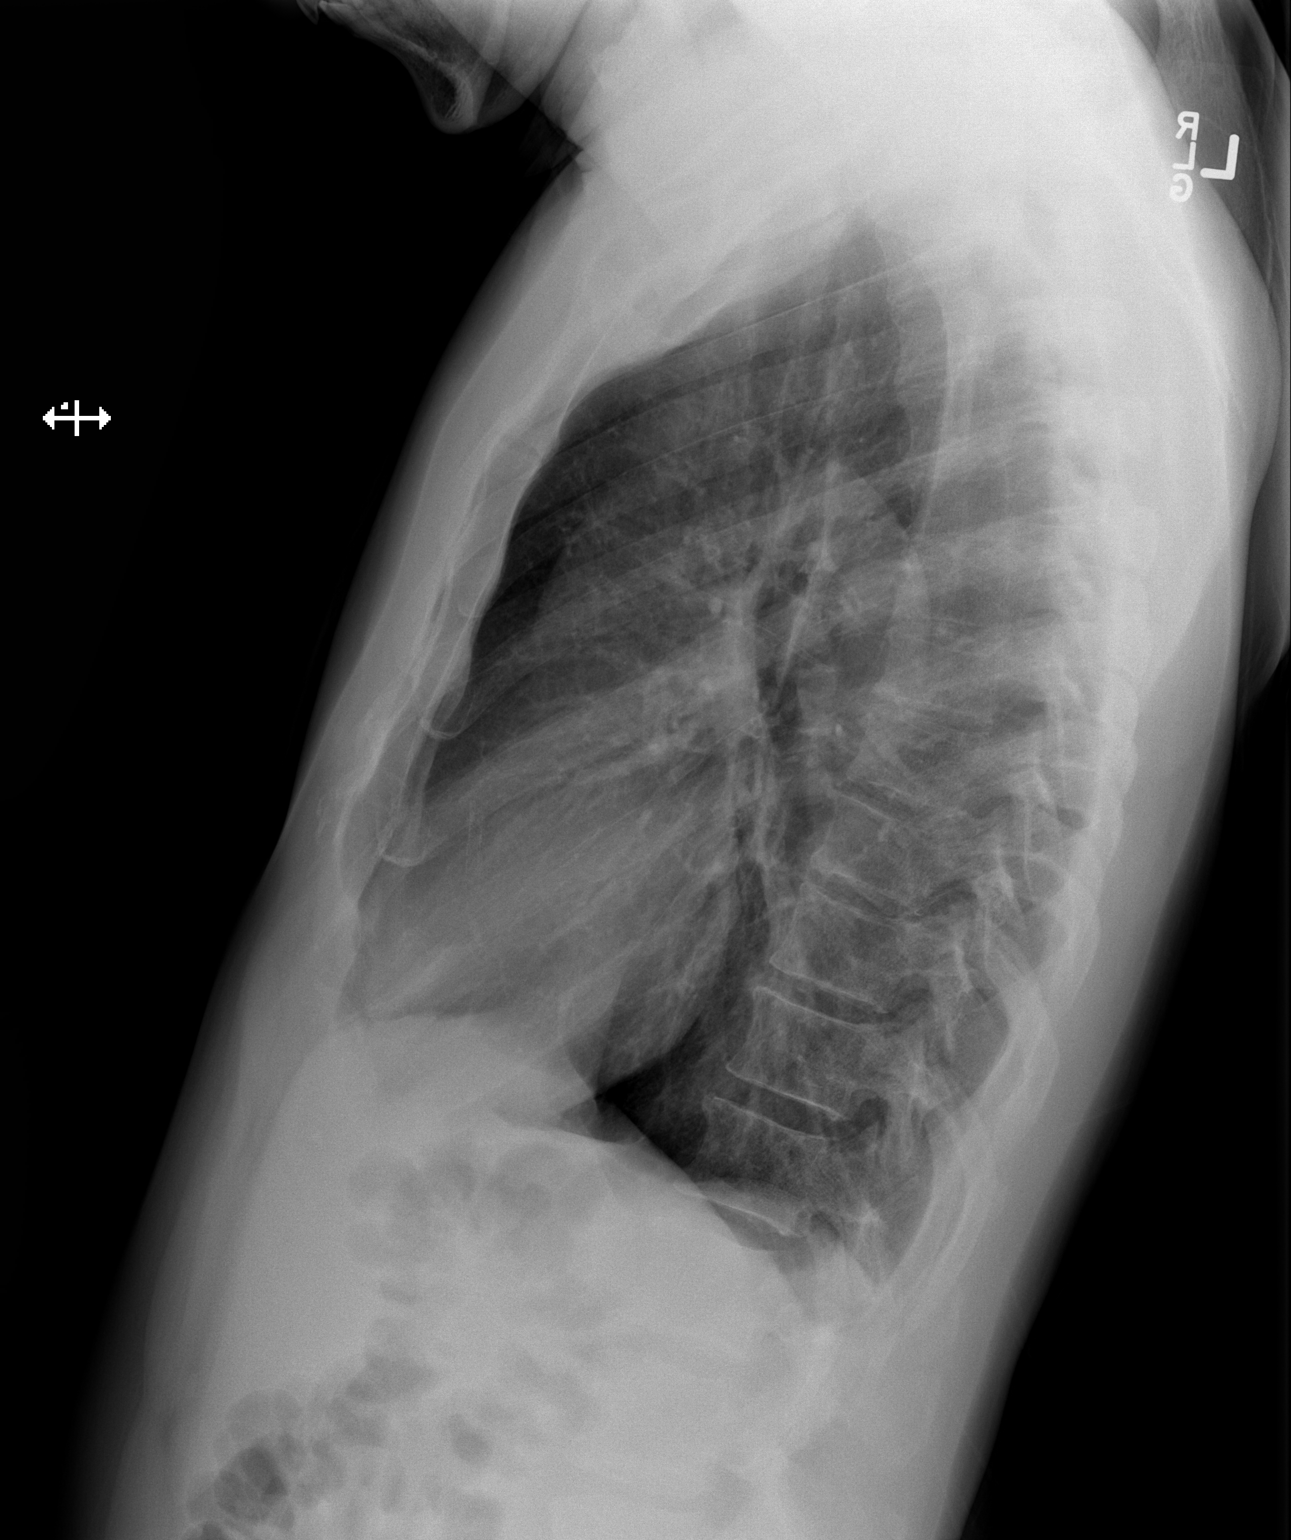

[2 of 2 positions shown; findings below may reference images not displayed]

FINDINGS: Lung volumes have increased since 3995, now with evidence of
hyperinflation particularly on the right. Mediastinal contours
remain normal. Tracheal air column is stable. No pneumothorax,
pulmonary edema, pleural effusion or confluent pulmonary opacity. No
pulmonary nodule. No acute osseous abnormality identified. Negative
visible bowel gas pattern.
IMPRESSION: Pulmonary hyperinflation.  No acute cardiopulmonary abnormality.

## 2016-05-24 ENCOUNTER — Other Ambulatory Visit: Payer: Self-pay | Admitting: *Deleted

## 2016-05-24 DIAGNOSIS — I10 Essential (primary) hypertension: Secondary | ICD-10-CM

## 2016-05-24 LAB — BASIC METABOLIC PANEL
BUN: 15 mg/dL (ref 7–25)
CHLORIDE: 103 mmol/L (ref 98–110)
CO2: 30 mmol/L (ref 20–31)
Calcium: 9.3 mg/dL (ref 8.6–10.3)
Creat: 1.41 mg/dL — ABNORMAL HIGH (ref 0.70–1.25)
Glucose, Bld: 105 mg/dL — ABNORMAL HIGH (ref 65–99)
POTASSIUM: 3.7 mmol/L (ref 3.5–5.3)
SODIUM: 139 mmol/L (ref 135–146)

## 2016-05-26 ENCOUNTER — Telehealth: Payer: Self-pay | Admitting: Cardiovascular Disease

## 2016-05-26 NOTE — Telephone Encounter (Signed)
New message ° °Pt states he is returning RN call. Please call back to discuss. °

## 2016-05-26 NOTE — Telephone Encounter (Signed)
Results of lab work reviewed with patient who verbalized understanding to continue current medications.  He is scheduled for follow-up in February with Dr. Elease HashimotoNahser.

## 2016-05-29 ENCOUNTER — Other Ambulatory Visit: Payer: Self-pay | Admitting: Cardiovascular Disease

## 2016-07-05 ENCOUNTER — Other Ambulatory Visit: Payer: Self-pay | Admitting: Cardiovascular Disease

## 2016-07-28 ENCOUNTER — Other Ambulatory Visit: Payer: Self-pay | Admitting: Cardiovascular Disease

## 2016-08-08 ENCOUNTER — Encounter: Payer: Self-pay | Admitting: Cardiovascular Disease

## 2016-08-08 ENCOUNTER — Encounter (INDEPENDENT_AMBULATORY_CARE_PROVIDER_SITE_OTHER): Payer: Self-pay

## 2016-08-08 ENCOUNTER — Ambulatory Visit (INDEPENDENT_AMBULATORY_CARE_PROVIDER_SITE_OTHER): Payer: Self-pay | Admitting: Cardiovascular Disease

## 2016-08-08 VITALS — BP 130/70 | HR 74 | Ht 70.5 in | Wt 162.0 lb

## 2016-08-08 DIAGNOSIS — I10 Essential (primary) hypertension: Secondary | ICD-10-CM

## 2016-08-08 DIAGNOSIS — E782 Mixed hyperlipidemia: Secondary | ICD-10-CM

## 2016-08-08 LAB — LIPID PANEL
CHOLESTEROL TOTAL: 192 mg/dL (ref 100–199)
Chol/HDL Ratio: 3.9 ratio units (ref 0.0–5.0)
HDL: 49 mg/dL (ref >39)
LDL Calculated: 112 mg/dL — ABNORMAL HIGH (ref 0–99)
Triglycerides: 155 mg/dL — ABNORMAL HIGH (ref 0–149)
VLDL CHOLESTEROL CAL: 31 mg/dL (ref 5–40)

## 2016-08-08 LAB — COMPREHENSIVE METABOLIC PANEL
ALK PHOS: 77 IU/L (ref 39–117)
ALT: 23 IU/L (ref 0–44)
AST: 26 IU/L (ref 0–40)
Albumin/Globulin Ratio: 1.8 (ref 1.2–2.2)
Albumin: 4.4 g/dL (ref 3.6–4.8)
BUN / CREAT RATIO: 11 (ref 10–24)
BUN: 13 mg/dL (ref 8–27)
Bilirubin Total: 0.3 mg/dL (ref 0.0–1.2)
CO2: 24 mmol/L (ref 18–29)
CREATININE: 1.18 mg/dL (ref 0.76–1.27)
Calcium: 9.5 mg/dL (ref 8.6–10.2)
Chloride: 99 mmol/L (ref 96–106)
GFR calc non Af Amer: 65 mL/min/{1.73_m2} (ref >59)
GFR, EST AFRICAN AMERICAN: 75 mL/min/{1.73_m2} (ref >59)
GLOBULIN, TOTAL: 2.5 g/dL (ref 1.5–4.5)
GLUCOSE: 118 mg/dL — AB (ref 65–99)
Potassium: 4 mmol/L (ref 3.5–5.2)
SODIUM: 140 mmol/L (ref 134–144)
TOTAL PROTEIN: 6.9 g/dL (ref 6.0–8.5)

## 2016-08-08 MED ORDER — POTASSIUM CHLORIDE ER 10 MEQ PO TBCR
10.0000 meq | EXTENDED_RELEASE_TABLET | Freq: Every day | ORAL | 3 refills | Status: DC
Start: 2016-08-08 — End: 2017-10-12

## 2016-08-08 NOTE — Patient Instructions (Signed)

## 2016-08-08 NOTE — Progress Notes (Signed)
Cardiology Office Note   Date:  08/08/2016   ID:  Javier Rios, DOB 1950-06-22, MRN [id number]  PCP:  No PCP Per Patient  Cardiologist:   Kristeen MissPhilip Alaa Mullally, MD   Chief Complaint  Patient presents with  . Hypertension   1. History of coronary artery disease-status post PTCA and stenting of his right coronary artery-08/23/2006 he has moderate disease involving his LAD. 2. Hypertension 3. Hyperlipidemia   66 year old gentleman with a history of coronary artery disease. He is status post PTCA and stenting of his right coronary artery. He has a moderate stenosis in his LAD.  His last stress Myoview study was November, 2011. There is no evidence of anterior wall ischemia. He has a mild defect in his inferior wall which corresponds to his previous inferior basilar myocardial infarction.  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 He is not having any chest pain.  He has trouble with getting his meds. He is eating lots of bologna because it is inexpensive. His power to his apartment is off and he is unable to eat regular food. He has been going to [employer] and eating chicken biscuits.   Feb. 6, 2014:  He has stopped taking all medications. He states he is not able to afford them. 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 He ran out of his meds. He is still eating lots of salty foods ( bologna, fast foods)  January 20, 2013:  Javier Rios is staying active. 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 No CP. Has not been taking his HCTZ or zocor due to cost. He is still eating fast foods - although he is trying to cut back.   September 05, 2013:  Javier Rios is still doing well. Still working 2 jobs - he has been having some indigestion - started taking gaviscon which helps. He has had some dyspnea . He has not wanted to have a stress test due to cost. His insurance does not cover much. He walks at work and typically  is able to get to his work day without too much trouble.  Nov. 10, 2015:  Javier Rios is seen today for followup of his coronary artery disease and hypertension. His blood pressure is a little elevated today. He stayed up last night and watched the football game. He also ate some fried chicken which probably had some extra salt in it. His BP has been elevated for the past several office visits.    April 29 , 2016:  Javier Rios is a 66 y.o. male who presents for follow-up of his coronary artery disease. BP is  Elevated this am .  He has been working all night.    He brought his BP log.  His readings are all ok. His blood pressure is typically in the 120/75 range. His heart rate is typically in the 70-80 range.   Nov. 9, 2016:   BP is up today . Has a cold and has been taking some cold meds May have eaten a bit of extra salt recently .   May 17 ,2017:  Doing well. Went to the ER with the   Nov. 28, 2017:  Javier Rios is seen today for follow-up of his coronary artery disease, hypertension and congestive heart failure. BP is elevated  Still eating lots of salt  Still smoking  Has requested an inhaler   Feb. 27, 2018:  Doing ok Is  taking his meds.   BP is well controlled    Past Medical History:  Diagnosis Date  . Chest pain   . Coronary artery disease    Status post PTCA and stenting of his right coronary artery in March, 2008. He also has a 40-50% LAD stenosis  . Hypertension   . LAD stenosis    MODERATE BETWEEN 50-60%  . 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 CARDIAC CATHETERIZATION  08/23/2006   EF 65%. THERE IS INFEROBASILAR AKINESIS  . CARDIOVASCULAR STRESS TEST  04/21/2010   EF 51%. NO SIGNIFICANT ST SEGMENT CHANGE SUGGESTIVE OF ISCHEMIA  . CORONARY ANGIOPLASTY WITH STENT PLACEMENT     STENTING OF HIS RCA     Current Outpatient Prescriptions  Medication Sig Dispense Refill  . albuterol (PROAIR HFA) 108 (90 Base) MCG/ACT  inhaler Inhale 2 puffs into the lungs every 6 (six) hours as needed for wheezing or shortness of breath. 1 Inhaler 6  . amLODipine (NORVASC) 5 MG tablet TAKE ONE TABLET BY MOUTH ONE TIME DAILY 30 tablet 8  . aspirin 81 MG tablet Take 81 mg by mouth daily.      Marland Kitchen atorvastatin (LIPITOR) 40 MG tablet TAKE 1 TABLET (40 MG TOTAL) BY MOUTH DAILY. 90 tablet 1  . carvedilol (COREG) 6.25 MG tablet Take 1 tablet (6.25 mg total) by mouth 2 (two) times daily. 180 tablet 3  . hydrochlorothiazide (HYDRODIURIL) 25 MG tablet Take 1 tablet (25 mg total) by mouth daily. 30 tablet 11  . nitroGLYCERIN (NITROSTAT) 0.4 MG SL tablet Place 1 tablet (0.4 mg total) under the tongue every 5 (five) minutes as needed for chest pain (x 3 tabs daily). Reported on 07/15/2015 25 tablet 6  . potassium chloride (K-DUR) 10 MEQ tablet Take 1 tablet (10 mEq total) by mouth daily. 30 tablet 11  . spironolactone (ALDACTONE) 25 MG tablet Take 1 tablet (25 mg total) by mouth daily. 90 tablet 3  . VIAGRA 100 MG tablet TAKE 1 TABLET BY MOUTH DAILY AS NEEDED FOR ERECTILE DYSFUNCTION. 10 tablet 0   No current facility-administered medications for this visit.     Allergies:   Patient has no known allergies.    Social History:  The patient  reports that he has been smoking Cigarettes.  He has been smoking about 1.00 pack per day. He has never used smokeless tobacco. He reports that he drinks alcohol. He reports that he does not use drugs.   Family History:  The patient's family history includes Hypertension in his mother.    ROS:  Please see the history of present illness.    Review of Systems: Constitutional:  denies fever, chills, diaphoresis, appetite change and fatigue.  HEENT: denies photophobia, eye pain, redness, hearing loss, ear pain, congestion, sore throat, rhinorrhea, sneezing, neck pain, neck stiffness and tinnitus.  Respiratory: denies SOB, DOE, cough, chest tightness, and wheezing.  Cardiovascular: denies chest pain,  palpitations and leg swelling.  Gastrointestinal: denies nausea, vomiting, abdominal pain, diarrhea, constipation, blood in stool.  Genitourinary: denies dysuria, urgency, frequency, hematuria, flank pain and difficulty urinating.  Musculoskeletal: denies  myalgias, back pain, joint swelling, arthralgias and gait problem.   Skin: denies pallor, rash and wound.  Neurological: denies dizziness, seizures, syncope, weakness, light-headedness, numbness and headaches.   Hematological: denies adenopathy, easy bruising, personal or family bleeding history.  Psychiatric/ Behavioral: denies suicidal ideation, mood changes, confusion, nervousness, sleep disturbance and agitation.  All other systems are reviewed and negative.    PHYSICAL EXAM: VS:  BP 130/70 (BP Location: Right Arm, Patient Position: Sitting, Cuff Size: Normal)   Pulse 74   Ht 5' 10.5" (1.791 m)   Wt 162 lb (73.5 kg)   SpO2 98%   BMI 22.92 kg/m  , BMI Body mass index is 22.92 kg/m. GEN: Well nourished, well developed, in no acute distress  HEENT: normal  Neck: no JVD, carotid bruits, or masses Cardiac: RRR; 1-2 / 6 systolic murmur , no  rubs, or gallops,no edema  Respiratory:  clear to auscultation bilaterally, normal work of breathing GI: soft, nontender, nondistended, + BS MS: no deformity or atrophy  Skin: warm and dry, no rash Neuro:  Strength and sensation are intact Psych: normal   EKG:  EKG is not ordered today.     Recent Labs: 10/27/2015: ALT 24; Hemoglobin 15.6; Platelets 248 05/24/2016: BUN 15; Creat 1.41; Potassium 3.7; Sodium 139    Lipid Panel    Component Value Date/Time   CHOL 167 10/27/2015 1132   TRIG 162 (H) 10/27/2015 1132   HDL 53 10/27/2015 1132   CHOLHDL 3.2 10/27/2015 1132   VLDL 32 (H) 10/27/2015 1132   LDLCALC 82 10/27/2015 1132   LDLDIRECT 137.2 01/20/2013 1016      Wt Readings from Last 3 Encounters:  08/08/16 162 lb (73.5 kg)  05/09/16 160 lb 12.8 oz (72.9 kg)    10/27/15 150 lb 12.8 oz (68.4 kg)     Other studies Reviewed: Additional studies/ records that were reviewed today include: . Review of the above records demonstrates:    ASSESSMENT AND PLAN:  1. History of coronary artery disease-status post PTCA and stenting of his right coronary artery-08/23/2006 he has moderate disease involving his LAD.   He is not having any angina  2. Hypertension-  BP readings are  Well controlled   3. Hyperlipidemia - check fasting labs today  4. Murmur - has a soft  systolic murmur     Current medicines are reviewed at length with the patient today.  The patient does not have concerns regarding medicines.  The following changes have been made:  no change  Labs/ tests ordered today include:  No orders of the defined types were placed in this encounter.    Disposition:   FU with me in 3 months.       Kristeen Miss, MD  08/08/2016 8:45 AM    [facility] [address] [place], [place], [place]  [address] Phone: [phone]; Fax: [phone]

## 2016-08-09 ENCOUNTER — Other Ambulatory Visit: Payer: Self-pay | Admitting: *Deleted

## 2016-08-09 ENCOUNTER — Telehealth: Payer: Self-pay | Admitting: Cardiovascular Disease

## 2016-08-09 NOTE — Telephone Encounter (Signed)
PT AWARE OF LAB RESULTS./CY 

## 2016-08-09 NOTE — Telephone Encounter (Signed)
F/u message  Pt returning RN call about labs results. Please call back to discuss

## 2016-10-03 ENCOUNTER — Other Ambulatory Visit: Payer: Self-pay | Admitting: Cardiovascular Disease

## 2016-10-03 NOTE — Telephone Encounter (Signed)
Medication Detail    Disp Refills Start End   hydrochlorothiazide (HYDRODIURIL) 25 MG tablet 30 tablet 11 05/30/2016    Sig - Route: Take 1 tablet (25 mg total) by mouth daily. - Oral   E-Prescribing Status: Receipt confirmed by pharmacy (05/30/2016 10:22 AM EST)   Pharmacy   [facility] [phone] [facility] [employer] - [place], [place] - [address]

## 2017-03-09 ENCOUNTER — Ambulatory Visit: Payer: Self-pay | Admitting: Cardiovascular Disease

## 2017-03-09 ENCOUNTER — Encounter: Payer: Self-pay | Admitting: Cardiovascular Disease

## 2017-03-09 ENCOUNTER — Ambulatory Visit (INDEPENDENT_AMBULATORY_CARE_PROVIDER_SITE_OTHER): Payer: Self-pay | Admitting: Cardiovascular Disease

## 2017-03-09 VITALS — BP 140/78 | HR 84 | Resp 16 | Ht 70.0 in | Wt 151.8 lb

## 2017-03-09 DIAGNOSIS — I2510 Atherosclerotic heart disease of native coronary artery without angina pectoris: Secondary | ICD-10-CM

## 2017-03-09 DIAGNOSIS — I10 Essential (primary) hypertension: Secondary | ICD-10-CM

## 2017-03-09 NOTE — Progress Notes (Signed)
Cardiology Office Note   Date:  03/09/2017   ID:  Javier Rios, DOB 1950-10-22, MRN [id number]  PCP:  Patient, No Pcp Per  Cardiologist:   Kristeen Miss, MD   Chief Complaint  Patient presents with  . Hyperlipidemia  . Hypertension  . Coronary Artery Disease   1. History of coronary artery disease-status post PTCA and stenting of his right coronary artery-08/23/2006 he has moderate disease involving his LAD. 2. Hypertension 3. Hyperlipidemia   66 year old gentleman with a history of coronary artery disease. He is status post PTCA and stenting of his right coronary artery. He has a moderate stenosis in his LAD.  His last stress Myoview study was November, 2011. There is no evidence of anterior wall ischemia. He has a mild defect in his inferior wall which corresponds to his previous inferior basilar myocardial infarction.  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 He is not having any chest pain.  He has trouble with getting his meds. He is eating lots of bologna because it is inexpensive. His power to his apartment is off and he is unable to eat regular food. He has been going to [employer] and eating chicken biscuits.   Feb. 6, 2014:  He has stopped taking all medications. He states he is not able to afford them. 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 He ran out of his meds. He is still eating lots of salty foods ( bologna, fast foods)  January 20, 2013:  Javier Rios is staying active. 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 Has not been taking his HCTZ or zocor due to cost. He is still eating fast foods - although he is trying to cut back.   September 05, 2013:  Javier Rios is still doing well. Still working 2 jobs - he has been having some indigestion - started taking gaviscon which helps. He has had some dyspnea . He has not wanted to have a stress test due to cost. His insurance does  not cover much. He walks at work and typically is able to get to his work day without too much trouble.  Nov. 10, 2015:  Javier Rios is seen today for followup of his coronary artery disease and hypertension. His blood pressure is a little elevated today. He stayed up last night and watched the football game. He also ate some fried chicken which probably had some extra salt in it. His BP has been elevated for the past several office visits.    April 29 , 2016:  Javier Rios is a 66 y.o. male who presents for follow-up of his coronary artery disease. BP is  Elevated this am .  He has been working all night.    He brought his BP log.  His readings are all ok. His blood pressure is typically in the 120/75 range. His heart rate is typically in the 70-80 range.   Nov. 9, 2016:   BP is up today . Has a cold and has been taking some cold meds May have eaten a bit of extra salt recently .   May 17 ,2017:  Doing well. Went to the ER with the   Nov. 28, 2017:  Javier Rios is seen today for follow-up of his coronary artery disease, hypertension and congestive heart failure. BP is elevated  Still eating lots of salt  Still smoking  Has requested an inhaler  Feb. 27, 2018:  Doing ok Is taking his meds.   BP is well controlled   03/09/2017: Has not been working . Made him pay his money back. He started taking SS at age 19 but he made too much   Has stopped some / most of his meds.  Unsure exactly what he is taking .   Still smoking.   I advised him that he could afford his meds if he quit smoking .  He is still taking atorvastatin ,  HCTZ,  Is not taking Kdur or aldactone  Is having some leg cramps since running out of the kdur .    Past Medical History:  Diagnosis Date  . Chest pain   . Coronary artery disease    Status post PTCA and stenting of his right coronary artery in March, 2008. He also has a 40-50% LAD stenosis  . Hypertension   . LAD stenosis    MODERATE BETWEEN 50-60%  .  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 CARDIAC CATHETERIZATION  08/23/2006   EF 65%. THERE IS INFEROBASILAR AKINESIS  . CARDIOVASCULAR STRESS TEST  04/21/2010   EF 51%. NO SIGNIFICANT ST SEGMENT CHANGE SUGGESTIVE OF ISCHEMIA  . CORONARY ANGIOPLASTY WITH STENT PLACEMENT     STENTING OF HIS RCA     Current Outpatient Prescriptions  Medication Sig Dispense Refill  . albuterol (PROAIR HFA) 108 (90 Base) MCG/ACT inhaler Inhale 2 puffs into the lungs every 6 (six) hours as needed for wheezing or shortness of breath. 1 Inhaler 6  . amLODipine (NORVASC) 5 MG tablet TAKE ONE TABLET BY MOUTH ONE TIME DAILY 30 tablet 8  . aspirin 81 MG tablet Take 81 mg by mouth daily.      Marland Kitchen atorvastatin (LIPITOR) 40 MG tablet TAKE 1 TABLET (40 MG TOTAL) BY MOUTH DAILY. 90 tablet 1  . carvedilol (COREG) 6.25 MG tablet Take 1 tablet (6.25 mg total) by mouth 2 (two) times daily. 180 tablet 3  . hydrochlorothiazide (HYDRODIURIL) 25 MG tablet Take 1 tablet (25 mg total) by mouth daily. 30 tablet 11  . VIAGRA 100 MG tablet TAKE 1 TABLET BY MOUTH DAILY AS NEEDED FOR ERECTILE DYSFUNCTION. 10 tablet 0  . nitroGLYCERIN (NITROSTAT) 0.4 MG SL tablet Place 1 tablet (0.4 mg total) under the tongue every 5 (five) minutes as needed for chest pain (x 3 tabs daily). Reported on 07/15/2015 (Patient not taking: Reported on 03/09/2017) 25 tablet 6  . potassium chloride (K-DUR) 10 MEQ tablet Take 1 tablet (10 mEq total) by mouth daily. (Patient not taking: Reported on 03/09/2017) 90 tablet 3  . spironolactone (ALDACTONE) 25 MG tablet Take 1 tablet (25 mg total) by mouth daily. (Patient not taking: Reported on 03/09/2017) 90 tablet 3   No current facility-administered medications for this visit.     Allergies:   Patient has no known allergies.    Social History:  The patient  reports that he has been smoking Cigarettes.  He has been smoking about 1.00 pack per day. He has never used smokeless  tobacco. He reports that he drinks alcohol. He reports that he does not use drugs.   Family History:  The patient's family history includes Hypertension in his mother.    ROS:  Please see the history of present illness.    Review of Systems: Constitutional:  denies fever, chills, diaphoresis, appetite change and fatigue.  HEENT: denies photophobia, eye pain, redness,  hearing loss, ear pain, congestion, sore throat, rhinorrhea, sneezing, neck pain, neck stiffness and tinnitus.  Respiratory: denies SOB, DOE, cough, chest tightness, and wheezing.  Cardiovascular: denies chest pain, palpitations and leg swelling.  Gastrointestinal: denies nausea, vomiting, abdominal pain, diarrhea, constipation, blood in stool.  Genitourinary: denies dysuria, urgency, frequency, hematuria, flank pain and difficulty urinating.  Musculoskeletal: denies  myalgias, back pain, joint swelling, arthralgias and gait problem.   Skin: denies pallor, rash and wound.  Neurological: denies dizziness, seizures, syncope, weakness, light-headedness, numbness and headaches.   Hematological: denies adenopathy, easy bruising, personal or family bleeding history.  Psychiatric/ Behavioral: denies suicidal ideation, mood changes, confusion, nervousness, sleep disturbance and agitation.       All other systems are reviewed and negative.   Physical Exam: Blood pressure 140/78, pulse 84, resp. rate 16, height  (1.778 m), weight 151 lb 12.8 oz (68.9 kg), SpO2 98 %.  GEN:  Well nourished, well developed in no acute distress HEENT: Normal NECK: No JVD; No carotid bruits LYMPHATICS: No lymphadenopathy CARDIAC: RRR , no murmurs, rubs, gallops RESPIRATORY:  Clear to auscultation without rales, wheezing or rhonchi  ABDOMEN: Soft, non-tender, non-distended MUSCULOSKELETAL:  No edema; No deformity  SKIN: Warm and dry NEUROLOGIC:  Alert and oriented x 3   EKG:  EKG is ordered today.  Sept. 28, 2018 NSR at 77, RAE, TWI in  inf and lateral leads The TWI in the inferior leads is new . ? LVH with repol changes    Recent Labs: 08/08/2016: ALT 23; BUN 13; Creatinine, Ser 1.18; Potassium 4.0; Sodium 140    Lipid Panel    Component Value Date/Time   CHOL 192 08/08/2016 0859   TRIG 155 (H) 08/08/2016 0859   HDL 49 08/08/2016 0859   CHOLHDL 3.9 08/08/2016 0859   CHOLHDL 3.2 10/27/2015 1132   VLDL 32 (H) 10/27/2015 1132   LDLCALC 112 (H) 08/08/2016 0859   LDLDIRECT 137.2 01/20/2013 1016      Wt Readings from Last 3 Encounters:  03/09/17 151 lb 12.8 oz (68.9 kg)  08/08/16 162 lb (73.5 kg)  05/09/16 160 lb 12.8 oz (72.9 kg)     Other studies Reviewed: Additional studies/ records that were reviewed today include: . Review of the above records demonstrates:    ASSESSMENT AND PLAN:  1. History of coronary artery disease- He denies having any angina. He does have some T-wave inversions in the inferior and lateral leads. The inferior T-wave inversions are somewhat new. He denies having any chest pain. These may be due to left ventricular hypertrophy with repolarization abnormalities  2. Hypertension-  low pressures a little elevated. He's not been taking his medicines consistently.  3. Hyperlipidemia -  he says that he has been taking his Atorvastatin .  Check fasting labs today.  4. Murmur -  Stable, benign    Current medicines are reviewed at length with the patient today.  The patient does not have concerns regarding medicines.  The following changes have been made:  no change  Labs/ tests ordered today include:   Orders Placed This Encounter  Procedures  . EKG 12-Lead     Disposition:   FU with me in 6 months     Kristeen Miss, MD  03/09/2017 3:17 PM    [facility] [address] [place], [place], [place]  [address] Phone: [phone]; Fax: [phone]

## 2017-03-09 NOTE — Patient Instructions (Signed)
Medication Instructions:  Your physician recommends that you continue on your current medications as directed. Please refer to the Current Medication list given to you today.   Labwork: TODAY - cholesterol, basic metabolic panel, liver panel   Testing/Procedures: None Ordered   Follow-Up: Your physician wants you to follow-up in: 6 months with Dr. Nahser.  You will receive a reminder letter in the mail two months in advance. If you don't receive a letter, please call our office to schedule the follow-up appointment.   If you need a refill on your cardiac medications before your next appointment, please call your pharmacy.   Thank you for choosing [facility]! Jarryd Gratz, RN [phone]    

## 2017-03-10 LAB — LIPID PANEL
CHOL/HDL RATIO: 3.1 ratio (ref 0.0–5.0)
Cholesterol, Total: 174 mg/dL (ref 100–199)
HDL: 57 mg/dL (ref >39)
LDL CALC: 90 mg/dL (ref 0–99)
TRIGLYCERIDES: 134 mg/dL (ref 0–149)
VLDL Cholesterol Cal: 27 mg/dL (ref 5–40)

## 2017-03-10 LAB — BASIC METABOLIC PANEL
BUN / CREAT RATIO: 12 (ref 10–24)
BUN: 13 mg/dL (ref 8–27)
CALCIUM: 9.4 mg/dL (ref 8.6–10.2)
CO2: 27 mmol/L (ref 20–29)
Chloride: 100 mmol/L (ref 96–106)
Creatinine, Ser: 1.09 mg/dL (ref 0.76–1.27)
GFR calc Af Amer: 82 mL/min/{1.73_m2} (ref >59)
GFR calc non Af Amer: 71 mL/min/{1.73_m2} (ref >59)
GLUCOSE: 118 mg/dL — AB (ref 65–99)
POTASSIUM: 3.6 mmol/L (ref 3.5–5.2)
Sodium: 141 mmol/L (ref 134–144)

## 2017-03-10 LAB — HEPATIC FUNCTION PANEL
ALT: 19 IU/L (ref 0–44)
AST: 30 IU/L (ref 0–40)
Albumin: 4.5 g/dL (ref 3.6–4.8)
Alkaline Phosphatase: 89 IU/L (ref 39–117)
Bilirubin Total: 0.5 mg/dL (ref 0.0–1.2)
Bilirubin, Direct: 0.16 mg/dL (ref 0.00–0.40)
TOTAL PROTEIN: 6.9 g/dL (ref 6.0–8.5)

## 2017-06-04 ENCOUNTER — Other Ambulatory Visit: Payer: Self-pay | Admitting: Cardiovascular Disease

## 2017-06-06 ENCOUNTER — Other Ambulatory Visit: Payer: Self-pay | Admitting: Cardiovascular Disease

## 2017-07-05 ENCOUNTER — Other Ambulatory Visit: Payer: Self-pay | Admitting: Cardiovascular Disease

## 2017-08-09 ENCOUNTER — Other Ambulatory Visit: Payer: Self-pay | Admitting: Cardiovascular Disease

## 2017-09-05 ENCOUNTER — Other Ambulatory Visit: Payer: Self-pay | Admitting: Cardiovascular Disease

## 2017-09-06 MED ORDER — ALBUTEROL SULFATE HFA 108 (90 BASE) MCG/ACT IN AERS: INHALATION_SPRAY | RESPIRATORY_TRACT | 0 refills | Status: DC

## 2017-09-06 NOTE — Telephone Encounter (Signed)
Medication was resent to pharmacy because the transmission failed last time it was sent.. Confirmation received.

## 2017-09-06 NOTE — Telephone Encounter (Signed)
Pt's pharmacy is requesting a refill on albuterol. Would you like to refill this medication? Please address

## 2017-09-06 NOTE — Addendum Note (Signed)
Addended by: Demetrios LollBARNARD, CATHY C on: 09/06/2017 01:04 PM   Modules accepted: Orders

## 2017-09-07 ENCOUNTER — Other Ambulatory Visit: Payer: Self-pay

## 2017-09-07 ENCOUNTER — Other Ambulatory Visit: Payer: Self-pay | Admitting: Cardiovascular Disease

## 2017-09-07 MED ORDER — ALBUTEROL SULFATE HFA 108 (90 BASE) MCG/ACT IN AERS: INHALATION_SPRAY | RESPIRATORY_TRACT | 1 refills | Status: DC

## 2017-10-12 ENCOUNTER — Ambulatory Visit (INDEPENDENT_AMBULATORY_CARE_PROVIDER_SITE_OTHER): Payer: Medicare PPO | Admitting: Cardiovascular Disease

## 2017-10-12 ENCOUNTER — Encounter: Payer: Self-pay | Admitting: Cardiovascular Disease

## 2017-10-12 ENCOUNTER — Other Ambulatory Visit: Payer: Self-pay | Admitting: Cardiovascular Disease

## 2017-10-12 VITALS — BP 150/100 | HR 78 | Ht 70.0 in | Wt 153.8 lb

## 2017-10-12 DIAGNOSIS — E782 Mixed hyperlipidemia: Secondary | ICD-10-CM | POA: Diagnosis not present

## 2017-10-12 DIAGNOSIS — I10 Essential (primary) hypertension: Secondary | ICD-10-CM

## 2017-10-12 MED ORDER — CARVEDILOL 6.25 MG PO TABS: 6.2500 mg | ORAL_TABLET | Freq: Two times a day (BID) | ORAL | 11 refills | Status: DC

## 2017-10-12 MED ORDER — ALBUTEROL SULFATE HFA 108 (90 BASE) MCG/ACT IN AERS: INHALATION_SPRAY | RESPIRATORY_TRACT | 3 refills | Status: DC

## 2017-10-12 NOTE — Patient Instructions (Addendum)
Medication Instructions:  The current medical regimen is effective;  continue present plan and medications.  Follow-Up: Follow up in 6 months with PA/NP.  You will receive a letter in the mail 2 months before you are due.  Please call us when you receive this letter to schedule your follow up appointment.  If you need a refill on your cardiac medications before your next appointment, please call your pharmacy.  Thank you for choosing [facility]!!

## 2017-10-12 NOTE — Progress Notes (Signed)
Cardiology Office Note   Date:  10/12/2017   ID:  Javier Rios, DOB 13-Mar-1951, MRN [id number]  PCP:  Patient, No Pcp Per  Cardiologist:   Kristeen Miss, MD   Chief Complaint  Patient presents with  . Hypertension   1. History of coronary artery disease-status post PTCA and stenting of his right coronary artery-08/23/2006 he has moderate disease involving his LAD. 2. Hypertension 3. Hyperlipidemia   67 year old gentleman with a history of coronary artery disease. He is status post PTCA and stenting of his right coronary artery. He has a moderate stenosis in his LAD.  His last stress Myoview study was November, 2011. There is no evidence of anterior wall ischemia. He has a mild defect in his inferior wall which corresponds to his previous inferior basilar myocardial infarction.  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 He is not having any chest pain.  He has trouble with getting his meds. He is eating lots of bologna because it is inexpensive. His power to his apartment is off and he is unable to eat regular food. He has been going to [employer] and eating chicken biscuits.   Feb. 6, 2014:  He has stopped taking all medications. He states he is not able to afford them. 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 He ran out of his meds. He is still eating lots of salty foods ( bologna, fast foods)  January 20, 2013:  Javier Rios is staying active. 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 Has not been taking his HCTZ or zocor due to cost. He is still eating fast foods - although he is trying to cut back.   September 05, 2013:  Javier Rios is still doing well. Still working 2 jobs - he has been having some indigestion - started taking gaviscon which helps. He has had some dyspnea . He has not wanted to have a stress test due to cost. His insurance does not cover much. He walks at work and typically  is able to get to his work day without too much trouble.  Nov. 10, 2015:  Javier Rios is seen today for followup of his coronary artery disease and hypertension. His blood pressure is a little elevated today. He stayed up last night and watched the football game. He also ate some fried chicken which probably had some extra salt in it. His BP has been elevated for the past several office visits.    April 29 , 2016:  Javier Rios is a 67 y.o. male who presents for follow-up of his coronary artery disease. BP is  Elevated this am .  He has been working all night.    He brought his BP log.  His readings are all ok. His blood pressure is typically in the 120/75 range. His heart rate is typically in the 70-80 range.   Nov. 9, 2016:   BP is up today . Has a cold and has been taking some cold meds May have eaten a bit of extra salt recently .   May 17 ,2017:  Doing well. Went to the ER with the   Nov. 28, 2017:  Javier Rios is seen today for follow-up of his coronary artery disease, hypertension and congestive heart failure. BP is elevated  Still eating lots of salt  Still smoking  Has requested an inhaler   Feb. 27, 2018:  Doing ok Is  taking his meds.   BP is well controlled   03/09/2017: Has not been working . Made him pay his money back. He started taking SS at age 22 but he made too much   Has stopped some / most of his meds.  Unsure exactly what he is taking .   Still smoking.   I advised him that he could afford his meds if he quit smoking .  He is still taking atorvastatin ,  HCTZ,  Is not taking Kdur or aldactone  Is having some leg cramps since running out of the kdur .   Oct 12, 2017: Javier Rios seen today.  As usual, he is run out of some of his medications.  His blood pressure remains elevated.  He still smoking. Just got his medicaid card .   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  Chest pain   . Coronary artery disease    Status post PTCA and stenting of his right coronary artery in March, 2008. He also has a 40-50% LAD stenosis  . Hypertension   . LAD stenosis    MODERATE BETWEEN 50-60%  . 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 CARDIAC CATHETERIZATION  08/23/2006   EF 65%. THERE IS INFEROBASILAR AKINESIS  . CARDIOVASCULAR STRESS TEST  04/21/2010   EF 51%. NO SIGNIFICANT ST SEGMENT CHANGE SUGGESTIVE OF ISCHEMIA  . CORONARY ANGIOPLASTY WITH STENT PLACEMENT     STENTING OF HIS RCA     Current Outpatient Medications  Medication Sig Dispense Refill  . albuterol (PROAIR HFA) 108 (90 Base) MCG/ACT inhaler INHALE 2 PUFFS INTO THE LUNGS EVERY 6 (SIX) HOURS AS NEEDED FOR WHEEZING OR SHORTNESS OF BREATH. 1 Inhaler 3  . amLODipine (NORVASC) 5 MG tablet TAKE ONE TABLET BY MOUTH ONE TIME DAILY 30 tablet 5  . aspirin 81 MG tablet Take 81 mg by mouth daily.      Marland Kitchen atorvastatin (LIPITOR) 40 MG tablet Take 1 tablet (40 mg total) by mouth daily. 90 tablet 2  . carvedilol (COREG) 6.25 MG tablet Take 1 tablet (6.25 mg total) by mouth 2 (two) times daily. 30 tablet 11  . hydrochlorothiazide (HYDRODIURIL) 25 MG tablet TAKE 1 TABLET (25 MG TOTAL) BY MOUTH DAILY. 30 tablet 6  . VIAGRA 100 MG tablet TAKE 1 TABLET BY MOUTH DAILY AS NEEDED FOR ERECTILE DYSFUNCTION. 10 tablet 0   No current facility-administered medications for this visit.     Allergies:   Patient has no known allergies.    Social History:  The patient  reports that he has been smoking cigarettes.  He has been smoking about 1.00 pack per day. He has never used smokeless tobacco. He reports that he drinks alcohol. He reports that he does not use drugs.   Family History:  The patient's family history includes Hypertension in his mother.    ROS:  Noted in current hx     Physical Exam: Blood pressure (!) 150/100, pulse 78, height  (1.778 m), weight 153 lb 12.8 oz  (69.8 kg), SpO2 94 %.  GEN:  Well nourished, well developed in no acute distress HEENT: Normal NECK: No JVD; No carotid bruits LYMPHATICS: No lymphadenopathy CARDIAC: RRR   RESPIRATORY:  Clear to auscultation without rales, wheezing or rhonchi  ABDOMEN: Soft, non-tender, non-distended  MUSCULOSKELETAL:  No edema; No deformity  SKIN: Warm and dry NEUROLOGIC:  Alert and oriented x 3   EKG:      Recent Labs: 03/09/2017: ALT 19; BUN 13; Creatinine, Ser 1.09; Potassium 3.6; Sodium 141    Lipid Panel    Component Value Date/Time   CHOL 174 03/09/2017 1531   TRIG 134 03/09/2017 1531   HDL 57 03/09/2017 1531   CHOLHDL 3.1 03/09/2017 1531   CHOLHDL 3.2 10/27/2015 1132   VLDL 32 (H) 10/27/2015 1132   LDLCALC 90 03/09/2017 1531   LDLDIRECT 137.2 01/20/2013 1016      Wt Readings from Last 3 Encounters:  10/12/17 153 lb 12.8 oz (69.8 kg)  03/09/17 151 lb 12.8 oz (68.9 kg)  08/08/16 162 lb (73.5 kg)     Other studies Reviewed: Additional studies/ records that were reviewed today include: . Review of the above records demonstrates:    ASSESSMENT AND PLAN:  1. History of coronary artery disease-    No angina   2. Hypertension-  BP remains elevated.   Ran out of Coreg Still eats more salt than he should   3. Hyperlipidemia -    Labs have been stable.     4. Murmur -     Encouraged him to get a primary medical doctor.  I have given him  and wellness.  He will give him a call.  Current medicines are reviewed at length with the patient today.  The patient does not have concerns regarding medicines.  The following changes have been made:  no change  Labs/ tests ordered today include:   No orders of the defined types were placed in this encounter.    Disposition:   To see my APP in 6 months,   Will see me in 1 year     Kristeen Miss, MD  10/12/2017 10:10 AM    [facility] [address] [place], [place], [place]  [address] Phone: [phone]; Fax: [phone]

## 2017-10-12 NOTE — Telephone Encounter (Signed)
Dr. Elease Hashimoto, ok to fill?  You seen pt today.

## 2017-10-12 NOTE — Telephone Encounter (Signed)
Okay to refill? Please advise. Thanks, MI 

## 2018-04-08 ENCOUNTER — Other Ambulatory Visit: Payer: Self-pay | Admitting: Cardiovascular Disease

## 2018-04-12 ENCOUNTER — Encounter: Payer: Self-pay | Admitting: Cardiovascular Disease

## 2018-04-12 ENCOUNTER — Ambulatory Visit (INDEPENDENT_AMBULATORY_CARE_PROVIDER_SITE_OTHER): Payer: Medicare HMO | Admitting: Cardiovascular Disease

## 2018-04-12 VITALS — BP 182/102 | HR 65 | Ht 70.0 in | Wt 156.0 lb

## 2018-04-12 DIAGNOSIS — I119 Hypertensive heart disease without heart failure: Secondary | ICD-10-CM | POA: Diagnosis not present

## 2018-04-12 DIAGNOSIS — I10 Essential (primary) hypertension: Secondary | ICD-10-CM

## 2018-04-12 DIAGNOSIS — I2510 Atherosclerotic heart disease of native coronary artery without angina pectoris: Secondary | ICD-10-CM | POA: Diagnosis not present

## 2018-04-12 LAB — LIPID PANEL
Chol/HDL Ratio: 3.3 ratio (ref 0.0–5.0)
Cholesterol, Total: 175 mg/dL (ref 100–199)
HDL: 53 mg/dL (ref >39)
LDL Calculated: 99 mg/dL (ref 0–99)
Triglycerides: 114 mg/dL (ref 0–149)
VLDL Cholesterol Cal: 23 mg/dL (ref 5–40)

## 2018-04-12 LAB — HEPATIC FUNCTION PANEL
ALT: 15 IU/L (ref 0–44)
AST: 22 IU/L (ref 0–40)
Albumin: 4.6 g/dL (ref 3.6–4.8)
Alkaline Phosphatase: 89 IU/L (ref 39–117)
Bilirubin Total: 0.4 mg/dL (ref 0.0–1.2)
Bilirubin, Direct: 0.12 mg/dL (ref 0.00–0.40)
Total Protein: 6.9 g/dL (ref 6.0–8.5)

## 2018-04-12 LAB — BASIC METABOLIC PANEL
BUN / CREAT RATIO: 11 (ref 10–24)
BUN: 13 mg/dL (ref 8–27)
CO2: 24 mmol/L (ref 20–29)
Calcium: 9.9 mg/dL (ref 8.6–10.2)
Chloride: 98 mmol/L (ref 96–106)
Creatinine, Ser: 1.15 mg/dL (ref 0.76–1.27)
GFR calc non Af Amer: 66 mL/min/{1.73_m2} (ref >59)
GFR, EST AFRICAN AMERICAN: 77 mL/min/{1.73_m2} (ref >59)
Glucose: 106 mg/dL — ABNORMAL HIGH (ref 65–99)
POTASSIUM: 4.6 mmol/L (ref 3.5–5.2)
SODIUM: 137 mmol/L (ref 134–144)

## 2018-04-12 MED ORDER — NITROGLYCERIN 0.4 MG SL SUBL: 0.4000 mg | SUBLINGUAL_TABLET | SUBLINGUAL | 6 refills | Status: DC | PRN

## 2018-04-12 MED ORDER — SPIRONOLACTONE 25 MG PO TABS: 25.0000 mg | ORAL_TABLET | Freq: Every day | ORAL | 3 refills | Status: DC

## 2018-04-12 NOTE — Patient Instructions (Addendum)
Medication Instructions:  Your physician has recommended you make the following change in your medication:   START Spironolactone (Aldactone) 25 mg once daily  If you need a refill on your cardiac medications before your next appointment, please call your pharmacy.   Lab work: TODAY - cholesterol, liver panel, basic metabolic panel  Your physician recommends that you return for lab work in: 3 weeks for basic metabolic panel  If you have labs (blood work) drawn today and your tests are completely normal, you will receive your results only by: Marland Kitchen MyChart Message (if you have MyChart) OR . A paper copy in the mail If you have any lab test that is abnormal or we need to change your treatment, we will call you to review the results.   Testing/Procedures: Your physician has requested that you have an echocardiogram. Echocardiography is a painless test that uses sound waves to create images of your heart. It provides your doctor with information about the size and shape of your heart and how well your heart's chambers and valves are working. This procedure takes approximately one hour. There are no restrictions for this procedure.   Follow-Up: Your physician recommends that you schedule a follow-up appointment in: 2-3 weeks for Nurse Visit/BMET on a day Dr. Elease Hashimoto is in the office   At [facility], you and your health needs are our priority.  As part of our continuing mission to provide you with exceptional heart care, we have created designated Provider Care Teams.  These Care Teams include your primary Cardiologist (physician) and Advanced Practice Providers (APPs -  Physician Assistants and Nurse Practitioners) who all work together to provide you with the care you need, when you need it. You will need a follow up appointment in:  6 months.  Please call our office 2 months in advance to schedule this appointment.  You may see Dr. Elease Hashimoto or one of the following Advanced Practice Providers on  your designated Care Team: Tereso Newcomer, PA-C Vin Campbell, [place] . Berton Bon, NP

## 2018-04-12 NOTE — Progress Notes (Signed)
Cardiology Office Note   Date:  04/12/2018   ID:  Javier Rios, DOB [birth year], MRN [id number]  PCP:  Patient, No Pcp Per  Cardiologist:   Kristeen Miss, MD   Chief Complaint  Patient presents with  . Hypertension   1. History of coronary artery disease-status post PTCA and stenting of his right coronary artery-08/23/2006 he has moderate disease involving his LAD. 2. Hypertension 3. Hyperlipidemia   67 year old gentleman with a history of coronary artery disease. He is status post PTCA and stenting of his right coronary artery. He has a moderate stenosis in his LAD.  His last stress Myoview study was November, 2011. There is no evidence of anterior wall ischemia. He has a mild defect in his inferior wall which corresponds to his previous inferior basilar myocardial infarction.  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 He is not having any chest pain.  He has trouble with getting his meds. He is eating lots of bologna because it is inexpensive. His power to his apartment is off and he is unable to eat regular food. He has been going to [employer] and eating chicken biscuits.   Feb. 6, 2014:  He has stopped taking all medications. He states he is not able to afford them. 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 He ran out of his meds. He is still eating lots of salty foods ( bologna, fast foods)  January 20, 2013:  Javier Rios is staying active. 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 Has not been taking his HCTZ or zocor due to cost. He is still eating fast foods - although he is trying to cut back.   September 05, 2013:  Javier Rios is still doing well. Still working 2 jobs - he has been having some indigestion - started taking gaviscon which helps. He has had some dyspnea . He has not wanted to have a stress test due to cost. His insurance does not cover much. He walks at work and typically  is able to get to his work day without too much trouble.  Nov. 10, 2015:  Javier Rios is seen today for followup of his coronary artery disease and hypertension. His blood pressure is a little elevated today. He stayed up last night and watched the football game. He also ate some fried chicken which probably had some extra salt in it. His BP has been elevated for the past several office visits.    April 29 , 2016:  Javier Rios is a 67 y.o. male who presents for follow-up of his coronary artery disease. BP is  Elevated this am .  He has been working all night.    He brought his BP log.  His readings are all ok. His blood pressure is typically in the 120/75 range. His heart rate is typically in the 70-80 range.   Nov. 9, 2016:   BP is up today . Has a cold and has been taking some cold meds May have eaten a bit of extra salt recently .   May 17 ,2017:  Doing well. Went to the ER with the   Nov. 28, 2017:  Javier Rios is seen today for follow-up of his coronary artery disease, hypertension and congestive heart failure. BP is elevated  Still eating lots of salt  Still smoking  Has requested an inhaler   Feb. 27, 2018:  Doing ok Is  taking his meds.   BP is well controlled   03/09/2017: Has not been working . Made him pay his money back. He started taking SS at age 51 but he made too much   Has stopped some / most of his meds.  Unsure exactly what he is taking .   Still smoking.   I advised him that he could afford his meds if he quit smoking .  He is still taking atorvastatin ,  HCTZ,  Is not taking Kdur or aldactone  Is having some leg cramps since running out of the kdur .   Oct 12, 2017: Javier Rios seen today.  As usual, he is run out of some of his medications.  His blood pressure remains elevated.  He still smoking. Just got his medicaid card .   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 1, 2019: Javier Rios is doing well. BP is a bit  high Has been eating more salt recently  Still smoking . Has cut back some    Past Medical History:  Diagnosis Date  . Chest pain   . Coronary artery disease    Status post PTCA and stenting of his right coronary artery in March, 2008. He also has a 40-50% LAD stenosis  . Hypertension   . LAD stenosis    MODERATE BETWEEN 50-60%  . 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 CARDIAC CATHETERIZATION  08/23/2006   EF 65%. THERE IS INFEROBASILAR AKINESIS  . CARDIOVASCULAR STRESS TEST  04/21/2010   EF 51%. NO SIGNIFICANT ST SEGMENT CHANGE SUGGESTIVE OF ISCHEMIA  . CORONARY ANGIOPLASTY WITH STENT PLACEMENT     STENTING OF HIS RCA     Current Outpatient Medications  Medication Sig Dispense Refill  . albuterol (PROAIR HFA) 108 (90 Base) MCG/ACT inhaler INHALE 2 PUFFS INTO THE LUNGS EVERY 6 (SIX) HOURS AS NEEDED FOR WHEEZING OR SHORTNESS OF BREATH. 1 Inhaler 3  . amLODipine (NORVASC) 5 MG tablet TAKE ONE TABLET BY MOUTH ONE TIME DAILY 30 tablet 5  . aspirin 81 MG tablet Take 81 mg by mouth daily.      Marland Kitchen atorvastatin (LIPITOR) 40 MG tablet Take 1 tablet (40 mg total) by mouth daily. 90 tablet 2  . carvedilol (COREG) 6.25 MG tablet Take 1 tablet (6.25 mg total) by mouth 2 (two) times daily. 30 tablet 11  . hydrochlorothiazide (HYDRODIURIL) 25 MG tablet TAKE 1 TABLET (25 MG TOTAL) BY MOUTH DAILY. 90 tablet 1  . sildenafil (VIAGRA) 100 MG tablet TAKE 1 TABLET BY MOUTH DAILY AS NEEDED FOR ERECTILE DYSFUNCTION. 10 tablet 10  . nitroGLYCERIN (NITROSTAT) 0.4 MG SL tablet Place 1 tablet (0.4 mg total) under the tongue every 5 (five) minutes as needed for chest pain. 25 tablet 6  . spironolactone (ALDACTONE) 25 MG tablet Take 1 tablet (25 mg total) by mouth daily. 90 tablet 3   No current facility-administered medications for this visit.     Allergies:   Patient has no known allergies.    Social History:  The patient  reports that he has been smoking  cigarettes. He has been smoking about 1.00 pack per day. He has never used smokeless tobacco. He reports that he drinks alcohol. He reports that he does not use drugs.   Family History:  The patient's family history includes Hypertension in his mother.  ROS:  Noted in current hx     Physical Exam: Blood pressure (!) 182/102, pulse 65, height 5\' 10"  (1.778 m), weight 156 lb (70.8 kg), SpO2 99 %.  GEN:  Well nourished, well developed in no acute distress HEENT: Normal NECK: No JVD; No carotid bruits LYMPHATICS: No lymphadenopathy CARDIAC: RRR  RESPIRATORY:  Clear to auscultation without rales, wheezing or rhonchi  ABDOMEN: Soft, non-tender, non-distended MUSCULOSKELETAL:  No edema; No deformity  SKIN: Warm and dry NEUROLOGIC:  Alert and oriented x 3   EKG:   April 12, 2018: Normal sinus rhythm at 65.  Voltage criteria for left ventricular hypertrophy.  Previous inferior wall myocardial infarction.   Recent Labs: No results found for requested labs within last 8760 hours.    Lipid Panel    Component Value Date/Time   CHOL 174 03/09/2017 1531   TRIG 134 03/09/2017 1531   HDL 57 03/09/2017 1531   CHOLHDL 3.1 03/09/2017 1531   CHOLHDL 3.2 10/27/2015 1132   VLDL 32 (H) 10/27/2015 1132   LDLCALC 90 03/09/2017 1531   LDLDIRECT 137.2 01/20/2013 1016      Wt Readings from Last 3 Encounters:  04/12/18 156 lb (70.8 kg)  10/12/17 153 lb 12.8 oz (69.8 kg)  03/09/17 151 lb 12.8 oz (68.9 kg)     Other studies Reviewed: Additional studies/ records that were reviewed today include: . Review of the above records demonstrates:    ASSESSMENT AND PLAN:  1. History of coronary artery disease-     not had any episodes of angina. Check labs today including lipid profile, liver enzymes, basic metabolic profile  2.  Hypertensive heart disease without congestive heart failure   pressure remains mildly elevated.  Add spironolactone 25 mg a day.  We will get a nurse visit blood  pressure check in 2 to [redacted] weeks along with a basic metabolic profile.  He has had some shortness of breath on occasion.  He is had hypertension for a long time.  I think that it would be important for [place] to evaluate his left ventricular systolic function.  We will get an echocardiogram.  3. Hyperlipidemia -     .   Fasting lipids today.     Encouraged him to get a primary medical doctor.  I have given him [place] and wellness.  He will give him a call.  Current medicines are reviewed at length with the patient today.  The patient does not have concerns regarding medicines.  The following changes have been made:  no change  Labs/ tests ordered today include:   Orders Placed This Encounter  Procedures  . Basic Metabolic Panel (BMET)  . Basic Metabolic Panel (BMET)  . Hepatic function panel  . Lipid Profile  . EKG 12-Lead  . ECHOCARDIOGRAM COMPLETE     Disposition:   Office visit in 6 months    Kristeen Miss, MD  04/12/2018 11:19 AM    [facility] [address] [place], [place], [place]  [address] Phone: [phone]; Fax: [phone]

## 2018-04-15 ENCOUNTER — Telehealth: Payer: Self-pay | Admitting: Nurse Practitioner

## 2018-04-15 ENCOUNTER — Other Ambulatory Visit: Payer: Self-pay | Admitting: Nurse Practitioner

## 2018-04-15 DIAGNOSIS — E782 Mixed hyperlipidemia: Secondary | ICD-10-CM

## 2018-04-15 MED ORDER — ROSUVASTATIN CALCIUM 10 MG PO TABS: 10.0000 mg | ORAL_TABLET | Freq: Every day | ORAL | 3 refills | Status: DC

## 2018-04-15 NOTE — Telephone Encounter (Signed)
Lab results and plan of care reviewed with patient who verbalized understanding. He agrees to stop Atorvastatin when he finishes current bottle and will start Rosuvastatin. I advised we will recheck lab work in 3 months. He voiced concerns about taking ibuprofen with spironolactone after reading the bottle. I explained that he can take ibuprofen prn but should avoid taking it continuously and that we are monitoring his kidney function. I advised him to keep upcoming appointments for 11/15 for echo, BP check and lab and he thanked me for the call.

## 2018-04-15 NOTE — Telephone Encounter (Signed)
-----   Message from Vesta Mixer, MD sent at 04/15/2018  8:30 AM EST ----- LDL is still a bit higher than we want given his hx of CAD DC Atorvastatin when he runs out. Start Rosuvastatin 10 mg a day and check lipids, liver enz and BMP 2-3 months after he makes the switch

## 2018-04-26 ENCOUNTER — Other Ambulatory Visit: Payer: Self-pay

## 2018-04-26 ENCOUNTER — Other Ambulatory Visit: Payer: Medicare HMO | Admitting: *Deleted

## 2018-04-26 ENCOUNTER — Ambulatory Visit (INDEPENDENT_AMBULATORY_CARE_PROVIDER_SITE_OTHER): Payer: Medicare HMO | Admitting: *Deleted

## 2018-04-26 ENCOUNTER — Ambulatory Visit (HOSPITAL_COMMUNITY): Payer: Medicare HMO | Attending: Cardiovascular Disease

## 2018-04-26 VITALS — BP 144/90 | HR 76 | Ht 70.0 in | Wt 156.0 lb

## 2018-04-26 DIAGNOSIS — I10 Essential (primary) hypertension: Secondary | ICD-10-CM

## 2018-04-26 DIAGNOSIS — I119 Hypertensive heart disease without heart failure: Secondary | ICD-10-CM

## 2018-04-26 DIAGNOSIS — I2510 Atherosclerotic heart disease of native coronary artery without angina pectoris: Secondary | ICD-10-CM | POA: Diagnosis not present

## 2018-04-26 LAB — BASIC METABOLIC PANEL
BUN / CREAT RATIO: 14 (ref 10–24)
BUN: 17 mg/dL (ref 8–27)
CHLORIDE: 96 mmol/L (ref 96–106)
CO2: 25 mmol/L (ref 20–29)
CREATININE: 1.21 mg/dL (ref 0.76–1.27)
Calcium: 10.3 mg/dL — ABNORMAL HIGH (ref 8.6–10.2)
GFR calc non Af Amer: 62 mL/min/{1.73_m2} (ref >59)
GFR, EST AFRICAN AMERICAN: 72 mL/min/{1.73_m2} (ref >59)
Glucose: 98 mg/dL (ref 65–99)
POTASSIUM: 4.3 mmol/L (ref 3.5–5.2)
SODIUM: 136 mmol/L (ref 134–144)

## 2018-04-26 NOTE — Progress Notes (Signed)
Original  B/p was 158/100 after few minutes rechecked and was a little lower see v/s flow sheet .B/p yesterday and day before is running around 142-152/80-87. Also after reviewing med list pt had not been taking carvedilol  6.25 mg twice daily only once daily Discussed with Dr Elease HashimotoNahser have pt continue current meds and needs to take carvedilol twice daily  Encourage to monitor b/p and call if b/p seems to be running high consistently Pt verbalized understanding ./cy

## 2018-04-30 ENCOUNTER — Telehealth: Payer: Self-pay | Admitting: Cardiovascular Disease

## 2018-04-30 NOTE — Telephone Encounter (Signed)
-----   Message from Vesta MixerPhilip J Nahser, MD sent at 04/26/2018  4:00 PM EST ----- Labs are ok

## 2018-04-30 NOTE — Telephone Encounter (Signed)
-----   Message from Vesta MixerPhilip J Nahser, MD sent at 04/26/2018  4:03 PM EST ----- LV function is mildly depression Encourage him to continue with medications

## 2018-04-30 NOTE — Telephone Encounter (Signed)
The patient has been notified of echo and lab results and verbalized understanding.  All questions (if any) were answered.

## 2018-04-30 NOTE — Telephone Encounter (Signed)
Follow up:    Patient calling concerning some results. Please call patient.

## 2018-05-27 ENCOUNTER — Emergency Department (HOSPITAL_COMMUNITY): Payer: Medicare HMO

## 2018-05-27 ENCOUNTER — Emergency Department (HOSPITAL_COMMUNITY)
Admission: EM | Admit: 2018-05-27 | Discharge: 2018-05-28 | Disposition: A | Discharge status: Home / Self Care | Payer: Medicare HMO | Attending: Emergency Medicine | Admitting: Emergency Medicine

## 2018-05-27 ENCOUNTER — Encounter (HOSPITAL_COMMUNITY): Payer: Self-pay | Admitting: Emergency Medicine

## 2018-05-27 DIAGNOSIS — R200 Anesthesia of skin: Secondary | ICD-10-CM | POA: Insufficient documentation

## 2018-05-27 DIAGNOSIS — R079 Chest pain, unspecified: Secondary | ICD-10-CM | POA: Diagnosis not present

## 2018-05-27 DIAGNOSIS — Z5321 Procedure and treatment not carried out due to patient leaving prior to being seen by health care provider: Secondary | ICD-10-CM | POA: Diagnosis not present

## 2018-05-27 DIAGNOSIS — R110 Nausea: Secondary | ICD-10-CM | POA: Insufficient documentation

## 2018-05-27 LAB — CBC
HCT: 45.2 % (ref 39.0–52.0)
HEMOGLOBIN: 14.7 g/dL (ref 13.0–17.0)
MCH: 30.4 pg (ref 26.0–34.0)
MCHC: 32.5 g/dL (ref 30.0–36.0)
MCV: 93.4 fL (ref 80.0–100.0)
Platelets: 244 10*3/uL (ref 150–400)
RBC: 4.84 MIL/uL (ref 4.22–5.81)
RDW: 13.6 % (ref 11.5–15.5)
WBC: 5.6 10*3/uL (ref 4.0–10.5)
nRBC: 0 % (ref 0.0–0.2)

## 2018-05-27 LAB — I-STAT TROPONIN, ED: Troponin i, poc: 0 ng/mL (ref 0.00–0.08)

## 2018-05-27 LAB — BASIC METABOLIC PANEL
Anion gap: 12 (ref 5–15)
BUN: 14 mg/dL (ref 8–23)
CO2: 24 mmol/L (ref 22–32)
Calcium: 9.4 mg/dL (ref 8.9–10.3)
Chloride: 104 mmol/L (ref 98–111)
Creatinine, Ser: 1.23 mg/dL (ref 0.61–1.24)
GFR calc Af Amer: >60 mL/min (ref >60)
GFR calc non Af Amer: >60 mL/min (ref >60)
Glucose, Bld: 99 mg/dL (ref 70–99)
Potassium: 3.6 mmol/L (ref 3.5–5.1)
Sodium: 140 mmol/L (ref 135–145)

## 2018-05-27 NOTE — ED Triage Notes (Addendum)
Pt states he was nauseated yesterday. He has had tongue numbness since 9 this morning with some pain to the jaw. Denies any chest pain. States when he had a heart attack in the past he had the same symptoms involving his tongue tingling and jaw pain. Pt has no neuro deficits, no swelling to tongue and no trouble swallowing. Pt states the tongue tingling started when he was doing yard work and states it feels like when he had his heart attack.

## 2018-05-27 NOTE — ED Notes (Signed)
Pt. Stated "were going to leave for tonight and follow up with our cardiologist in the morning." Puling pt. OTF.

## 2018-07-19 ENCOUNTER — Other Ambulatory Visit: Payer: Medicare HMO | Admitting: *Deleted

## 2018-07-19 DIAGNOSIS — E782 Mixed hyperlipidemia: Secondary | ICD-10-CM | POA: Diagnosis not present

## 2018-07-20 LAB — HEPATIC FUNCTION PANEL
ALT: 20 IU/L (ref 0–44)
AST: 23 IU/L (ref 0–40)
Albumin: 4.6 g/dL (ref 3.8–4.8)
Alkaline Phosphatase: 70 IU/L (ref 39–117)
Bilirubin Total: 0.7 mg/dL (ref 0.0–1.2)
Bilirubin, Direct: 0.18 mg/dL (ref 0.00–0.40)
Total Protein: 7.4 g/dL (ref 6.0–8.5)

## 2018-07-20 LAB — LIPID PANEL
Chol/HDL Ratio: 4.4 ratio (ref 0.0–5.0)
Cholesterol, Total: 224 mg/dL — ABNORMAL HIGH (ref 100–199)
HDL: 51 mg/dL (ref >39)
LDL CALC: 145 mg/dL — AB (ref 0–99)
Triglycerides: 141 mg/dL (ref 0–149)
VLDL Cholesterol Cal: 28 mg/dL (ref 5–40)

## 2018-07-20 LAB — BASIC METABOLIC PANEL
BUN/Creatinine Ratio: 10 (ref 10–24)
BUN: 15 mg/dL (ref 8–27)
CHLORIDE: 97 mmol/L (ref 96–106)
CO2: 22 mmol/L (ref 20–29)
Calcium: 10.1 mg/dL (ref 8.6–10.2)
Creatinine, Ser: 1.44 mg/dL — ABNORMAL HIGH (ref 0.76–1.27)
GFR, EST AFRICAN AMERICAN: 58 mL/min/{1.73_m2} — AB (ref >59)
GFR, EST NON AFRICAN AMERICAN: 51 mL/min/{1.73_m2} — AB (ref >59)
Glucose: 91 mg/dL (ref 65–99)
Potassium: 4.3 mmol/L (ref 3.5–5.2)
Sodium: 139 mmol/L (ref 134–144)

## 2018-09-06 ENCOUNTER — Ambulatory Visit: Payer: Medicare HMO | Admitting: Family Medicine

## 2018-09-09 ENCOUNTER — Telehealth: Payer: Self-pay

## 2018-09-09 NOTE — Telephone Encounter (Signed)
Pt returning your call

## 2018-09-09 NOTE — Telephone Encounter (Signed)
Left a message for pt to call back about his upcoming appt. Advised him to call back about his options to keep his appt.

## 2018-09-10 ENCOUNTER — Telehealth: Payer: Self-pay | Admitting: Cardiovascular Disease

## 2018-09-10 NOTE — Telephone Encounter (Signed)
Left message on daughter's phone Kendrick Fries) to call back regarding Mr. Hutsell's upcoming appointment with Dr. Elease Hashimoto.  I asked her to ask for Era Bumpers when she calls the office.

## 2018-09-11 ENCOUNTER — Telehealth: Payer: Self-pay

## 2018-09-11 NOTE — Telephone Encounter (Signed)
   Primary Cardiologist:  Kristeen Miss, MD   Patient contacted.  History reviewed.  No symptoms to suggest any unstable cardiac conditions.  Based on discussion, with current pandemic situation, we will be postponing this appointment for Javier Rios with a plan for f/u in 6-12 wks or sooner if feasible/necessary.  If symptoms change, he has been instructed to contact our office.   Routing to C19 CANCEL pool for tracking (P CV DIV CV19 CANCEL - reason for visit "other.") and assigning priority (1 = 4-6 wks, 2 = 6-12 wks, 3 = >12 wks).   [name], CMA  09/11/2018 2:22 PM         .

## 2018-09-13 ENCOUNTER — Ambulatory Visit: Payer: Medicare HMO | Admitting: Cardiovascular Disease

## 2018-09-13 ENCOUNTER — Other Ambulatory Visit: Payer: Self-pay | Admitting: Cardiovascular Disease

## 2018-09-13 NOTE — Telephone Encounter (Signed)
[facility] is requesting a refill sildenafil and pt would like to get 50 mg instead 100 mg. Please address

## 2018-09-14 NOTE — Telephone Encounter (Signed)
[facility] to fill Sildenafil 50 mg tabs,  # 20 3 refills

## 2018-09-16 MED ORDER — SILDENAFIL CITRATE 50 MG PO TABS: 50.0000 mg | ORAL_TABLET | Freq: Every day | ORAL | 3 refills | Status: DC | PRN

## 2018-09-16 NOTE — Telephone Encounter (Signed)
Pt's medication was sent to pt's pharmacy as requested. Confirmation received.  °

## 2018-09-26 ENCOUNTER — Telehealth: Payer: Self-pay | Admitting: Cardiovascular Disease

## 2018-09-26 MED ORDER — AMLODIPINE BESYLATE 5 MG PO TABS: 5.0000 mg | ORAL_TABLET | Freq: Every day | ORAL | 1 refills | Status: DC

## 2018-09-26 MED ORDER — ROSUVASTATIN CALCIUM 10 MG PO TABS: 10.0000 mg | ORAL_TABLET | ORAL | 3 refills | Status: DC

## 2018-09-26 NOTE — Telephone Encounter (Signed)
Patient states he prefers to reschedule appointment with Dr. Elease Hashimoto in August rather than schedule a virtual visit. Patient states rosuvastatin 10 mg daily caused him to have a lot of cramps in his legs and to hurt in his bones. We discussed his options and he states he will try taking the medication every other day. I advised him to call back before August if he is unable to tolerate. He verbalized understanding and agreement and thanked me for the call.

## 2018-09-26 NOTE — Telephone Encounter (Signed)
Left message for patient to call back to reschedule March appointment.

## 2018-09-26 NOTE — Telephone Encounter (Signed)
Follow up  ° ° °Pt is returning call  ° ° °Please call back  °

## 2018-11-07 ENCOUNTER — Other Ambulatory Visit: Payer: Self-pay | Admitting: Cardiovascular Disease

## 2018-11-30 ENCOUNTER — Other Ambulatory Visit: Payer: Self-pay | Admitting: Cardiovascular Disease

## 2019-01-16 ENCOUNTER — Ambulatory Visit: Payer: Medicare HMO | Admitting: Cardiovascular Disease

## 2019-03-01 ENCOUNTER — Other Ambulatory Visit: Payer: Self-pay | Admitting: Cardiovascular Disease

## 2019-03-02 ENCOUNTER — Other Ambulatory Visit: Payer: Self-pay | Admitting: Cardiovascular Disease

## 2019-03-30 IMAGING — CR DG CHEST 2V
2 series · 2 of 2 positions shown · non-contrast
Comparison: 07/15/2015.

CLINICAL DATA: Chest pain.

EXAM:
CHEST - 2 VIEW

[chest pa]
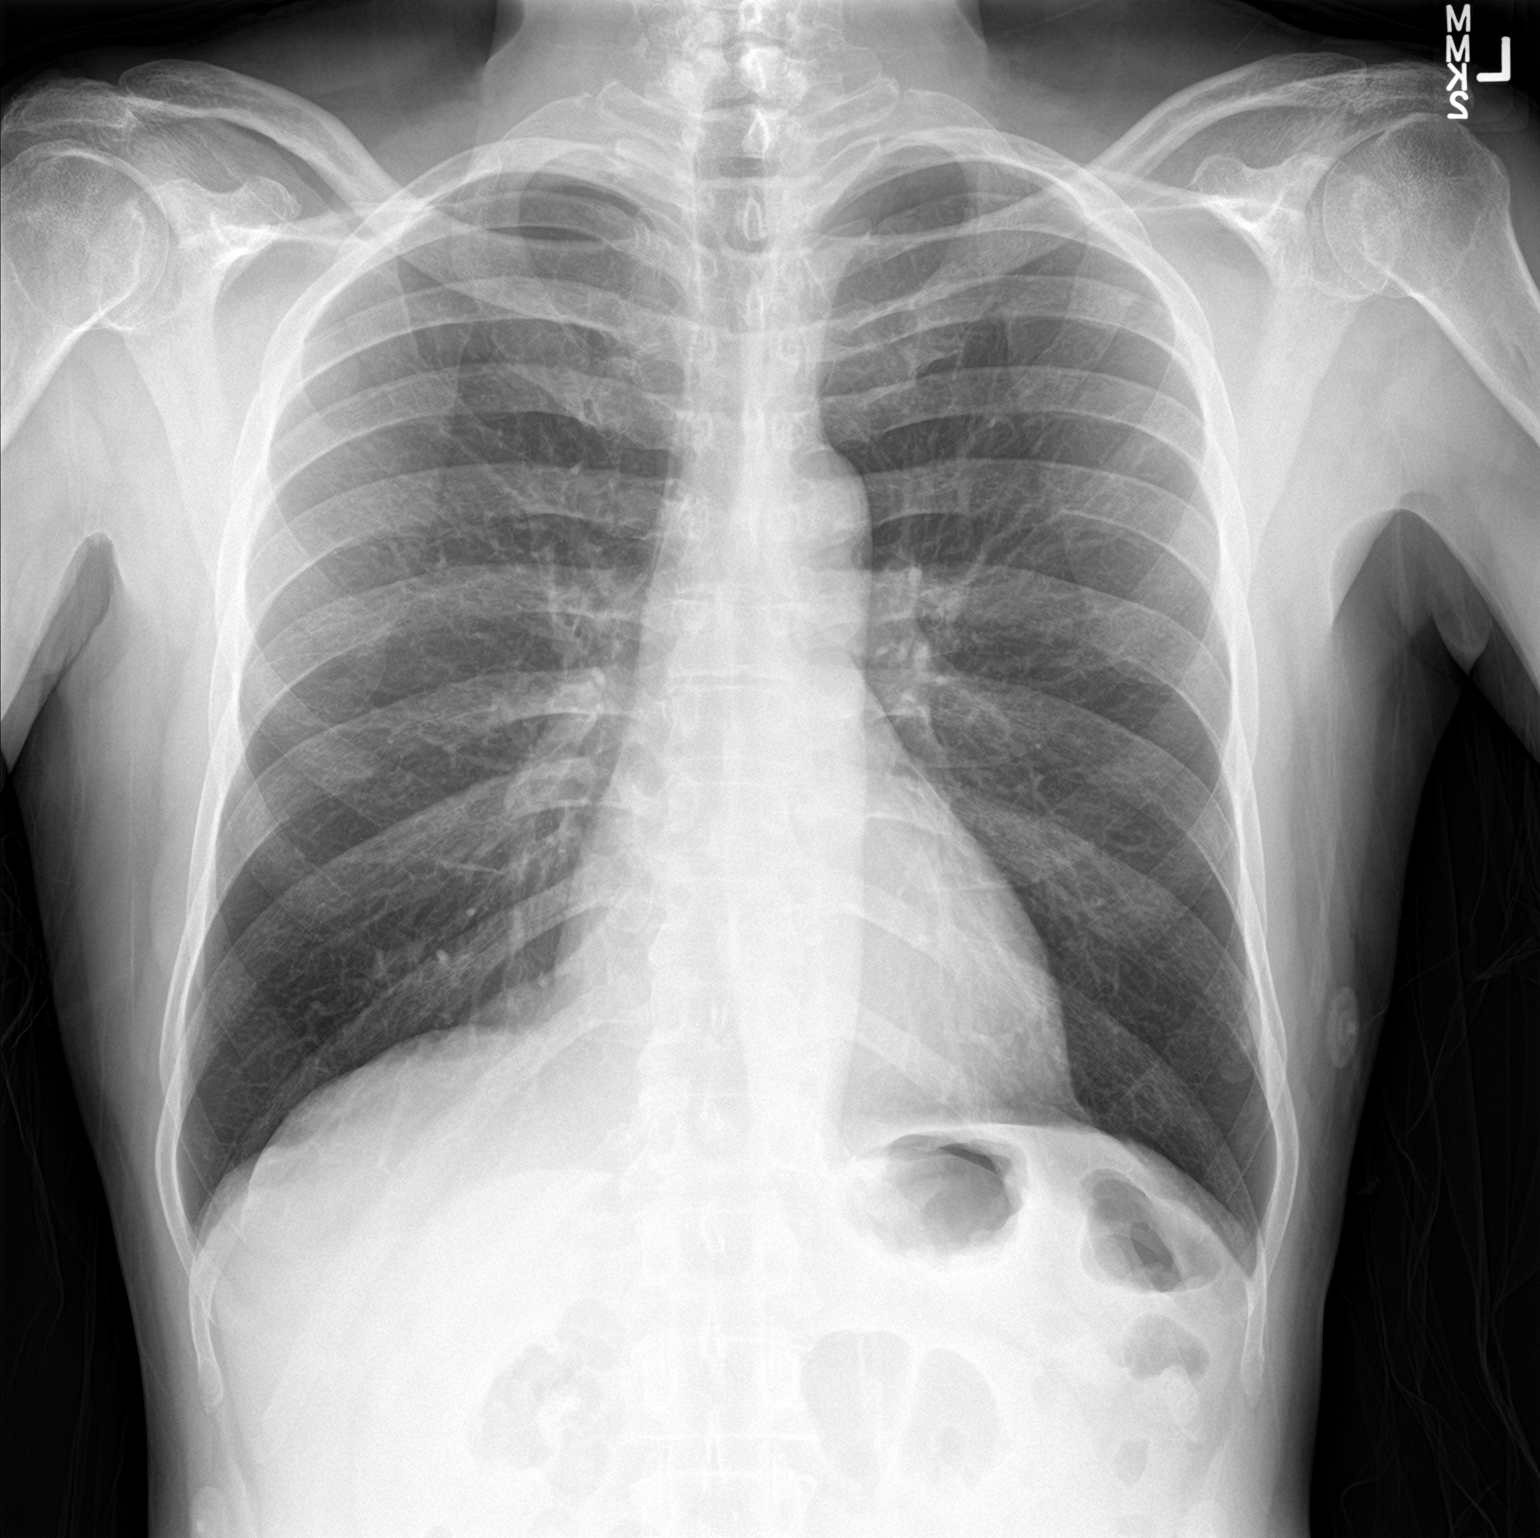

[chest lat]
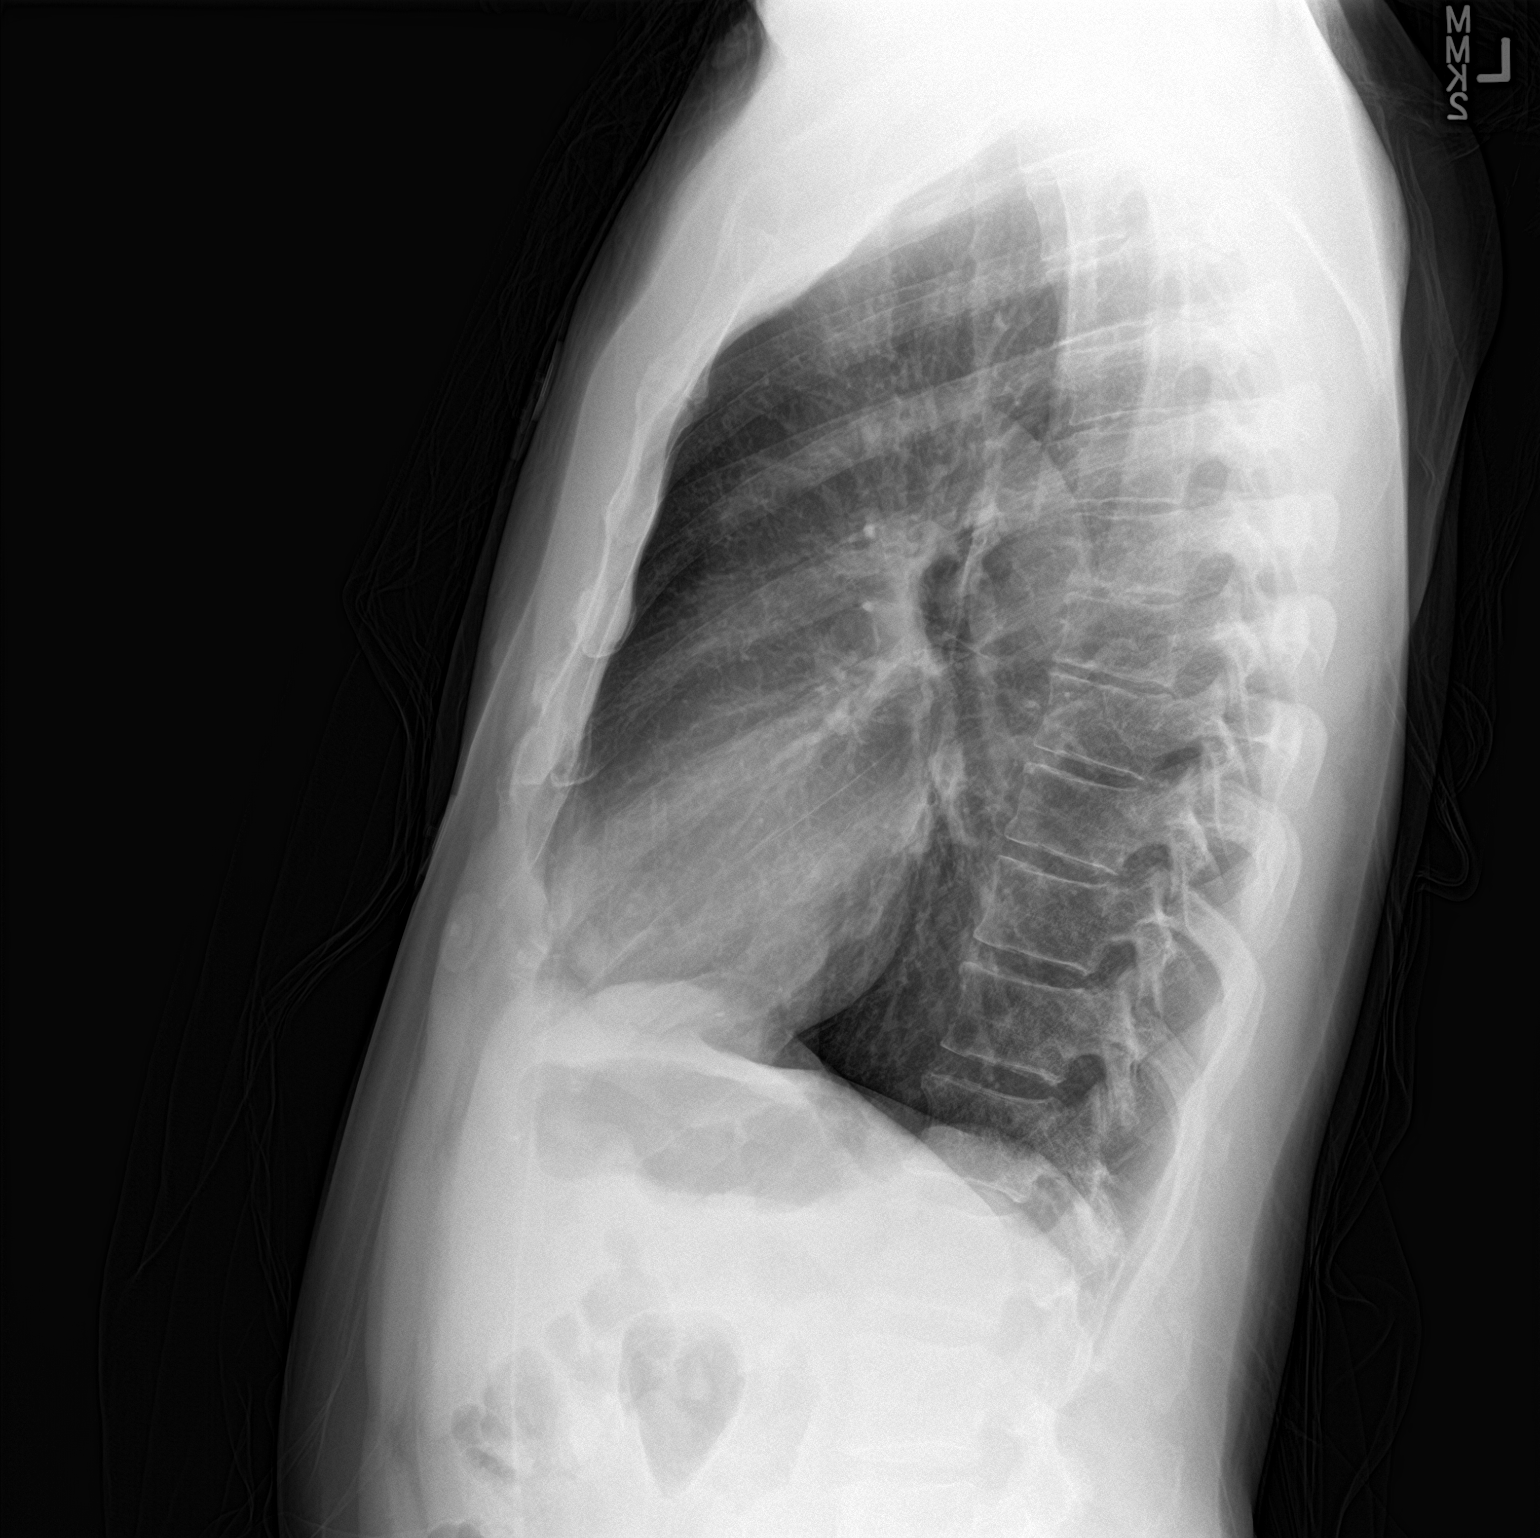

[2 of 2 positions shown; findings below may reference images not displayed]

FINDINGS: Normal sized heart. Clear lungs with normal vascularity.
Unremarkable bones.
IMPRESSION: Normal examination.

## 2019-05-29 ENCOUNTER — Other Ambulatory Visit: Payer: Self-pay | Admitting: Cardiovascular Disease

## 2019-06-02 ENCOUNTER — Other Ambulatory Visit: Payer: Self-pay | Admitting: Cardiovascular Disease

## 2019-06-04 ENCOUNTER — Other Ambulatory Visit: Payer: Self-pay | Admitting: Cardiovascular Disease

## 2019-06-26 ENCOUNTER — Other Ambulatory Visit: Payer: Self-pay | Admitting: Cardiovascular Disease

## 2019-07-10 ENCOUNTER — Other Ambulatory Visit: Payer: Self-pay | Admitting: Cardiovascular Disease

## 2019-10-14 ENCOUNTER — Other Ambulatory Visit: Payer: Self-pay | Admitting: Cardiovascular Disease

## 2019-10-17 ENCOUNTER — Other Ambulatory Visit: Payer: Self-pay | Admitting: Cardiovascular Disease

## 2019-10-24 ENCOUNTER — Other Ambulatory Visit: Payer: Self-pay | Admitting: Cardiovascular Disease

## 2019-10-24 ENCOUNTER — Other Ambulatory Visit: Payer: Self-pay | Admitting: *Deleted

## 2019-10-24 MED ORDER — HYDROCHLOROTHIAZIDE 25 MG PO TABS: ORAL_TABLET | ORAL | 0 refills | Status: DC

## 2019-11-22 ENCOUNTER — Other Ambulatory Visit: Payer: Self-pay | Admitting: Cardiovascular Disease

## 2019-11-30 ENCOUNTER — Other Ambulatory Visit: Payer: Self-pay | Admitting: Cardiovascular Disease

## 2019-12-12 ENCOUNTER — Encounter: Payer: Self-pay | Admitting: Nurse Practitioner

## 2019-12-12 ENCOUNTER — Ambulatory Visit: Payer: Medicare (Managed Care) | Attending: Nurse Practitioner | Admitting: Nurse Practitioner

## 2019-12-12 VITALS — Ht 70.0 in | Wt 160.0 lb

## 2019-12-12 DIAGNOSIS — Z131 Encounter for screening for diabetes mellitus: Secondary | ICD-10-CM

## 2019-12-12 DIAGNOSIS — Z130 Encounter for screening for diseases of the blood and blood-forming organs and certain disorders involving the immune mechanism: Secondary | ICD-10-CM

## 2019-12-12 DIAGNOSIS — I10 Essential (primary) hypertension: Secondary | ICD-10-CM

## 2019-12-12 DIAGNOSIS — Z125 Encounter for screening for malignant neoplasm of prostate: Secondary | ICD-10-CM

## 2019-12-12 DIAGNOSIS — N529 Male erectile dysfunction, unspecified: Secondary | ICD-10-CM

## 2019-12-12 DIAGNOSIS — I2510 Atherosclerotic heart disease of native coronary artery without angina pectoris: Secondary | ICD-10-CM | POA: Diagnosis not present

## 2019-12-12 DIAGNOSIS — E782 Mixed hyperlipidemia: Secondary | ICD-10-CM

## 2019-12-12 MED ORDER — HYDROCHLOROTHIAZIDE 25 MG PO TABS: 25.0000 mg | ORAL_TABLET | Freq: Every day | ORAL | 0 refills | Status: DC

## 2019-12-12 MED ORDER — SPIRONOLACTONE 25 MG PO TABS: 25.0000 mg | ORAL_TABLET | Freq: Every day | ORAL | 0 refills | Status: DC

## 2019-12-12 MED ORDER — ROSUVASTATIN CALCIUM 10 MG PO TABS: 10.0000 mg | ORAL_TABLET | ORAL | 3 refills | Status: DC

## 2019-12-12 MED ORDER — AMLODIPINE BESYLATE 10 MG PO TABS: 10.0000 mg | ORAL_TABLET | Freq: Every day | ORAL | 0 refills | Status: DC

## 2019-12-12 MED ORDER — SILDENAFIL CITRATE 100 MG PO TABS: ORAL_TABLET | ORAL | 5 refills | Status: DC

## 2019-12-12 MED ORDER — NITROGLYCERIN 0.4 MG SL SUBL: SUBLINGUAL_TABLET | SUBLINGUAL | 3 refills | Status: AC

## 2019-12-12 MED ORDER — CARVEDILOL 6.25 MG PO TABS: 6.2500 mg | ORAL_TABLET | Freq: Two times a day (BID) | ORAL | 0 refills | Status: DC

## 2019-12-12 NOTE — Progress Notes (Signed)
Virtual Visit via Telephone Note Due to national recommendations of social distancing due to [place] 19, telehealth visit is felt to be most appropriate for this patient at this time.  I discussed the limitations, risks, security and privacy concerns of performing an evaluation and management service by telephone and the availability of in person appointments. I also discussed with the patient that there may be a patient responsible charge related to this service. The patient expressed understanding and agreed to proceed.    I connected with Estrellita Ludwig on 12/12/19  at   1:50 PM EDT  EDT by telephone and verified that I am speaking with the correct person using two identifiers.   Consent I discussed the limitations, risks, security and privacy concerns of performing an evaluation and management service by telephone and the availability of in person appointments. I also discussed with the patient that there may be a patient responsible charge related to this service. The patient expressed understanding and agreed to proceed.   Location of Patient: Private  Residence   Location of Provider: [place] [facility] [employer] Office    Persons participating in Telemedicine visit: Geryl Rankins FNP-BC [place]    History of Present Illness: Telemedicine visit for: Establish Care  He has a history of coronary artery disease status post PTCA and stenting of the right coronary artery in 2008 and per cardiology also has moderate disease involving the LAD. Also history of hypertension and dyslipidemia. Was previously seeing Dr. Acie Fredrickson with last office visit 05/01/2018. I have instructed him that he will need to follow back up with Dr. Sherron Monday and have also placed a referral back to their office.   Essential Hypertension Monitors blood pressure at home. Reading at home yesterday 152/87. Average SBP 140s. He does not take his antihypertensives every day as prescribed. Currently  taking amlodipine 5 mg daily which I will increase to 10 mg today, carvedilol 6.25 mg twice daily, hydrochlorothiazide 25 mg daily and spironolactone 25 mg daily. Denies chest pain, shortness of breath, palpitations, lightheadedness, dizziness, headaches or BLE edema. He does continue to smoke. BP Readings from Last 3 Encounters:  05/27/18 (!) 160/85  04/26/18 (!) 144/90  04/12/18 (!) 182/102   MIXED HYPERLIPIDEMIA He has been out of his Crestor 10 mg which he takes every other day. Will refill today. He denies any history of statin intolerance. LDL is not at goal of less than 100. He is not dietary adherent at this time. Lab Results  Component Value Date   LDLCALC 145 (H) 07/19/2018    Past Medical History:  Diagnosis Date  . Chest pain   . Coronary artery disease    Status post PTCA and stenting of his right coronary artery in March, 2008. He also has a 40-50% LAD stenosis  . Hypertension   . LAD stenosis    MODERATE BETWEEN 50-60%  . 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 CARDIAC CATHETERIZATION  08/23/2006   EF 65%. THERE IS INFEROBASILAR AKINESIS  . CARDIOVASCULAR STRESS TEST  04/21/2010   EF 51%. NO SIGNIFICANT ST SEGMENT CHANGE SUGGESTIVE OF ISCHEMIA  . CORONARY ANGIOPLASTY WITH STENT PLACEMENT     STENTING OF HIS RCA    Family History  Problem Relation Age of Onset  . Hypertension Mother   . Hypertension Brother   . Hypertension Daughter     Social History   Socioeconomic History  . Marital status:  Married    Spouse name: Not on file  . Number of children: Not on file  . Years of education: Not on file  . Highest education level: Not on file  Occupational History  . Not on file  Tobacco Use  . Smoking status: Current Every Day Smoker    Packs/day: 1.00    Types: Cigarettes  . Smokeless tobacco: Never Used  Vaping Use  . Vaping Use: Never used  Substance and Sexual Activity  . Alcohol use: Yes    Comment:  ocassion  . Drug use: Yes    Types: Marijuana  . Sexual activity: Yes  Other Topics Concern  . Not on file  Social History Narrative  . Not on file   Social Determinants of Health   Financial Resource Strain:   . Difficulty of Paying Living Expenses:   Food Insecurity:   . Worried About Charity fundraiser in the Last Year:   . Arboriculturist in the Last Year:   Transportation Needs:   . Film/video editor (Medical):   Marland Kitchen Lack of Transportation (Non-Medical):   Physical Activity:   . Days of Exercise per Week:   . Minutes of Exercise per Session:   Stress:   . Feeling of Stress :   Social Connections:   . Frequency of Communication with Friends and Family:   . Frequency of Social Gatherings with Friends and Family:   . Attends Religious Services:   . Active Member of Clubs or Organizations:   . Attends Archivist Meetings:   Marland Kitchen Marital Status:      Observations/Objective: Awake, alert and oriented x 3   Review of Systems  Constitutional: Negative for fever, malaise/fatigue and weight loss.  HENT: Negative.  Negative for nosebleeds.   Eyes: Negative.  Negative for blurred vision, double vision and photophobia.  Respiratory: Negative.  Negative for cough and shortness of breath.   Cardiovascular: Negative.  Negative for chest pain, palpitations and leg swelling.  Gastrointestinal: Negative.  Negative for heartburn, nausea and vomiting.  Genitourinary:       ED  Musculoskeletal: Negative.  Negative for myalgias.  Neurological: Negative.  Negative for dizziness, focal weakness, seizures and headaches.  Psychiatric/Behavioral: Negative.  Negative for suicidal ideas.    Assessment and Plan: Kue was seen today for new patient (initial visit).  Diagnoses and all orders for this visit:  Essential hypertension -     spironolactone (ALDACTONE) 25 MG tablet; Take 1 tablet (25 mg total) by mouth daily. PLEASE MAKE ANNUAL APPT FOR FUTURE REFILLS.  [phone] -     hydrochlorothiazide (HYDRODIURIL) 25 MG tablet; Take 1 tablet (25 mg total) by mouth daily. TAKE 1 TABLET BY MOUTH DAILY. -     carvedilol (COREG) 6.25 MG tablet; Take 1 tablet (6.25 mg total) by mouth 2 (two) times daily. -     CMP14+EGFR -     amLODipine (NORVASC) 10 MG tablet; Take 1 tablet (10 mg total) by mouth daily. Please schedule appt for future refills. 1st attempt Continue all antihypertensives as prescribed.  Remember to bring in your blood pressure log with you for your follow up appointment.  DASH/Mediterranean Diets are healthier choices for HTN.    Mixed hyperlipidemia -     rosuvastatin (CRESTOR) 10 MG tablet; Take 1 tablet (10 mg total) by mouth every other day. -     Lipid panel -     Hemoglobin A1c INSTRUCTIONS: Work on a low fat, heart  healthy diet and participate in regular aerobic exercise program by working out at least 150 minutes per week; 5 days a week-30 minutes per day. Avoid red meat/beef/steak,  fried foods. junk foods, sodas, sugary drinks, unhealthy snacking, alcohol and smoking.  Drink at least 80 oz of water per day and monitor your carbohydrate intake daily.    Coronary artery disease involving native coronary artery of native heart without angina pectoris -     Ambulatory referral to Cardiology -     spironolactone (ALDACTONE) 25 MG tablet; Take 1 tablet (25 mg total) by mouth daily. PLEASE MAKE ANNUAL APPT FOR FUTURE REFILLS. [phone] -     rosuvastatin (CRESTOR) 10 MG tablet; Take 1 tablet (10 mg total) by mouth every other day. -     nitroGLYCERIN (NITROSTAT) 0.4 MG SL tablet; PLACE 1 TABLET UNDER THE TOUNGE EVERY 5MINS AS NEEDED FOR CHEST PAIN -     Lipid panel HE IS AWARE TO NOT TAKE NTG with VIAGRA  Prostate cancer screening -     PSA  Encounter for screening for diabetes mellitus -     Hemoglobin A1c  Screening for deficiency anemia -     CBC  Erectile dysfunction, unspecified erectile dysfunction type -      sildenafil (VIAGRA) 100 MG tablet; TAKE 1 TABLET BY MOUTH DAILY AS NEEDED FOR ERECTILE DYSFUNCTION     Follow Up Instructions Return in about 4 weeks (around 01/09/2020) for HTN.     I discussed the assessment and treatment plan with the patient. The patient was provided an opportunity to ask questions and all were answered. The patient agreed with the plan and demonstrated an understanding of the instructions.   The patient was advised to call back or seek an in-person evaluation if the symptoms worsen or if the condition fails to improve as anticipated.  I provided 19 minutes of non-face-to-face time during this encounter including median intraservice time, reviewing previous notes, labs, imaging, medications and explaining diagnosis and management.  Gildardo Pounds, FNP-BC

## 2019-12-19 ENCOUNTER — Other Ambulatory Visit: Payer: Medicare (Managed Care)

## 2019-12-19 ENCOUNTER — Other Ambulatory Visit: Payer: Self-pay

## 2019-12-19 ENCOUNTER — Ambulatory Visit: Payer: Medicare (Managed Care) | Attending: Nurse Practitioner

## 2019-12-20 LAB — CBC
Hematocrit: 48.3 % (ref 37.5–51.0)
Hemoglobin: 16.7 g/dL (ref 13.0–17.7)
MCH: 32.9 pg (ref 26.6–33.0)
MCHC: 34.6 g/dL (ref 31.5–35.7)
MCV: 95 fL (ref 79–97)
Platelets: 256 10*3/uL (ref 150–450)
RBC: 5.08 x10E6/uL (ref 4.14–5.80)
RDW: 13.3 % (ref 11.6–15.4)
WBC: 5.8 10*3/uL (ref 3.4–10.8)

## 2019-12-20 LAB — CMP14+EGFR
ALT: 24 IU/L (ref 0–44)
AST: 28 IU/L (ref 0–40)
Albumin/Globulin Ratio: 2 (ref 1.2–2.2)
Albumin: 4.8 g/dL (ref 3.8–4.8)
Alkaline Phosphatase: 88 IU/L (ref 48–121)
BUN/Creatinine Ratio: 13 (ref 10–24)
BUN: 15 mg/dL (ref 8–27)
Bilirubin Total: 0.3 mg/dL (ref 0.0–1.2)
CO2: 25 mmol/L (ref 20–29)
Calcium: 9.8 mg/dL (ref 8.6–10.2)
Chloride: 97 mmol/L (ref 96–106)
Creatinine, Ser: 1.13 mg/dL (ref 0.76–1.27)
GFR calc Af Amer: 77 mL/min/{1.73_m2} (ref >59)
GFR calc non Af Amer: 67 mL/min/{1.73_m2} (ref >59)
Globulin, Total: 2.4 g/dL (ref 1.5–4.5)
Glucose: 106 mg/dL — ABNORMAL HIGH (ref 65–99)
Potassium: 3.9 mmol/L (ref 3.5–5.2)
Sodium: 137 mmol/L (ref 134–144)
Total Protein: 7.2 g/dL (ref 6.0–8.5)

## 2019-12-20 LAB — LIPID PANEL
Chol/HDL Ratio: 4.4 ratio (ref 0.0–5.0)
Cholesterol, Total: 199 mg/dL (ref 100–199)
HDL: 45 mg/dL (ref >39)
LDL Chol Calc (NIH): 122 mg/dL — ABNORMAL HIGH (ref 0–99)
Triglycerides: 180 mg/dL — ABNORMAL HIGH (ref 0–149)
VLDL Cholesterol Cal: 32 mg/dL (ref 5–40)

## 2019-12-20 LAB — HEMOGLOBIN A1C
Est. average glucose Bld gHb Est-mCnc: 126 mg/dL
Hgb A1c MFr Bld: 6 % — ABNORMAL HIGH (ref 4.8–5.6)

## 2019-12-20 LAB — PSA: Prostate Specific Ag, Serum: 0.7 ng/mL (ref 0.0–4.0)

## 2020-01-09 ENCOUNTER — Other Ambulatory Visit: Payer: Self-pay

## 2020-01-09 ENCOUNTER — Ambulatory Visit: Payer: Medicare (Managed Care) | Attending: Nurse Practitioner | Admitting: Nurse Practitioner

## 2020-01-09 ENCOUNTER — Encounter: Payer: Self-pay | Admitting: Nurse Practitioner

## 2020-01-09 VITALS — BP 166/93 | HR 70 | Temp 97.7°F | Ht 69.5 in | Wt 149.0 lb

## 2020-01-09 DIAGNOSIS — E785 Hyperlipidemia, unspecified: Secondary | ICD-10-CM

## 2020-01-09 DIAGNOSIS — I10 Essential (primary) hypertension: Secondary | ICD-10-CM | POA: Diagnosis not present

## 2020-01-09 DIAGNOSIS — E782 Mixed hyperlipidemia: Secondary | ICD-10-CM

## 2020-01-09 DIAGNOSIS — R7303 Prediabetes: Secondary | ICD-10-CM | POA: Diagnosis not present

## 2020-01-09 DIAGNOSIS — Z1211 Encounter for screening for malignant neoplasm of colon: Secondary | ICD-10-CM

## 2020-01-09 LAB — GLUCOSE, POCT (MANUAL RESULT ENTRY): POC Glucose: 112 mg/dl — AB (ref 70–99)

## 2020-01-09 MED ORDER — ATORVASTATIN CALCIUM 40 MG PO TABS: 40.0000 mg | ORAL_TABLET | Freq: Every day | ORAL | 3 refills | Status: DC

## 2020-01-09 MED ORDER — CHLORTHALIDONE 25 MG PO TABS: 25.0000 mg | ORAL_TABLET | Freq: Every day | ORAL | 1 refills | Status: DC

## 2020-01-09 NOTE — Progress Notes (Signed)
Assessment & Plan:  Javier Rios was seen today for blood pressure check.  Diagnoses and all orders for this visit:  Essential hypertension -     chlorthalidone (HYGROTON) 25 MG tablet; Take 1 tablet (25 mg total) by mouth daily. Continue all antihypertensives as prescribed.  Remember to bring in your blood pressure log with you for your follow up appointment.  DASH/Mediterranean Diets are healthier choices for HTN.   Colon cancer screening -     Fecal occult blood, imunochemical([facility]/Sun[facility])  Prediabetes -     Glucose (CBG)  Mixed hyperlipidemia -     atorvastatin (LIPITOR) 40 MG tablet; Take 1 tablet (40 mg total) by mouth at bedtime. INSTRUCTIONS: Work on a low fat, heart healthy diet and participate in regular aerobic exercise program by working out at least 150 minutes per week; 5 days a week-30 minutes per day. Avoid red meat/beef/steak,  fried foods. junk foods, sodas, sugary drinks, unhealthy snacking, alcohol and smoking.  Drink at least 80 oz of water per day and monitor your carbohydrate intake daily.    Patient has been counseled on age-appropriate routine health concerns for screening and prevention. These are reviewed and up-to-date. Referrals have been placed accordingly. Immunizations are up-to-date or declined.    Subjective:   Chief Complaint  Patient presents with  . Blood Pressure Check    Pt. is here for blood pressure check.    HPI Hansen Carino 69 y.o. male presents to office today for follow up to HTN. He has questions regarding receiving the COVID vaccine. I have instructed him that it is recommended he receive the COVID vaccine.     Essential Hypertension Blood pressure at home 140/80-90s. He has taken benicar in the past which he states caused erectile dysfunction. He monitors his blood pressure at home about every 2-3 days. I will switch HCTZ 25 mg to chlorthalidone 25 mg. He does not take his blood pressure medications daily as prescribed. Other  medications include spironolactone 25 mg daily, amlodipine 10 mg daily, carvedilol 6.25 mg BID. Denies chest pain, shortness of breath, palpitations, lightheadedness, dizziness, headaches or BLE edema. He is trying to quit smoking. BP Readings from Last 3 Encounters:  01/09/20 (!) 166/93  05/27/18 (!) 160/85  04/26/18 (!) 144/90    Mixed Hyperlipidemia He stopped taking crestor due to muscle cramps in his legs. ASCVD score is 42.6%. Will try atorvastatin 40 mg daily.  The 10-year ASCVD risk score Denman George DC Montez Hageman., et al., 2013) is: 42.6%   Values used to calculate the score:     Age: 69 years     Sex: Male     Is Non-Hispanic African American: Yes     Diabetic: No     Tobacco smoker: Yes     Systolic Blood Pressure: 166 mmHg     Is BP treated: Yes     HDL Cholesterol: 45 mg/dL     Total Cholesterol: 199 mg/dL    Review of Systems  Constitutional: Negative for fever, malaise/fatigue and weight loss.  HENT: Negative.  Negative for nosebleeds.   Eyes: Negative.  Negative for blurred vision, double vision and photophobia.  Respiratory: Negative.  Negative for cough and shortness of breath.   Cardiovascular: Negative.  Negative for chest pain, palpitations and leg swelling.  Gastrointestinal: Negative.  Negative for heartburn, nausea and vomiting.  Musculoskeletal: Negative.  Negative for myalgias.  Neurological: Negative.  Negative for dizziness, focal weakness, seizures and headaches.  Psychiatric/Behavioral: Negative.  Negative for suicidal ideas.  Past Medical History:  Diagnosis Date  . Chest pain   . Coronary artery disease    Status post PTCA and stenting of his right coronary artery in March, 2008. He also has a 40-50% LAD stenosis  . Hypertension   . LAD stenosis    MODERATE BETWEEN 50-60%  . 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 CARDIAC CATHETERIZATION  08/23/2006   EF 65%. THERE IS INFEROBASILAR AKINESIS  .  CARDIOVASCULAR STRESS TEST  04/21/2010   EF 51%. NO SIGNIFICANT ST SEGMENT CHANGE SUGGESTIVE OF ISCHEMIA  . CORONARY ANGIOPLASTY WITH STENT PLACEMENT     STENTING OF HIS RCA    Family History  Problem Relation Age of Onset  . Hypertension Mother   . Hypertension Brother   . Hypertension Daughter     Social History Reviewed with no changes to be made today.   Outpatient Medications Prior to Visit  Medication Sig Dispense Refill  . albuterol (PROAIR HFA) 108 (90 Base) MCG/ACT inhaler INHALE 2 PUFFS INTO THE LUNGS EVERY 6 (SIX) HOURS AS NEEDED FOR WHEEZING OR SHORTNESS OF BREATH. 1 Inhaler 3  . amLODipine (NORVASC) 10 MG tablet Take 1 tablet (10 mg total) by mouth daily. Please schedule appt for future refills. 1st attempt 30 tablet 0  . aspirin 81 MG tablet Take 81 mg by mouth daily.      . carvedilol (COREG) 6.25 MG tablet Take 1 tablet (6.25 mg total) by mouth 2 (two) times daily. 60 tablet 0  . nitroGLYCERIN (NITROSTAT) 0.4 MG SL tablet PLACE 1 TABLET UNDER THE TOUNGE EVERY AS NEEDED FOR CHEST PAIN 25 tablet 3  . sildenafil (VIAGRA) 100 MG tablet TAKE 1 TABLET BY MOUTH DAILY AS NEEDED FOR ERECTILE DYSFUNCTION 10 tablet 5  . spironolactone (ALDACTONE) 25 MG tablet Take 1 tablet (25 mg total) by mouth daily. PLEASE MAKE ANNUAL APPT FOR FUTURE REFILLS. [phone] 90 tablet 0  . hydrochlorothiazide (HYDRODIURIL) 25 MG tablet Take 1 tablet (25 mg total) by mouth daily. TAKE 1 TABLET BY MOUTH DAILY. 90 tablet 0  . rosuvastatin (CRESTOR) 10 MG tablet Take 1 tablet (10 mg total) by mouth every other day. (Patient not taking: Reported on 01/09/2020) 45 tablet 3   No facility-administered medications prior to visit.    No Known Allergies     Objective:    BP (!) 166/93 (BP Location: Left Arm, Patient Position: Sitting, Cuff Size: Normal)   Pulse 70   Temp 97.7 F (36.5 C) (Temporal)   Ht 5' 9.5" (1.765 m)   Wt 149 lb (67.6 kg)   SpO2 98%   BMI 21.69 kg/m  Wt Readings from  Last 3 Encounters:  01/09/20 149 lb (67.6 kg)  12/12/19 160 lb (72.6 kg)  05/27/18 155 lb (70.3 kg)    Physical Exam Vitals and nursing note reviewed.  Constitutional:      Appearance: He is well-developed.  HENT:     Head: Normocephalic and atraumatic.  Cardiovascular:     Rate and Rhythm: Normal rate and regular rhythm.     Heart sounds: Normal heart sounds. No murmur heard.  No friction rub. No gallop.   Pulmonary:     Effort: Pulmonary effort is normal. No tachypnea or respiratory distress.     Breath sounds: Normal breath sounds. No decreased breath sounds, wheezing, rhonchi or rales.  Chest:     Chest wall: No tenderness.  Abdominal:  General: Bowel sounds are normal.     Palpations: Abdomen is soft.  Musculoskeletal:        General: Normal range of motion.     Cervical back: Normal range of motion.  Skin:    General: Skin is warm and dry.  Neurological:     Mental Status: He is alert and oriented to person, place, and time.     Coordination: Coordination normal.  Psychiatric:        Behavior: Behavior normal. Behavior is cooperative.        Thought Content: Thought content normal.        Judgment: Judgment normal.          Patient has been counseled extensively about nutrition and exercise as well as the importance of adherence with medications and regular follow-up. The patient was given clear instructions to go to ER or return to medical center if symptoms don't improve, worsen or new problems develop. The patient verbalized understanding.   Follow-up: Return in about 3 weeks (around 01/30/2020) for BP recheck with LUKE.   Claiborne Rigg, FNP-BC [facility] [place], [place] [phone]   01/11/2020, 12:08 AM

## 2020-01-11 ENCOUNTER — Other Ambulatory Visit: Payer: Self-pay | Admitting: Nurse Practitioner

## 2020-01-11 ENCOUNTER — Encounter: Payer: Self-pay | Admitting: Nurse Practitioner

## 2020-01-11 DIAGNOSIS — I10 Essential (primary) hypertension: Secondary | ICD-10-CM

## 2020-01-11 NOTE — Telephone Encounter (Signed)
Requested medication (s) are due for refill today: yes  Requested medication (s) are on the active medication list: med expired 01/11/20 Last refill:  12/12/19  Future visit scheduled: no  Notes to clinic:  pt has OV 2 days ago- Med expired   Requested Prescriptions  Pending Prescriptions Disp Refills   amLODipine (NORVASC) 10 MG tablet [Pharmacy Med Name: AMLODIPINE BESYLATE 10 MG TAB] 30 tablet 0    Sig: Take 1 tablet (10 mg total) by mouth daily. Please schedule appt for future refills. 1st attempt      Cardiovascular:  Calcium Channel Blockers Failed - 01/11/2020 11:32 AM      Failed - Last BP in normal range    BP Readings from Last 1 Encounters:  01/09/20 (!) 166/93          Passed - Valid encounter within last 6 months    Recent Outpatient Visits           2 days ago Essential hypertension   [place] [facility] [place], Shea Stakes, NP   1 month ago Essential hypertension   [place] [facility] [place], Shea Stakes, NP       Future Appointments             In 1 week Nahser, Deloris Ping, MD [facility] Office, [facility]   In 2 weeks Drucilla Chalet, RPH-CPP [facility]

## 2020-01-11 NOTE — Telephone Encounter (Signed)
Requested Prescriptions  Pending Prescriptions Disp Refills  . carvedilol (COREG) 6.25 MG tablet [Pharmacy Med Name: CARVEDILOL 6.25 MG TABLET] 180 tablet 1    Sig: TAKE 1 TABLET BY MOUTH TWICE A DAY     Cardiovascular:  Beta Blockers Failed - 01/11/2020 12:30 PM      Failed - Last BP in normal range    BP Readings from Last 1 Encounters:  01/09/20 (!) 166/93         Passed - Last Heart Rate in normal range    Pulse Readings from Last 1 Encounters:  01/09/20 70         Passed - Valid encounter within last 6 months    Recent Outpatient Visits          2 days ago Essential hypertension   [place] [facility] [place], Shea Stakes, NP   1 month ago Essential hypertension   [place] [facility] [place], Shea Stakes, NP      Future Appointments            In 1 week Nahser, Deloris Ping, MD [facility] Office, [facility]   In 2 weeks Drucilla Chalet, RPH-CPP [facility]

## 2020-01-23 ENCOUNTER — Other Ambulatory Visit: Payer: Self-pay

## 2020-01-23 ENCOUNTER — Ambulatory Visit (INDEPENDENT_AMBULATORY_CARE_PROVIDER_SITE_OTHER): Payer: Medicare (Managed Care) | Admitting: Cardiovascular Disease

## 2020-01-23 ENCOUNTER — Other Ambulatory Visit: Payer: Self-pay | Admitting: Family Medicine

## 2020-01-23 ENCOUNTER — Encounter: Payer: Self-pay | Admitting: Cardiovascular Disease

## 2020-01-23 VITALS — BP 130/86 | HR 76 | Ht 69.5 in | Wt 149.0 lb

## 2020-01-23 DIAGNOSIS — I2510 Atherosclerotic heart disease of native coronary artery without angina pectoris: Secondary | ICD-10-CM | POA: Diagnosis not present

## 2020-01-23 DIAGNOSIS — E782 Mixed hyperlipidemia: Secondary | ICD-10-CM | POA: Diagnosis not present

## 2020-01-23 DIAGNOSIS — I10 Essential (primary) hypertension: Secondary | ICD-10-CM

## 2020-01-23 NOTE — Patient Instructions (Signed)
Medication Instructions:  Your provider recommends that you continue on your current medications as directed. Please refer to the Current Medication list given to you today.   *If you need a refill on your cardiac medications before your next appointment, please call your pharmacy*  Follow-Up: You have been referred to LIPID CLINIC.   At [facility], you and your health needs are our priority.  As part of our continuing mission to provide you with exceptional heart care, we have created designated Provider Care Teams.  These Care Teams include your primary Cardiologist (physician) and Advanced Practice Providers (APPs -  Physician Assistants and Nurse Practitioners) who all work together to provide you with the care you need, when you need it. Your next appointment:   6 month(s) The format for your next appointment:   In Person Provider:   Tereso Newcomer, PA-C

## 2020-01-23 NOTE — Progress Notes (Signed)
Cardiology Office Note   Date:  01/23/2020   ID:  Javier Rios, DOB 1950/09/20, MRN [id number]  PCP:  Claiborne Rigg, NP  Cardiologist:   Kristeen Miss, MD   Chief Complaint  Patient presents with  . Coronary Artery Disease  . Hypertension   1. History of coronary artery disease-status post PTCA and stenting of his right coronary artery-08/23/2006 he has moderate disease involving his LAD. 2. Hypertension 3. Hyperlipidemia   69 year old gentleman with a history of coronary artery disease. He is status post PTCA and stenting of his right coronary artery. He has a moderate stenosis in his LAD.  His last stress Myoview study was November, 2011. There is no evidence of anterior wall ischemia. He has a mild defect in his inferior wall which corresponds to his previous inferior basilar myocardial infarction.  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 He is not having any chest pain.  He has trouble with getting his meds. He is eating lots of bologna because it is inexpensive. His power to his apartment is off and he is unable to eat regular food. He has been going to [employer] and eating chicken biscuits.   Feb. 6, 2014:  He has stopped taking all medications. He states he is not able to afford them. 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 He ran out of his meds. He is still eating lots of salty foods ( bologna, fast foods)  January 20, 2013:  Javier Rios is staying active. 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 Has not been taking his HCTZ or zocor due to cost. He is still eating fast foods - although he is trying to cut back.   September 05, 2013:  Javier Rios is still doing well. Still working 2 jobs - he has been having some indigestion - started taking gaviscon which helps. He has had some dyspnea . He has not wanted to have a stress test due to cost. His insurance does not cover much.  He walks at work and typically is able to get to his work day without too much trouble.  Nov. 10, 2015:  Javier Rios is seen today for followup of his coronary artery disease and hypertension. His blood pressure is a little elevated today. He stayed up last night and watched the football game. He also ate some fried chicken which probably had some extra salt in it. His BP has been elevated for the past several office visits.    April 29 , 2016:  Javier Rios is a 69 y.o. male who presents for follow-up of his coronary artery disease. BP is  Elevated this am .  He has been working all night.    He brought his BP log.  His readings are all ok. His blood pressure is typically in the 120/75 range. His heart rate is typically in the 70-80 range.   Nov. 9, 2016:   BP is up today . Has a cold and has been taking some cold meds May have eaten a bit of extra salt recently .   May 17 ,2017:  Doing well. Went to the ER with the   Nov. 28, 2017:  Javier Rios is seen today for follow-up of his coronary artery disease, hypertension and congestive heart failure. BP is elevated  Still eating lots of salt  Still smoking  Has requested an inhaler   Feb. 27,  2018:  Doing ok Is taking his meds.   BP is well controlled   03/09/2017: Has not been working . Made him pay his money back. He started taking SS at age 56 but he made too much   Has stopped some / most of his meds.  Unsure exactly what he is taking .   Still smoking.   I advised him that he could afford his meds if he quit smoking .  He is still taking atorvastatin ,  HCTZ,  Is not taking Kdur or aldactone  Is having some leg cramps since running out of the kdur .   Oct 12, 2017: Javier Rios seen today.  As usual, he is run out of some of his medications.  His blood pressure remains elevated.  He still smoking. Just got his medicaid card .   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 1,  2019: Javier Rios is doing well. BP is a bit high Has been eating more salt recently  Still smoking . Has cut back some   January 23, 2020:  Javier Rios is seen today for follow-up visit.  He has a history of coronary artery disease, hyperlipidemia, hypertension. No CP  Was having lots of muscle aches with atorvastatin  Also has had an intolerance to rosuvastatin. Still smoking  -    Past Medical History:  Diagnosis Date  . Chest pain   . Coronary artery disease    Status post PTCA and stenting of his right coronary artery in March, 2008. He also has a 40-50% LAD stenosis  . Hypertension   . LAD stenosis    MODERATE BETWEEN 50-60%  . 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 CARDIAC CATHETERIZATION  08/23/2006   EF 65%. THERE IS INFEROBASILAR AKINESIS  . CARDIOVASCULAR STRESS TEST  04/21/2010   EF 51%. NO SIGNIFICANT ST SEGMENT CHANGE SUGGESTIVE OF ISCHEMIA  . CORONARY ANGIOPLASTY WITH STENT PLACEMENT     STENTING OF HIS RCA     Current Outpatient Medications  Medication Sig Dispense Refill  . albuterol (PROAIR HFA) 108 (90 Base) MCG/ACT inhaler INHALE 2 PUFFS INTO THE LUNGS EVERY 6 (SIX) HOURS AS NEEDED FOR WHEEZING OR SHORTNESS OF BREATH. 1 Inhaler 3  . amLODipine (NORVASC) 10 MG tablet Take 1 tablet (10 mg total) by mouth daily. 90 tablet 0  . aspirin 81 MG tablet Take 81 mg by mouth daily.      . carvedilol (COREG) 6.25 MG tablet TAKE 1 TABLET BY MOUTH TWICE A DAY 180 tablet 1  . chlorthalidone (HYGROTON) 25 MG tablet Take 1 tablet (25 mg total) by mouth daily. 30 tablet 1  . nitroGLYCERIN (NITROSTAT) 0.4 MG SL tablet PLACE 1 TABLET UNDER THE TOUNGE EVERY AS NEEDED FOR CHEST PAIN 25 tablet 3  . sildenafil (VIAGRA) 100 MG tablet TAKE 1 TABLET BY MOUTH DAILY AS NEEDED FOR ERECTILE DYSFUNCTION 10 tablet 5  . spironolactone (ALDACTONE) 25 MG tablet Take 1 tablet (25 mg total) by mouth daily. PLEASE MAKE ANNUAL APPT FOR FUTURE REFILLS.  [phone] 90 tablet 0   No current facility-administered medications for this visit.    Allergies:   Patient has no known allergies.    Social History:  The patient  reports that he has been smoking cigarettes. He has been smoking about 1.00 pack per day. He has never  used smokeless tobacco. He reports current alcohol use. He reports current drug use. Drug: Marijuana.   Family History:  The patient's family history includes Hypertension in his brother, daughter, and mother.    ROS:  Noted in current hx     Physical Exam: Blood pressure 130/86, pulse 76, height 5' 9.5" (1.765 m), weight 149 lb (67.6 kg), SpO2 98 %.  GEN:  Well nourished, well developed in no acute distress HEENT: Normal NECK: No JVD; No carotid bruits LYMPHATICS: No lymphadenopathy CARDIAC: RRR , no murmurs, rubs, gallops RESPIRATORY:  Clear to auscultation without rales, wheezing or rhonchi  ABDOMEN: Soft, non-tender, non-distended MUSCULOSKELETAL:  No edema; No deformity  SKIN: Warm and dry NEUROLOGIC:  Alert and oriented x 3    EKG:    Sinus rhythm at 76.  No ST or T wave changes.  Previous Inf. MI    Recent Labs: 12/19/2019: ALT 24; BUN 15; Creatinine, Ser 1.13; Hemoglobin 16.7; Platelets 256; Potassium 3.9; Sodium 137    Lipid Panel    Component Value Date/Time   CHOL 199 12/19/2019 1000   TRIG 180 (H) 12/19/2019 1000   HDL 45 12/19/2019 1000   CHOLHDL 4.4 12/19/2019 1000   CHOLHDL 3.2 10/27/2015 1132   VLDL 32 (H) 10/27/2015 1132   LDLCALC 122 (H) 12/19/2019 1000   LDLDIRECT 137.2 01/20/2013 1016      Wt Readings from Last 3 Encounters:  01/23/20 149 lb (67.6 kg)  01/09/20 149 lb (67.6 kg)  12/12/19 160 lb (72.6 kg)     Other studies Reviewed: Additional studies/ records that were reviewed today include: . Review of the above records demonstrates:    ASSESSMENT AND PLAN:  1. History of coronary artery disease-       2.  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   Still eating lots of salt   3. Hyperlipidemia -     .    He is intolerant to atorvastatin and rosuvastatin.  Will refer him to the lipid clinic for consideration of the PCSK9 inhibitor.    Encouraged him to get a primary medical doctor.  I have given him [place] and wellness.  He will give him a call.  Current medicines are reviewed at length with the patient today.  The patient does not have concerns regarding medicines.  The following changes have been made:  no change  Labs/ tests ordered today include:   No orders of the defined types were placed in this encounter.    Disposition:   Office visit in 6 months    Kristeen Miss, MD  01/23/2020 3:32 PM    [facility] [address] [place], [place], [place]  [address] Phone: [phone]; Fax: [phone]

## 2020-01-29 NOTE — Progress Notes (Signed)
S:    PCP: [place]  Patient arrives in no acute distress. Presents to the clinic for hypertension evaluation, counseling, and management. Patient was referred and last seen by Primary Care Provider on 01/09/20. At that visit, HCTZ was changed to chlorthalidone.  In interim, pt was seen by Cardiology, no medication changes made. Pt was referred to lipid clinic because he was unable to tolerate rosuvastatin and atorvastatin.  Current BP Medications include:   1. Amlodipine 10 mg daily 2. Carvedilol 6.25 mg BID - takes 1 tab daily in AM 3. Chlorthalidone 25 mg daily 4. Spironolactone 25 mg daily  Adverse Effects: - Reports mild sx of orthostatic hypotension sx in AM, manages by sitting and standing up slowly - Denies dizziness/lightheadedness, headaches, chest pain, shortness of breath when resting, blurred vision - Denies peripheral edema, flushing, palpitations, bothersome urination frequency  Adherence: pt missed 2 days of BP medication due to seeing BP 115/79, pt will skip a few days if he sees BP 110s/70s  Antihypertensives tried in the past include:  Olmesartan (not in chart, pt reported decreased sex drive), HCTZ, Metoprolol tartrate  Dietary: changed diet a lot, baked instad of fried foods, more salads, drinking more water, oatmeal  - Sodium: limits added to cooking, does not add to meals  - Caffeine: 1 cup daily of green tea Exercise: 30 min walk daily, runs around with grandchildren Tobacco use: 1 ppd (smoked since 69 years old) Alcohol: 1-2 beers per week, limits liquor Stress: manages well NSAID use: ibuprofen 250 mg 2x/week for chest discomfort, not chest pain  Prior to visit: - BP meds taken today: No , last dose 2 days ago - Caffeine use: Yes , 4 hours ago - Tobacco use: Yes , 30 min ago - Exercise: No   Tobacco Cessation: - pt unsure if he is ready to quit now, expresses interest in quitting - interested in trying bupropion and/or NRT products  O:  Home BP  readings: 139/82, 136/83 this morning. Did not bring BP log. Mainly checks BP when he does not take his medication. Uses wrist cuff.  Vitals:   01/30/20 1040 01/30/20 1043  BP: (!) 162/95 (!) 172/112  Pulse: 71 74    BMET    Component Value Date/Time   NA 137 12/19/2019 1000   K 3.9 12/19/2019 1000   CL 97 12/19/2019 1000   CO2 25 12/19/2019 1000   GLUCOSE 106 (H) 12/19/2019 1000   GLUCOSE 99 05/27/2018 1846   BUN 15 12/19/2019 1000   CREATININE 1.13 12/19/2019 1000   CREATININE 1.41 (H) 05/24/2016 1048   CALCIUM 9.8 12/19/2019 1000   GFRNONAA 67 12/19/2019 1000   GFRAA 77 12/19/2019 1000    Renal function: CrCl cannot be calculated (Patient's most recent lab result is older than the maximum 21 days allowed.).  Clinical ASCVD:  Hx of MI in 2008  A: - Clinic BP not at goal of < 130/80 due to not taking BP medications for 2 days - Home BP close to goal, unclear if wrist cuff accurate. - Reviewed BP goal vs low BP - Educated on appropriate BP technique at home - Smoking cessation: Pt in contemplation stage. Discussed bupropion and NRT. Pt plans to write down motivators to quit, tell friends/family, consider setting meaningful quit date. - Future considerations: retrial low dose ARB if additional agent needed, consider trial of bupropion and NRT  P: - Continue:  - Amlodipine 10 mg daily  - Carvedilol 6.25 mg BID - pt  will try to take BID  - Chlorthalidone 25 mg daily  - Spironolactone 25 mg daily - Check BP daily and write down, bring log and BP cuff to next visit.  - Do not skip days of BP meds. Call clinic if BP < 100/60 or sx of hypotension - Trial tyenol instead of ibuprofen - Per pt preference, retrial atorvastatin 40 mg twice a week and schedule initial visit with lipid clinic for PCSK9 discussion (referred by Cardiology)  Total time in face-to-face counseling 45 minutes.   F/U Pharmacist Clinic Visit in 3 weeks  Laverna Peace, PharmD PGY-1 Ambulatory Care  Pharmacy Resident 01/30/2020 10:52 AM

## 2020-01-30 ENCOUNTER — Other Ambulatory Visit: Payer: Self-pay

## 2020-01-30 ENCOUNTER — Encounter: Payer: Self-pay | Admitting: Pharmacist

## 2020-01-30 ENCOUNTER — Ambulatory Visit: Payer: Medicare (Managed Care) | Attending: Family Medicine | Admitting: Pharmacist

## 2020-01-30 VITALS — BP 172/112 | HR 74

## 2020-01-30 DIAGNOSIS — I10 Essential (primary) hypertension: Secondary | ICD-10-CM | POA: Diagnosis not present

## 2020-01-31 LAB — FECAL OCCULT BLOOD, IMMUNOCHEMICAL: Fecal Occult Bld: POSITIVE — AB

## 2020-02-01 ENCOUNTER — Other Ambulatory Visit: Payer: Self-pay | Admitting: Nurse Practitioner

## 2020-02-01 DIAGNOSIS — R195 Other fecal abnormalities: Secondary | ICD-10-CM

## 2020-02-01 DIAGNOSIS — I10 Essential (primary) hypertension: Secondary | ICD-10-CM

## 2020-02-03 ENCOUNTER — Encounter: Payer: Self-pay | Admitting: Internal Medicine

## 2020-02-06 ENCOUNTER — Ambulatory Visit: Payer: Medicare (Managed Care)

## 2020-02-20 ENCOUNTER — Ambulatory Visit: Payer: Medicare (Managed Care) | Admitting: Pharmacist

## 2020-03-05 ENCOUNTER — Ambulatory Visit: Payer: Medicare (Managed Care) | Attending: Nurse Practitioner | Admitting: Pharmacist

## 2020-03-05 ENCOUNTER — Encounter: Payer: Self-pay | Admitting: Pharmacist

## 2020-03-05 ENCOUNTER — Other Ambulatory Visit: Payer: Self-pay

## 2020-03-05 DIAGNOSIS — I10 Essential (primary) hypertension: Secondary | ICD-10-CM

## 2020-03-05 DIAGNOSIS — I2510 Atherosclerotic heart disease of native coronary artery without angina pectoris: Secondary | ICD-10-CM | POA: Diagnosis not present

## 2020-03-05 MED ORDER — CHLORTHALIDONE 25 MG PO TABS: 25.0000 mg | ORAL_TABLET | Freq: Every day | ORAL | 1 refills | Status: DC

## 2020-03-05 MED ORDER — SPIRONOLACTONE 25 MG PO TABS: 25.0000 mg | ORAL_TABLET | Freq: Every day | ORAL | 1 refills | Status: DC

## 2020-03-05 NOTE — Progress Notes (Signed)
   S:    PCP: Fleming  Patient arrives in no acute distress. Presents to the clinic for hypertension evaluation, counseling, and management. Patient was referred and last seen by Primary Care Provider on 01/09/20. At that visit, HCTZ was changed to chlorthalidone. We saw him on 01/30/2020 and BP was elevated secondary to medication noncompliance.   Current BP Medications include:   1. Amlodipine 10 mg daily 2. Carvedilol 6.25 mg BID 3. Chlorthalidone 25 mg daily - out of medication for the last two days.  4. Spironolactone 25 mg daily  Adverse Effects: - Denies dizziness/lightheadedness, headaches, chest pain, shortness of breath when resting, blurred vision - Denies peripheral edema, flushing, palpitations, bothersome urination frequency  Adherence: pt missed 2 days of chlorthalidone due to running out of medication  Antihypertensives tried in the past include:  Olmesartan (not in chart, pt reported decreased sex drive), HCTZ, Metoprolol tartrate  Dietary: changed diet a lot, baked instad of fried foods, more salads, drinking more water, oatmeal  - Sodium: limits added to cooking, does not add to meals  - Caffeine: 1 cup daily of green tea Exercise: 30 min walk daily, runs around with grandchildren Tobacco use: 1 ppd (smoked 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 Did not bring BP log. Mainly checks BP when he does not take his medication. Uses wrist cuff.   Vitals:   03/05/20 0944  BP: (!) 157/85  Pulse: 74    BMET    Component Value Date/Time   NA 137 12/19/2019 1000   K 3.9 12/19/2019 1000   CL 97 12/19/2019 1000   CO2 25 12/19/2019 1000   GLUCOSE 106 (H)  12/19/2019 1000   GLUCOSE 99 05/27/2018 1846   BUN 15 12/19/2019 1000   CREATININE 1.13 12/19/2019 1000   CREATININE 1.41 (H) 05/24/2016 1048   CALCIUM 9.8 12/19/2019 1000   GFRNONAA 67 12/19/2019 1000   GFRAA 77 12/19/2019 1000    Renal function: CrCl cannot be calculated (Patient's most recent lab result is older than the maximum 21 days allowed.).  Clinical ASCVD:  Hx of MI in 2008  A: - Clinic BP not at goal of < 130/80 due to not taking chlorthalidone for 2 days - Home BP close to goal, unclear if wrist cuff accurate. - Reviewed BP goal vs low BP - Educated on appropriate BP technique at home  P: - Continue:  - Amlodipine 10 mg daily  - Carvedilol 6.25 mg BID   - Chlorthalidone 25 mg daily - refills given  - Spironolactone 25 mg daily - Check BP daily and write down, bring log and BP cuff to next visit.  - Do not skip days of BP meds. Call clinic if BP < 100/60 or sx of hypotension - Counseling given concerning lifestyle needed for improved BP control (sodium restriction, caffeine restriction, proper sleep hygiene, at least 150 minutes of aerobic exercise)   Total time in face-to-face counseling 45 minutes.   F/U Pharmacist Clinic Visit in 4 weeks  Butch Penny, PharmD, CPP Clinical Pharmacist [facility] [phone]

## 2020-03-25 ENCOUNTER — Other Ambulatory Visit: Payer: Self-pay | Admitting: Nurse Practitioner

## 2020-03-25 DIAGNOSIS — I10 Essential (primary) hypertension: Secondary | ICD-10-CM

## 2020-03-28 ENCOUNTER — Other Ambulatory Visit: Payer: Self-pay | Admitting: Nurse Practitioner

## 2020-03-28 DIAGNOSIS — I10 Essential (primary) hypertension: Secondary | ICD-10-CM

## 2020-04-05 ENCOUNTER — Ambulatory Visit: Payer: Medicare (Managed Care) | Admitting: Internal Medicine

## 2020-04-09 ENCOUNTER — Ambulatory Visit: Payer: Medicare (Managed Care) | Admitting: Pharmacist

## 2020-04-11 ENCOUNTER — Other Ambulatory Visit: Payer: Self-pay | Admitting: Family Medicine

## 2020-04-11 DIAGNOSIS — I10 Essential (primary) hypertension: Secondary | ICD-10-CM

## 2020-04-11 NOTE — Telephone Encounter (Signed)
Requested medication (s) are due for refill today: yes  Requested medication (s) are on the active medication list: expired  Last refill:  01/12/20  Future visit scheduled: no  Notes to clinic:  med expired 04/11/20   Requested Prescriptions  Pending Prescriptions Disp Refills   amLODipine (NORVASC) 10 MG tablet [Pharmacy Med Name: AMLODIPINE BESYLATE 10 MG TAB] 90 tablet 0    Sig: TAKE 1 TABLET BY MOUTH EVERY DAY      Cardiovascular:  Calcium Channel Blockers Failed - 04/11/2020  9:56 AM      Failed - Last BP in normal range    BP Readings from Last 1 Encounters:  03/05/20 (!) 157/85          Passed - Valid encounter within last 6 months    Recent Outpatient Visits           1 month ago Essential hypertension   [facility] [place], Cornelius Moras, RPH-CPP   2 months ago Essential hypertension   [facility] [place], Cornelius Moras, RPH-CPP   3 months ago Essential hypertension   [facility] [place], Shea Stakes, NP   4 months ago Essential hypertension   [facility] [place], Shea Stakes, NP

## 2020-05-13 ENCOUNTER — Other Ambulatory Visit: Payer: Self-pay | Admitting: Family Medicine

## 2020-05-13 DIAGNOSIS — I10 Essential (primary) hypertension: Secondary | ICD-10-CM

## 2020-06-11 ENCOUNTER — Ambulatory Visit: Payer: Medicare (Managed Care) | Admitting: Pharmacist

## 2020-06-12 ENCOUNTER — Other Ambulatory Visit: Payer: Self-pay | Admitting: Family Medicine

## 2020-06-12 DIAGNOSIS — I10 Essential (primary) hypertension: Secondary | ICD-10-CM

## 2020-06-25 ENCOUNTER — Ambulatory Visit: Payer: Medicare (Managed Care) | Attending: Nurse Practitioner | Admitting: Pharmacist

## 2020-06-25 ENCOUNTER — Encounter: Payer: Self-pay | Admitting: Pharmacist

## 2020-06-25 ENCOUNTER — Other Ambulatory Visit: Payer: Self-pay

## 2020-06-25 VITALS — BP 144/92 | HR 72

## 2020-06-25 DIAGNOSIS — I10 Essential (primary) hypertension: Secondary | ICD-10-CM

## 2020-06-25 MED ORDER — CHLORTHALIDONE 25 MG PO TABS: 25.0000 mg | ORAL_TABLET | Freq: Every day | ORAL | 1 refills | Status: DC

## 2020-06-25 MED ORDER — CARVEDILOL 6.25 MG PO TABS: 6.2500 mg | ORAL_TABLET | Freq: Two times a day (BID) | ORAL | 1 refills | Status: DC

## 2020-06-25 MED ORDER — AMLODIPINE BESYLATE 10 MG PO TABS: 10.0000 mg | ORAL_TABLET | Freq: Every day | ORAL | 1 refills | Status: DC

## 2020-06-25 MED ORDER — SPIRONOLACTONE 50 MG PO TABS: 50.0000 mg | ORAL_TABLET | Freq: Every day | ORAL | 1 refills | Status: DC

## 2020-06-25 NOTE — Progress Notes (Signed)
   S:    PCP: Fleming  Patient arrives in no acute distress. Presents to the clinic for hypertension evaluation, counseling, and management. Patient was referred and last seen by Primary Care Provider on 01/09/20. Pharmacy last saw him 03/05/2020.   Today, pt reports compliance. Took all medications this morning.   Current BP Medications include:   1. Amlodipine 10 mg daily 2. Carvedilol 6.25 mg BID 3. Chlorthalidone 25 mg daily 4. Spironolactone 25 mg daily  Adverse Effects: - Denies dizziness/lightheadedness, headaches, chest pain, shortness of breath when resting, blurred vision - Denies peripheral edema, flushing, palpitations, bothersome urination frequency  Antihypertensives tried in the past include:  Olmesartan (not in chart, pt reported decreased sex drive), HCTZ, Metoprolol tartrate  Dietary: changed diet a lot, baked instad of fried foods, more salads, drinking more water, oatmeal  - Sodium: limits added to cooking, does not add to meals  - Caffeine: 1 cup daily of green tea  Exercise: 30 min walk daily, runs around with grandchildren  Fhx/Social hx:  - Tobacco use: 1 ppd (smoked since 70 years old) - Alcohol: 1-2 beers per week, limits liquor   O:  Home BP readings: reports that BP is closer to goal at home when he takes later in the day.   Vitals:   06/25/20 1043  BP: (!) 144/92    BMET    Component Value Date/Time   NA 137 12/19/2019 1000   K 3.9 12/19/2019 1000   CL 97 12/19/2019 1000   CO2 25 12/19/2019 1000   GLUCOSE 106 (H) 12/19/2019 1000   GLUCOSE 99 05/27/2018 1846   BUN 15 12/19/2019 1000   CREATININE 1.13 12/19/2019 1000   CREATININE 1.41 (H) 05/24/2016 1048   CALCIUM 9.8 12/19/2019 1000   GFRNONAA 67 12/19/2019 1000   GFRAA 77 12/19/2019 1000    Renal function: CrCl cannot be calculated (Patient's most recent lab result is older than the maximum 21 days allowed.).  Clinical ASCVD:  Hx of MI in 2008  A: - Clinic BP not at goal of <  130/80. - Home BP close to goal, unclear if wrist cuff accurate. - Reviewed BP goal vs low BP - Educated on appropriate BP technique at home  P: - Increase  - spironolactone to 50 mg daily - Continue:  - Amlodipine 10 mg daily  - Carvedilol 6.25 mg BID   - Chlorthalidone 25 mg daily  - Check BP daily and write down, bring log and BP cuff to next visit.  - Do not skip days of BP meds. Call clinic if BP < 100/60 or sx of hypotension - Counseling given concerning lifestyle needed for improved BP control (sodium restriction, caffeine restriction, proper sleep hygiene, at least 150 minutes of aerobic exercise)   Total time in face-to-face counseling 45 minutes.   F/U PCP Clinic Visit in March  Butch Penny, PharmD, [place], CPP Clinical Pharmacist [facility] [phone]

## 2020-06-26 LAB — CMP14+EGFR
ALT: 21 IU/L (ref 0–44)
AST: 23 IU/L (ref 0–40)
Albumin/Globulin Ratio: 1.6 (ref 1.2–2.2)
Albumin: 4.9 g/dL — ABNORMAL HIGH (ref 3.8–4.8)
Alkaline Phosphatase: 77 IU/L (ref 44–121)
BUN/Creatinine Ratio: 18 (ref 10–24)
BUN: 20 mg/dL (ref 8–27)
Bilirubin Total: 0.6 mg/dL (ref 0.0–1.2)
CO2: 22 mmol/L (ref 20–29)
Calcium: 9.9 mg/dL (ref 8.6–10.2)
Chloride: 95 mmol/L — ABNORMAL LOW (ref 96–106)
Creatinine, Ser: 1.12 mg/dL (ref 0.76–1.27)
GFR calc Af Amer: 78 mL/min/{1.73_m2} (ref >59)
GFR calc non Af Amer: 68 mL/min/{1.73_m2} (ref >59)
Globulin, Total: 3 g/dL (ref 1.5–4.5)
Glucose: 111 mg/dL — ABNORMAL HIGH (ref 65–99)
Potassium: 4.1 mmol/L (ref 3.5–5.2)
Sodium: 133 mmol/L — ABNORMAL LOW (ref 134–144)
Total Protein: 7.9 g/dL (ref 6.0–8.5)

## 2020-08-27 ENCOUNTER — Telehealth: Payer: Self-pay | Admitting: Nurse Practitioner

## 2020-08-27 ENCOUNTER — Other Ambulatory Visit: Payer: Self-pay

## 2020-08-27 ENCOUNTER — Ambulatory Visit: Payer: Medicare (Managed Care) | Attending: Nurse Practitioner | Admitting: Nurse Practitioner

## 2020-08-27 ENCOUNTER — Encounter: Payer: Self-pay | Admitting: Nurse Practitioner

## 2020-08-27 VITALS — BP 135/79 | HR 78 | Resp 16 | Wt 148.6 lb

## 2020-08-27 DIAGNOSIS — Z1159 Encounter for screening for other viral diseases: Secondary | ICD-10-CM

## 2020-08-27 DIAGNOSIS — R7303 Prediabetes: Secondary | ICD-10-CM

## 2020-08-27 DIAGNOSIS — D72819 Decreased white blood cell count, unspecified: Secondary | ICD-10-CM | POA: Diagnosis not present

## 2020-08-27 DIAGNOSIS — I10 Essential (primary) hypertension: Secondary | ICD-10-CM | POA: Diagnosis not present

## 2020-08-27 DIAGNOSIS — E785 Hyperlipidemia, unspecified: Secondary | ICD-10-CM | POA: Diagnosis not present

## 2020-08-27 MED ORDER — PRAVASTATIN SODIUM 40 MG PO TABS: 40.0000 mg | ORAL_TABLET | ORAL | 3 refills | Status: DC

## 2020-08-27 NOTE — Telephone Encounter (Signed)
Returned pt call. Made pt aware that he will need to contact the pharmacy pt states he is at the pharmacy and they only have 2 rxs ready for him. Pt states please disregard message.

## 2020-08-27 NOTE — Telephone Encounter (Signed)
Pt is calling because he received a text regarding 8 medications that where sent to the pharmacy. Pt states that he is not aware that 8 medications where sent please advise CB- [phone]

## 2020-08-27 NOTE — Telephone Encounter (Signed)
Patient called to ask the nurse to call him regarding the 8 prescriptions that were sent to pharmacy for him.  He did not know the doctor was sending these to pharmacy and has some questions as to why.  Please advise and call patient to discuss at [phone]

## 2020-08-27 NOTE — Progress Notes (Signed)
Assessment & Plan:  Giovoni was seen today for hypertension.  Diagnoses and all orders for this visit:  Primary hypertension -     Basic metabolic panel; Future Continue all antihypertensives as prescribed.  Remember to bring in your blood pressure log with you for your follow up appointment.  DASH/Mediterranean Diets are healthier choices for HTN.   Prediabetes -     Hemoglobin A1c; Future  Dyslipidemia, goal LDL below 70 -     Lipid panel; Future INSTRUCTIONS: Work on a low fat, heart healthy diet and participate in regular aerobic exercise program by working out at least 150 minutes per week; 5 days a week-30 minutes per day. Avoid red meat/beef/steak,  fried foods. junk foods, sodas, sugary drinks, unhealthy snacking, alcohol and smoking.  Drink at least 80 oz of water per day and monitor your carbohydrate intake daily.    Leukopenia, unspecified type -     CBC; Future  Need for hepatitis C screening test -     HCV Ab w Reflex to Quant PCR; Future    Patient has been counseled on age-appropriate routine health concerns for screening and prevention. These are reviewed and up-to-date. Referrals have been placed accordingly. Immunizations are up-to-date or declined.    Subjective:   Chief Complaint  Patient presents with  . Hypertension   HPI Javier Rios 70 y.o. male presents to office today for follow up.  has a past medical history of Chest pain, Coronary artery disease status post PTCA and stenting of the RCA, Hypertension, LAD stenosis, and MI, old (2008).   HTN Reports BP 118/77 at home this morning. Average readings at home: 120-130/70-80s. Taking amlodipine 10 mg daily, carvedilol 6.25 mg twice daily, chlorthalidone 25 mg daily and spironolactone 50 mg daily as prescribed. Denies chest pain, shortness of breath, palpitations, lightheadedness, dizziness, headaches or BLE edema.  He still eats a moderate amount sodium in his foods. BP Readings from Last 3 Encounters:   08/27/20 135/79  06/25/20 (!) 144/92  03/05/20 (!) 157/85   CAD Intolerance to atorvastatin and rosuvastatin.  Dr. Elease Hashimoto had referred him in for consideration of the PCSK9 inhibitor however there was no follow-up from the patient for this. Will try pravastatin every other day until he is seen by Cardiology Review of Systems  Constitutional: Negative for fever, malaise/fatigue and weight loss.  HENT: Negative.  Negative for nosebleeds.   Eyes: Negative.  Negative for blurred vision, double vision and photophobia.  Respiratory: Negative.  Negative for cough and shortness of breath.   Cardiovascular: Negative.  Negative for chest pain, palpitations and leg swelling.  Gastrointestinal: Negative.  Negative for heartburn, nausea and vomiting.  Musculoskeletal: Negative.  Negative for myalgias.  Neurological: Negative.  Negative for dizziness, focal weakness, seizures and headaches.  Psychiatric/Behavioral: Negative.  Negative for suicidal ideas.    Past Medical History:  Diagnosis Date  . Chest pain   . Coronary artery disease    Status post PTCA and stenting of his right coronary artery in March, 2008. He also has a 40-50% LAD stenosis  . Hypertension   . LAD stenosis    MODERATE BETWEEN 50-60%  . 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 CARDIAC CATHETERIZATION  08/23/2006   EF 65%. THERE IS INFEROBASILAR AKINESIS  . CARDIOVASCULAR STRESS TEST  04/21/2010   EF 51%. NO SIGNIFICANT ST SEGMENT CHANGE SUGGESTIVE OF ISCHEMIA  . CORONARY ANGIOPLASTY WITH STENT PLACEMENT  STENTING OF HIS RCA    Family History  Problem Relation Age of Onset  . Hypertension Mother   . Hypertension Brother   . Hypertension Daughter     Social History Reviewed with no changes to be made today.   Outpatient Medications Prior to Visit  Medication Sig Dispense Refill  . albuterol (PROAIR HFA) 108 (90 Base) MCG/ACT inhaler INHALE 2 PUFFS INTO THE LUNGS  EVERY 6 (SIX) HOURS AS NEEDED FOR WHEEZING OR SHORTNESS OF BREATH. 1 Inhaler 3  . amLODipine (NORVASC) 10 MG tablet Take 1 tablet (10 mg total) by mouth daily. Please make PCP appointment. 90 tablet 1  . aspirin 81 MG tablet Take 81 mg by mouth daily.    . carvedilol (COREG) 6.25 MG tablet Take 1 tablet (6.25 mg total) by mouth 2 (two) times daily. 180 tablet 1  . chlorthalidone (HYGROTON) 25 MG tablet Take 1 tablet (25 mg total) by mouth daily. 90 tablet 1  . nitroGLYCERIN (NITROSTAT) 0.4 MG SL tablet PLACE 1 TABLET UNDER THE TOUNGE EVERY AS NEEDED FOR CHEST PAIN 25 tablet 3  . sildenafil (VIAGRA) 100 MG tablet TAKE 1 TABLET BY MOUTH DAILY AS NEEDED FOR ERECTILE DYSFUNCTION 10 tablet 5  . spironolactone (ALDACTONE) 50 MG tablet Take 1 tablet (50 mg total) by mouth daily. 90 tablet 1   No facility-administered medications prior to visit.    Allergies  Allergen Reactions  . Rosuvastatin Other (See Comments)    Muscle aches   . Atorvastatin     Muscle aches       Objective:    BP 135/79   Pulse 78   Resp 16   Wt 148 lb 9.6 oz (67.4 kg)   SpO2 97%   BMI 21.63 kg/m  Wt Readings from Last 3 Encounters:  08/27/20 148 lb 9.6 oz (67.4 kg)  01/23/20 149 lb (67.6 kg)  01/09/20 149 lb (67.6 kg)    Physical Exam Vitals and nursing note reviewed.  Constitutional:      Appearance: He is well-developed.  HENT:     Head: Normocephalic and atraumatic.  Cardiovascular:     Rate and Rhythm: Normal rate and regular rhythm.     Heart sounds: Normal heart sounds. No murmur heard. No friction rub. No gallop.   Pulmonary:     Effort: Pulmonary effort is normal. No tachypnea or respiratory distress.     Breath sounds: Normal breath sounds. No decreased breath sounds, wheezing, rhonchi or rales.  Chest:     Chest wall: No tenderness.  Abdominal:     General: Bowel sounds are normal.     Palpations: Abdomen is soft.  Musculoskeletal:        General: Normal range of motion.      Cervical back: Normal range of motion.  Skin:    General: Skin is warm and dry.  Neurological:     Mental Status: He is alert and oriented to person, place, and time.     Coordination: Coordination normal.  Psychiatric:        Behavior: Behavior normal. Behavior is cooperative.        Thought Content: Thought content normal.        Judgment: Judgment normal.          Patient has been counseled extensively about nutrition and exercise as well as the importance of adherence with medications and regular follow-up. The patient was given clear instructions to go to ER or return to medical center if symptoms  don't improve, worsen or new problems develop. The patient verbalized understanding.   Follow-up: Return in about 3 months (around 11/27/2020).   Claiborne Rigg, FNP-BC [facility] and [facility] [place], [place] [phone]   08/27/2020, 12:03 PM

## 2020-08-28 LAB — HEMOGLOBIN A1C
Est. average glucose Bld gHb Est-mCnc: 120 mg/dL
Hgb A1c MFr Bld: 5.8 % — ABNORMAL HIGH (ref 4.8–5.6)

## 2020-08-28 LAB — BASIC METABOLIC PANEL
BUN/Creatinine Ratio: 15 (ref 10–24)
BUN: 17 mg/dL (ref 8–27)
CO2: 20 mmol/L (ref 20–29)
Calcium: 10.1 mg/dL (ref 8.6–10.2)
Chloride: 95 mmol/L — ABNORMAL LOW (ref 96–106)
Creatinine, Ser: 1.11 mg/dL (ref 0.76–1.27)
Glucose: 108 mg/dL — ABNORMAL HIGH (ref 65–99)
Potassium: 4.5 mmol/L (ref 3.5–5.2)
Sodium: 134 mmol/L (ref 134–144)
eGFR: 73 mL/min/{1.73_m2} (ref >59)

## 2020-08-28 LAB — LIPID PANEL
Chol/HDL Ratio: 4.6 ratio (ref 0.0–5.0)
Cholesterol, Total: 206 mg/dL — ABNORMAL HIGH (ref 100–199)
HDL: 45 mg/dL (ref >39)
LDL Chol Calc (NIH): 135 mg/dL — ABNORMAL HIGH (ref 0–99)
Triglycerides: 142 mg/dL (ref 0–149)
VLDL Cholesterol Cal: 26 mg/dL (ref 5–40)

## 2020-08-28 LAB — CBC
Hematocrit: 45.1 % (ref 37.5–51.0)
Hemoglobin: 15.6 g/dL (ref 13.0–17.7)
MCH: 32.6 pg (ref 26.6–33.0)
MCHC: 34.6 g/dL (ref 31.5–35.7)
MCV: 94 fL (ref 79–97)
Platelets: 309 10*3/uL (ref 150–450)
RBC: 4.79 x10E6/uL (ref 4.14–5.80)
RDW: 13.6 % (ref 11.6–15.4)
WBC: 5.6 10*3/uL (ref 3.4–10.8)

## 2020-08-28 LAB — HCV AB W REFLEX TO QUANT PCR: HCV Ab: <0.1 s/co ratio (ref 0.0–0.9)

## 2020-08-28 LAB — HCV INTERPRETATION

## 2020-12-06 ENCOUNTER — Telehealth: Payer: Self-pay | Admitting: Nurse Practitioner

## 2020-12-06 NOTE — Telephone Encounter (Signed)
VM was left for AWV 

## 2020-12-06 NOTE — Telephone Encounter (Signed)
VM was left.

## 2020-12-19 ENCOUNTER — Other Ambulatory Visit: Payer: Self-pay | Admitting: Nurse Practitioner

## 2020-12-19 DIAGNOSIS — I10 Essential (primary) hypertension: Secondary | ICD-10-CM

## 2020-12-19 NOTE — Telephone Encounter (Signed)
Requested Prescriptions  Pending Prescriptions Disp Refills  . spironolactone (ALDACTONE) 50 MG tablet [Pharmacy Med Name: SPIRONOLACTONE 50 MG TABLET] 90 tablet 1    Sig: TAKE 1 TABLET BY MOUTH EVERY DAY     Cardiovascular: Diuretics - Aldosterone Antagonist Passed - 12/19/2020  9:44 AM      Passed - Cr in normal range and within 360 days    Creat  Date Value Ref Range Status  05/24/2016 1.41 (H) 0.70 - 1.25 mg/dL Final    Comment:      For patients > or = 68 years of age: The upper reference limit for Creatinine is approximately 13% higher for people identified as African-American.      Creatinine, Ser  Date Value Ref Range Status  08/27/2020 1.11 0.76 - 1.27 mg/dL Final         Passed - K in normal range and within 360 days    Potassium  Date Value Ref Range Status  08/27/2020 4.5 3.5 - 5.2 mmol/L Final         Passed - Na in normal range and within 360 days    Sodium  Date Value Ref Range Status  08/27/2020 134 134 - 144 mmol/L Final         Passed - Last BP in normal range    BP Readings from Last 1 Encounters:  08/27/20 135/79         Passed - Valid encounter within last 6 months    Recent Outpatient Visits          3 months ago Primary hypertension   [facility] [place], Shea Stakes, NP   5 months ago Essential hypertension   [facility] [place], Cornelius Moras, RPH-CPP   9 months ago Essential hypertension   [facility] [place], Cornelius Moras, RPH-CPP   10 months ago Essential hypertension   [facility] [place], Cornelius Moras, RPH-CPP   11 months ago Essential hypertension   [facility] [place], Shea Stakes, NP      Future Appointments            In 5 days Post-COVID Care Clinic at [facility]

## 2020-12-24 ENCOUNTER — Other Ambulatory Visit: Payer: Self-pay

## 2020-12-24 ENCOUNTER — Ambulatory Visit (INDEPENDENT_AMBULATORY_CARE_PROVIDER_SITE_OTHER): Payer: Medicare (Managed Care) | Admitting: Nurse Practitioner

## 2020-12-24 VITALS — BP 136/97 | HR 77 | Temp 97.9°F | Resp 18 | Ht 69.0 in | Wt 143.0 lb

## 2020-12-24 DIAGNOSIS — F17200 Nicotine dependence, unspecified, uncomplicated: Secondary | ICD-10-CM | POA: Diagnosis not present

## 2020-12-24 DIAGNOSIS — Z0000 Encounter for general adult medical examination without abnormal findings: Secondary | ICD-10-CM

## 2020-12-24 NOTE — Patient Instructions (Addendum)
[name] , Thank you for taking time to come for your Medicare Wellness Visit. I appreciate your ongoing commitment to your health goals. Please review the following plan we discussed and let me know if I can assist you in the future.   These are the goals we discussed:  Goals   None     This is a list of the screening recommended for you and due dates:  Health Maintenance  Topic Date Due   COVID-19 Vaccine (1) Never done   Tetanus Vaccine  Never done   Zoster (Shingles) Vaccine (1 of 2) Never done   Pneumonia vaccines (1 of 2 - PCV13) Never done   Flu Shot  01/10/2021   Hepatitis C Screening: USPSTF Recommendation to screen - Ages 7318-79 yo.  Completed   HPV Vaccine  Aged Out   Colon Cancer Screening  Discontinued   Critical care medicine: Principles of diagnosis and management in the adult (4th ed., pp. [id number]). [employer]unders."> Miller's anesthesia (8th ed., pp. [phone]). [employer]unders.">  Advance Directive  Advance directives are legal documents that allow you to make decisions about your health care and medical treatment in case you become unable to communicate for yourself. Advance directives let your wishes be known to family, friends,and health care providers. Discussing and writing advance directives should happen over time rather than all at once. Advance directives can be changed and updated at any time. There are different types of advance directives, such as: Medical power of attorney. Living will. Do not resuscitate (DNR) order or do not attempt resuscitation (DNAR) order. Health care proxy and medical power of attorney A health care proxy is also called a health care agent. This person is appointed to make medical decisions for you when you are unable to make decisions for yourself. Generally, people ask a trusted friend or family member to act as their proxy and represent their preferences. Make sure you have an agreement with your trusted person to act as your proxy. A proxy  may have tomake a medical decision on your behalf if your wishes are not known. A medical power of attorney, also called a durable power of attorney for health care, is a legal document that names your health care proxy. Depending on the laws in your state, the document may need to be: Signed. Notarized. Dated. Copied. Witnessed. Incorporated into your medical record. You may also want to appoint a trusted person to manage your money in the event you are unable to do so. This is called a durable power of attorney for finances. It is a separate legal document from the durable power of attorney for health care. You may choose your health care proxy or someone different toact as your agent in money matters. If you do not appoint a proxy, or there is a concern that the proxy is not acting in your best interest, a court may appoint a guardian to act on yourbehalf. Living will A living will is a set of instructions that state your wishes about medical care when you cannot express them yourself. Health care providers should keep a copy of your living will in your medical record. You may want to give a copy to family members or friends. To alert caregivers in case of an emergency, you can place a card in your wallet to let them know that you have a living will and where they can find it. A living will is used if you become: Terminally ill. Disabled. Unable to communicate or  make decisions. The following decisions should be included in your living will: To use or not to use life support equipment, such as dialysis machines and breathing machines (ventilators). Whether you want a DNR or DNAR order. This tells health care providers not to use cardiopulmonary resuscitation (CPR) if breathing or heartbeat stops. To use or not to use tube feeding. To be given or not to be given food and fluids. Whether you want comfort (palliative) care when the goal becomes comfort rather than a cure. Whether you want to  donate your organs and tissues. A living will does not give instructions for distributing your money andproperty if you should pass away. DNR or DNAR A DNR or DNAR order is a request not to have CPR in the event that your heart stops beating or you stop breathing. If a DNR or DNAR order has not been made and shared, a health care provider will try to help any patient whose heart has stopped or who has stopped breathing. If you plan to have surgery, talk with your health care provider about how your DNR or DNAR order will be followed ifproblems occur. What if I do not have an advance directive? Some states assign family decision makers to act on your behalf if you do not have an advance directive. Each state has its own laws about advance directives. You may want to check with your health care provider, attorney, orstate representative about the laws in your state. Summary Advance directives are legal documents that allow you to make decisions about your health care and medical treatment in case you become unable to communicate for yourself. The process of discussing and writing advance directives should happen over time. You can change and update advance directives at any time. Advance directives may include a medical power of attorney, a living will, and a DNR or DNAR order. This information is not intended to replace advice given to you by your health care provider. Make sure you discuss any questions you have with your healthcare provider. Document Revised: 03/02/2020 Document Reviewed: 03/02/2020 Elsevier Patient Education  2022 [employer].  Fall Prevention in the Home, Adult Falls can cause injuries and can happen to people of all ages. There are many things you can do to make your home safe and to help prevent falls. Ask forhelp when making these changes. What actions can I take to prevent falls? General Instructions Use good lighting in all rooms. Replace any light bulbs that burn  out. Turn on the lights in dark areas. Use night-lights. Keep items that you use often in easy-to-reach places. Lower the shelves around your home if needed. Set up your furniture so you have a clear path. Avoid moving your furniture around. Do not have throw rugs or other things on the floor that can make you trip. Avoid walking on wet floors. If any of your floors are uneven, fix them. Add color or contrast paint or tape to clearly mark and help you see: Grab bars or handrails. First and last steps of staircases. Where the edge of each step is. If you use a stepladder: Make sure that it is fully opened. Do not climb a closed stepladder. Make sure the sides of the stepladder are locked in place. Ask someone to hold the stepladder while you use it. Know where your pets are when moving through your home. What can I do in the bathroom?     Keep the floor dry. Clean up any water on the floor  right away. Remove soap buildup in the tub or shower. Use nonskid mats or decals on the floor of the tub or shower. Attach bath mats securely with double-sided, nonslip rug tape. If you need to sit down in the shower, use a plastic, nonslip stool. Install grab bars by the toilet and in the tub and shower. Do not use towel bars as grab bars. What can I do in the bedroom? Make sure that you have a light by your bed that is easy to reach. Do not use any sheets or blankets for your bed that hang to the floor. Have a firm chair with side arms that you can use for support when you get dressed. What can I do in the kitchen? Clean up any spills right away. If you need to reach something above you, use a step stool with a grab bar. Keep electrical cords out of the way. Do not use floor polish or wax that makes floors slippery. What can I do with my stairs? Do not leave any items on the stairs. Make sure that you have a light switch at the top and the bottom of the stairs. Make sure that there are  handrails on both sides of the stairs. Fix handrails that are broken or loose. Install nonslip stair treads on all your stairs. Avoid having throw rugs at the top or bottom of the stairs. Choose a carpet that does not hide the edge of the steps on the stairs. Check carpeting to make sure that it is firmly attached to the stairs. Fix carpet that is loose or worn. What can I do on the outside of my home? Use bright outdoor lighting. Fix the edges of walkways and driveways and fix any cracks. Remove anything that might make you trip as you walk through a door, such as a raised step or threshold. Trim any bushes or trees on paths to your home. Check to see if handrails are loose or broken and that both sides of all steps have handrails. Install guardrails along the edges of any raised decks and porches. Clear paths of anything that can make you trip, such as tools or rocks. Have leaves, snow, or ice cleared regularly. Use sand or salt on paths during winter. Clean up any spills in your garage right away. This includes grease or oil spills. What other actions can I take? Wear shoes that: Have a low heel. Do not wear high heels. Have rubber bottoms. Feel good on your feet and fit well. Are closed at the toe. Do not wear open-toe sandals. Use tools that help you move around if needed. These include: Canes. Walkers. Scooters. Crutches. Review your medicines with your doctor. Some medicines can make you feel dizzy. This can increase your chance of falling. Ask your doctor what else you can do to help prevent falls. Where to find more information Centers for Disease Control and Prevention, [employer]: FootballExhibition.com.br [employer] on Aging: https://walker.com/ Contact a doctor if: You are afraid of falling at home. You feel weak, drowsy, or dizzy at home. You fall at home. Summary There are many simple things that you can do to make your home safe and to help prevent falls. Ways to make your  home safe include removing things that can make you trip and installing grab bars in the bathroom. Ask for help when making these changes in your home. This information is not intended to replace advice given to you by your health care provider. Make sure you  discuss any questions you have with your healthcare provider. Document Revised: 12/31/2019 Document Reviewed: 12/31/2019 Elsevier Patient Education  2022 Elsevier Inc.  Health Maintenance, Male Adopting a healthy lifestyle and getting preventive care are important in promoting health and wellness. Ask your health care provider about: The right schedule for you to have regular tests and exams. Things you can do on your own to prevent diseases and keep yourself healthy. What should I know about diet, weight, and exercise? Eat a healthy diet  Eat a diet that includes plenty of vegetables, fruits, low-fat dairy products, and lean protein. Do not eat a lot of foods that are high in solid fats, added sugars, or sodium.  Maintain a healthy weight Body mass index (BMI) is a measurement that can be used to identify possible weight problems. It estimates body fat based on height and weight. Your health care provider can help determine your BMI and help you achieve or maintain ahealthy weight. Get regular exercise Get regular exercise. This is one of the most important things you can do for your health. Most adults should: Exercise for at least 150 minutes each week. The exercise should increase your heart rate and make you sweat (moderate-intensity exercise). Do strengthening exercises at least twice a week. This is in addition to the moderate-intensity exercise. Spend less time sitting. Even light physical activity can be beneficial. Watch cholesterol and blood lipids Have your blood tested for lipids and cholesterol at 68 years of age, then havethis test every 5 years. You may need to have your cholesterol levels checked more often if: Your  lipid or cholesterol levels are high. You are older than 68 years of age. You are at high risk for heart disease. What should I know about cancer screening? Many types of cancers can be detected early and may often be prevented. Depending on your health history and family history, you may need to have cancer screening at various ages. This may include screening for: Colorectal cancer. Prostate cancer. Skin cancer. Lung cancer. What should I know about heart disease, diabetes, and high blood pressure? Blood pressure and heart disease High blood pressure causes heart disease and increases the risk of stroke. This is more likely to develop in people who have high blood pressure readings, are of African descent, or are overweight. Talk with your health care provider about your target blood pressure readings. Have your blood pressure checked: Every 3-5 years if you are 69-87 years of age. Every year if you are 36 years old or older. If you are between the ages of 66 and 26 and are a current or former smoker, ask your health care provider if you should have a one-time screening for abdominal aortic aneurysm (AAA). Diabetes Have regular diabetes screenings. This checks your fasting blood sugar level. Have the screening done: Once every three years after age 64 if you are at a normal weight and have a low risk for diabetes. More often and at a younger age if you are overweight or have a high risk for diabetes. What should I know about preventing infection? Hepatitis B If you have a higher risk for hepatitis B, you should be screened for this virus. Talk with your health care provider to find out if you are at risk forhepatitis B infection. Hepatitis C Blood testing is recommended for: Everyone born from [birth year] through 1963. Anyone with known risk factors for hepatitis C. Sexually transmitted infections (STIs) You should be screened each year for STIs, including gonorrhea  and chlamydia, if: You  are sexually active and are younger than 68 years of age. You are older than 68 years of age and your health care provider tells you that you are at risk for this type of infection. Your sexual activity has changed since you were last screened, and you are at increased risk for chlamydia or gonorrhea. Ask your health care provider if you are at risk. Ask your health care provider about whether you are at high risk for HIV. Your health care provider may recommend a prescription medicine to help prevent HIV infection. If you choose to take medicine to prevent HIV, you should first get tested for HIV. You should then be tested every 3 months for as long as you are taking the medicine. Follow these instructions at home: Lifestyle Do not use any products that contain nicotine or tobacco, such as cigarettes, e-cigarettes, and chewing tobacco. If you need help quitting, ask your health care provider. Do not use street drugs. Do not share needles. Ask your health care provider for help if you need support or information about quitting drugs. Alcohol use Do not drink alcohol if your health care provider tells you not to drink. If you drink alcohol: Limit how much you have to 0-2 drinks a day. Be aware of how much alcohol is in your drink. In the [place] one drink equals one 12 oz bottle of beer (355 mL), one 5 oz glass of wine (148 mL), or one 1 oz glass of hard liquor (44 mL). General instructions Schedule regular health, dental, and eye exams. Stay current with your vaccines. Tell your health care provider if: You often feel depressed. You have ever been abused or do not feel safe at home. Summary Adopting a healthy lifestyle and getting preventive care are important in promoting health and wellness. Follow your health care provider's instructions about healthy diet, exercising, and getting tested or screened for diseases. Follow your health care provider's instructions on monitoring your cholesterol  and blood pressure. This information is not intended to replace advice given to you by your health care provider. Make sure you discuss any questions you have with your healthcare provider. Document Revised: 05/22/2018 Document Reviewed: 05/22/2018 Elsevier Patient Education  2022 Elsevier Inc.  Steps to Quit Smoking Smoking tobacco is the leading cause of preventable death. It can affect almost every organ in the body. Smoking puts you and people around you at risk for many serious, long-lasting (chronic) diseases. Quitting smoking can be hard, but it is one of the best things thatyou can do for your health. It is never too late to quit. How do I get ready to quit? When you decide to quit smoking, make a plan to help you succeed. Before you quit: Pick a date to quit. Set a date within the next 2 weeks to give you time to prepare. Write down the reasons why you are quitting. Keep this list in places where you will see it often. Tell your family, friends, and co-workers that you are quitting. Their support is important. Talk with your doctor about the choices that may help you quit. Find out if your health insurance will pay for these treatments. Know the people, places, things, and activities that make you want to smoke (triggers). Avoid them. What first steps can I take to quit smoking? Throw away all cigarettes at home, at work, and in your car. Throw away the things that you use when you smoke, such as ashtrays and lighters.  Clean your car. Make sure to empty the ashtray. Clean your home, including curtains and carpets. What can I do to help me quit smoking? Talk with your doctor about taking medicines and seeing a counselor at the same time. You are more likely to succeed when you do both. If you are pregnant or breastfeeding, talk with your doctor about counseling or other ways to quit smoking. Do not take medicine to help you quit smoking unless your doctor tells you to do so. To quit  smoking: Quit right away Quit smoking totally, instead of slowly cutting back on how much you smoke over a period of time. Go to counseling. You are more likely to quit if you go to counseling sessions regularly. Take medicine You may take medicines to help you quit. Some medicines need a prescription, and some you can buy over-the-counter. Some medicines may contain a drug called nicotine to replace the nicotine in cigarettes. Medicines may: Help you to stop having the desire to smoke (cravings). Help to stop the problems that come when you stop smoking (withdrawal symptoms). Your doctor may ask you to use: Nicotine patches, gum, or lozenges. Nicotine inhalers or sprays. Non-nicotine medicine that is taken by mouth. Find resources Find resources and other ways to help you quit smoking and remain smoke-free after you quit. These resources are most helpful when you use them often. They include: Online chats with a Veterinary surgeon. Phone quitlines. Printed Materials engineer. Support groups or group counseling. Text messaging programs. Mobile phone apps. Use apps on your mobile phone or tablet that can help you stick to your quit plan. There are many free apps for mobile phones and tablets as well as websites. Examples include Quit Guide from the [employer] and smokefree.gov  What things can I do to make it easier to quit?  Talk to your family and friends. Ask them to support and encourage you. Call a phone quitline (1-800-QUIT-NOW), reach out to support groups, or work with a Veterinary surgeon. Ask people who smoke to not smoke around you. Avoid places that make you want to smoke, such as: Bars. Parties. Smoke-break areas at work. Spend time with people who do not smoke. Lower the stress in your life. Stress can make you want to smoke. Try these things to help your stress: Getting regular exercise. Doing deep-breathing exercises. Doing yoga. Meditating. Doing a body scan. To do this, close your eyes,  focus on one area of your body at a time from head to toe. Notice which parts of your body are tense. Try to relax the muscles in those areas. How will I feel when I quit smoking? Day 1 to 3 weeks Within the first 24 hours, you may start to have some problems that come from quitting tobacco. These problems are very bad 2-3 days after you quit, but they do not often last for more than 2-3 weeks. You may get these symptoms: Mood swings. Feeling restless, nervous, angry, or annoyed. Trouble concentrating. Dizziness. Strong desire for high-sugar foods and nicotine. Weight gain. Trouble pooping (constipation). Feeling like you may vomit (nausea). Coughing or a sore throat. Changes in how the medicines that you take for other issues work in your body. Depression. Trouble sleeping (insomnia). Week 3 and afterward After the first 2-3 weeks of quitting, you may start to notice more positive results, such as: Better sense of smell and taste. Less coughing and sore throat. Slower heart rate. Lower blood pressure. Clearer skin. Better breathing. Fewer sick days. Quitting smoking  can be hard. Do not give up if you fail the first time. Some people need to try a few times before they succeed. Do your best to stick to your quit plan, and talk with yourdoctor if you have any questions or concerns. Summary Smoking tobacco is the leading cause of preventable death. Quitting smoking can be hard, but it is one of the best things that you can do for your health. When you decide to quit smoking, make a plan to help you succeed. Quit smoking right away, not slowly over a period of time. When you start quitting, seek help from your doctor, family, or friends. This information is not intended to replace advice given to you by your health care provider. Make sure you discuss any questions you have with your healthcare provider. Document Revised: 02/21/2019 Document Reviewed: 08/17/2018 Elsevier Patient  Education  2022 [employer].

## 2020-12-24 NOTE — Progress Notes (Signed)
Subjective:   Javier PriestlyJames Rios is a 70 y.o. male who presents for Medicare Annual/Subsequent preventive examination.  Review of Systems    Review of Systems  Constitutional: Negative.   HENT: Negative.    Eyes: Negative.   Respiratory: Negative.    Cardiovascular: Negative.   Gastrointestinal: Negative.   Genitourinary: Negative.   Musculoskeletal: Negative.   Skin: Negative.   Neurological: Negative.   Endo/Heme/Allergies: Negative.   Psychiatric/Behavioral: Negative.     Cardiac Risk Factors include: advanced age (>6055men, 29>65 women);hypertension;smoking/ tobacco exposure;male gender     Objective:    Today's Vitals   12/24/20 0815  BP: (!) 136/97  Pulse: 77  Resp: 18  Temp: 97.9 F (36.6 C)  SpO2: 100%  Weight: 143 lb (64.9 kg)  Height: 5\' 9"  (1.753 m)   Body mass index is 21.12 kg/m.  Advanced Directives 12/24/2020 07/15/2015 06/17/2015  Does Patient Have a Medical Advance Directive? No No No  Would patient like information on creating a medical advance directive? No - Patient declined - -    Current Medications (verified) Outpatient Encounter Medications as of 12/24/2020  Medication Sig   albuterol (PROAIR HFA) 108 (90 Base) MCG/ACT inhaler INHALE 2 PUFFS INTO THE LUNGS EVERY 6 (SIX) HOURS AS NEEDED FOR WHEEZING OR SHORTNESS OF BREATH.   amLODipine (NORVASC) 10 MG tablet Take 1 tablet (10 mg total) by mouth daily. Please make PCP appointment.   aspirin 81 MG tablet Take 81 mg by mouth daily.   carvedilol (COREG) 6.25 MG tablet Take 1 tablet (6.25 mg total) by mouth 2 (two) times daily.   chlorthalidone (HYGROTON) 25 MG tablet Take 1 tablet (25 mg total) by mouth daily.   nitroGLYCERIN (NITROSTAT) 0.4 MG SL tablet PLACE 1 TABLET UNDER THE TOUNGE EVERY 5MINS AS NEEDED FOR CHEST PAIN   pravastatin (PRAVACHOL) 40 MG tablet Take 1 tablet (40 mg total) by mouth every other day.   sildenafil (VIAGRA) 100 MG tablet TAKE 1 TABLET BY MOUTH DAILY AS NEEDED FOR ERECTILE  DYSFUNCTION   spironolactone (ALDACTONE) 50 MG tablet TAKE 1 TABLET BY MOUTH EVERY DAY   No facility-administered encounter medications on file as of 12/24/2020.    Allergies (verified) Rosuvastatin and Atorvastatin   History: Past Medical History:  Diagnosis Date   Chest pain    Coronary artery disease    Status post PTCA and stenting of his right coronary artery in March, 2008. He also has a 40-50% LAD stenosis   Hypertension    LAD stenosis    MODERATE BETWEEN 50-60%   MI, old 2008   INFERIOR WALL   Past Surgical History:  Procedure Laterality Date   CARDIAC CATHETERIZATION  08/23/2006   EF 65%. THERE IS INFEROBASILAR AKINESIS   CARDIOVASCULAR STRESS TEST  04/21/2010   EF 51%. NO SIGNIFICANT ST SEGMENT CHANGE SUGGESTIVE OF ISCHEMIA   CORONARY ANGIOPLASTY WITH STENT PLACEMENT     STENTING OF HIS RCA   Family History  Problem Relation Age of Onset   Hypertension Mother    Hypertension Brother    Hypertension Daughter    Social History   Socioeconomic History   Marital status: Married    Spouse name: Not on file   Number of children: Not on file   Years of education: Not on file   Highest education level: Not on file  Occupational History   Not on file  Tobacco Use   Smoking status: Every Day    Packs/day: 1.00    Types: Cigarettes  Smokeless tobacco: Never  Vaping Use   Vaping Use: Never used  Substance and Sexual Activity   Alcohol use: Yes    Comment: ocassion   Drug use: Yes    Types: Marijuana   Sexual activity: Yes  Other Topics Concern   Not on file  Social History Narrative   Not on file   Social Determinants of Health   Financial Resource Strain: Medium Risk   Difficulty of Paying Living Expenses: Somewhat hard  Food Insecurity: Food Insecurity Present   Worried About Programme researcher, broadcasting/film/video in the Last Year: Sometimes true   Ran Out of Food in the Last Year: Sometimes true  Transportation Needs: No Transportation Needs   Lack of  Transportation (Medical): No   Lack of Transportation (Non-Medical): No  Physical Activity: Insufficiently Active   Days of Exercise per Week: 3 days   Minutes of Exercise per Session: 30 min  Stress: No Stress Concern Present   Feeling of Stress : Not at all  Social Connections: Moderately Integrated   Frequency of Communication with Friends and Family: More than three times a week   Frequency of Social Gatherings with Friends and Family: Once a week   Attends Religious Services: 1 to 4 times per year   Active Member of [employer] or Organizations: Yes   Attends Banker Meetings: 1 to 4 times per year   Marital Status: Divorced    Tobacco Counseling Ready to quit: Not Answered Counseling given: Yes   Clinical Intake:  Pre-visit preparation completed: No  Pain : No/denies pain     BMI - recorded: 21.12 Nutritional Status: BMI of 19-24  Normal Nutritional Risks: None Diabetes: No  What is the last grade level you completed in school?: 11th grade  Diabetic?no  Interpreter Needed?: No      Activities of Daily Living In your present state of health, do you have any difficulty performing the following activities: 12/24/2020  Hearing? N  Vision? N  Difficulty concentrating or making decisions? N  Walking or climbing stairs? Y  Dressing or bathing? N  Doing errands, shopping? N  Preparing Food and eating ? N  Using the Toilet? N  In the past six months, have you accidently leaked urine? N  Do you have problems with loss of bowel control? N  Managing your Medications? N  Managing your Finances? N  Housekeeping or managing your Housekeeping? N  Some recent data might be hidden    Patient Care Team: Claiborne Rigg, NP as PCP - General (Nurse Practitioner) Nahser, Deloris Ping, MD as PCP - Cardiology (Cardiology)  Indicate any recent Medical Services you may have received from other than [facility] providers in the past year (date may be approximate).      Assessment:   This is a routine wellness examination for Javier Rios.  Hearing/Vision screen No results found.  Dietary issues and exercise activities discussed: Current Exercise Habits: The patient does not participate in regular exercise at present, Exercise limited by: None identified   Goals Addressed   None    Depression Screen PHQ 2/9 Scores 12/24/2020 08/27/2020 12/12/2019  PHQ - 2 Score 0 0 0  PHQ- 9 Score - - 0    Fall Risk Fall Risk  12/24/2020 08/27/2020  Falls in the past year? 0 0  Number falls in past yr: 0 0  Injury with Fall? 0 0  Risk for fall due to : No Fall Risks -  Follow up Education provided -  FALL RISK PREVENTION PERTAINING TO THE HOME:  Any stairs in or around the home? No  If so, are there any without handrails? No  Home free of loose throw rugs in walkways, pet beds, electrical cords, etc? No  Adequate lighting in your home to reduce risk of falls? Yes   ASSISTIVE DEVICES UTILIZED TO PREVENT FALLS:  Life alert? No  Use of a cane, walker or w/c? No  Grab bars in the bathroom? No  Shower chair or bench in shower? No  Elevated toilet seat or a handicapped toilet? No   TIMED UP AND GO:  Was the test performed? Yes .  Length of time to ambulate 10 feet: 3 sec.   Gait steady and fast without use of assistive device  Cognitive Function: MMSE - Mini Mental State Exam 12/24/2020  Orientation to time 5  Orientation to Place 5  Registration 3  Attention/ Calculation 5  Recall 3  Language- name 2 objects 2  Language- repeat 1  Language- follow 3 step command 3  Language- read & follow direction 1  Write a sentence 1  Copy design 1  Total score 30        Immunizations  There is no immunization history on file for this patient.  TDAP status: Due, Education has been provided regarding the importance of this vaccine. Advised may receive this vaccine at local pharmacy or Health Dept. Aware to provide a copy of the vaccination record if obtained  from local pharmacy or Health Dept. Verbalized acceptance and understanding.  Flu Vaccine status: Due, Education has been provided regarding the importance of this vaccine. Advised may receive this vaccine at local pharmacy or Health Dept. Aware to provide a copy of the vaccination record if obtained from local pharmacy or Health Dept. Verbalized acceptance and understanding.  Pneumococcal vaccine status: Due, Education has been provided regarding the importance of this vaccine. Advised may receive this vaccine at local pharmacy or Health Dept. Aware to provide a copy of the vaccination record if obtained from local pharmacy or Health Dept. Verbalized acceptance and understanding.  Covid-19 vaccine status: Declined, Education has been provided regarding the importance of this vaccine but patient still declined. Advised may receive this vaccine at local pharmacy or Health Dept.or vaccine clinic. Aware to provide a copy of the vaccination record if obtained from local pharmacy or Health Dept. Verbalized acceptance and understanding.  Qualifies for Shingles Vaccine? No   Zostavax completed No   Shingrix Completed?: No.    Education has been provided regarding the importance of this vaccine. Patient has been advised to call insurance company to determine out of pocket expense if they have not yet received this vaccine. Advised may also receive vaccine at local pharmacy or Health Dept. Verbalized acceptance and understanding.  Screening Tests Health Maintenance  Topic Date Due   COVID-19 Vaccine (1) Never done   TETANUS/TDAP  Never done   Zoster Vaccines- Shingrix (1 of 2) Never done   PNA vac Low Risk Adult (1 of 2 - PCV13) Never done   INFLUENZA VACCINE  01/10/2021   Hepatitis C Screening  Completed   HPV VACCINES  Aged Out   COLONOSCOPY (Pts 45-60yrs Insurance coverage will need to be confirmed)  Discontinued    Health Maintenance  Health Maintenance Due  Topic Date Due   COVID-19 Vaccine  (1) Never done   TETANUS/TDAP  Never done   Zoster Vaccines- Shingrix (1 of 2) Never done   PNA vac Low Risk  Adult (1 of 2 - PCV13) Never done    Colorectal cancer screening: Type of screening: Cologuard. Completed  . Repeat every 1 years  Lung Cancer Screening: (Low Dose CT Chest recommended if Age 59-80 years, 30 pack-year currently smoking OR have quit w/in 15years.) does qualify.   Lung Cancer Screening Referral: referral placed  Additional Screening:  Hepatitis C Screening: does qualify; Completed   Vision Screening: Recommended annual ophthalmology exams for early detection of glaucoma and other disorders of the eye. Is the patient up to date with their annual eye exam?  No  Who is the provider or what is the name of the office in which the patient attends annual eye exams?  If pt is not established with a provider, would they like to be referred to a provider to establish care? No .   Dental Screening: Recommended annual dental exams for proper oral hygiene  Community Resource Referral / Chronic Care Management: CRR required this visit?  No   CCM required this visit?  No      Plan:     I have personally reviewed and noted the following in the patient's chart:   Medical and social history Use of alcohol, tobacco or illicit drugs  Current medications and supplements including opioid prescriptions. Patient is not currently taking opioid prescriptions. Functional ability and status Nutritional status Physical activity Advanced directives List of other physicians Hospitalizations, surgeries, and ER visits in previous 12 months Vitals Screenings to include cognitive, depression, and falls Referrals and appointments  In addition, I have reviewed and discussed with patient certain preventive protocols, quality metrics, and best practice recommendations. A written personalized care plan for preventive services as well as general preventive health recommendations were  provided to patient.     Ivonne Andrew, NP   12/24/2020

## 2021-01-03 ENCOUNTER — Other Ambulatory Visit: Payer: Self-pay | Admitting: Family Medicine

## 2021-01-03 DIAGNOSIS — I10 Essential (primary) hypertension: Secondary | ICD-10-CM

## 2021-01-04 ENCOUNTER — Other Ambulatory Visit: Payer: Self-pay | Admitting: Nurse Practitioner

## 2021-01-04 DIAGNOSIS — N529 Male erectile dysfunction, unspecified: Secondary | ICD-10-CM

## 2021-01-04 NOTE — Telephone Encounter (Signed)
Requested medication (s) are due for refill today - expired Rx  Requested medication (s) are on the active medication list -yes  Future visit scheduled -no  Last refill: 12/12/19 #10 5RF  Notes to clinic: Request RF- expired Rx  Requested Prescriptions  Pending Prescriptions Disp Refills   sildenafil (VIAGRA) 100 MG tablet [Pharmacy Med Name: SILDENAFIL 100 MG TABLET] 6 tablet 9    Sig: TAKE 1 TABLET BY MOUTH DAILY AS NEEDED FOR ERECTILE DYSFUNCTION      Urology: Erectile Dysfunction Agents Failed - 01/04/2021  2:20 PM      Failed - Last BP in normal range    BP Readings from Last 1 Encounters:  12/24/20 (!) 136/97          Passed - Valid encounter within last 12 months    Recent Outpatient Visits           4 months ago Primary hypertension   [facility] [place], Shea Stakes, NP   6 months ago Essential hypertension   [facility] [place], Cornelius Moras, RPH-CPP   10 months ago Essential hypertension   [facility] [place], Cornelius Moras, RPH-CPP   11 months ago Essential hypertension   [facility] [place], Cornelius Moras, RPH-CPP   12 months ago Essential hypertension   [facility] [place], Shea Stakes, NP                    Requested Prescriptions  Pending Prescriptions Disp Refills   sildenafil (VIAGRA) 100 MG tablet [Pharmacy Med Name: SILDENAFIL 100 MG TABLET] 6 tablet 9    Sig: TAKE 1 TABLET BY MOUTH DAILY AS NEEDED FOR ERECTILE DYSFUNCTION      Urology: Erectile Dysfunction Agents Failed - 01/04/2021  2:20 PM      Failed - Last BP in normal range    BP Readings from Last 1 Encounters:  12/24/20 (!) 136/97          Passed - Valid encounter within last 12 months    Recent Outpatient Visits           4 months ago Primary hypertension   [facility] [place], Shea Stakes, NP   6 months ago  Essential hypertension   [facility] [place], Cornelius Moras, RPH-CPP   10 months ago Essential hypertension   [facility] [place], Cornelius Moras, RPH-CPP   11 months ago Essential hypertension   [facility] [place], Cornelius Moras, RPH-CPP   12 months ago Essential hypertension   [facility] [place], Shea Stakes, NP

## 2021-01-07 ENCOUNTER — Other Ambulatory Visit: Payer: Self-pay | Admitting: Nurse Practitioner

## 2021-01-07 DIAGNOSIS — N529 Male erectile dysfunction, unspecified: Secondary | ICD-10-CM

## 2021-01-07 MED ORDER — SILDENAFIL CITRATE 100 MG PO TABS: ORAL_TABLET | ORAL | 1 refills | Status: DC

## 2021-01-07 NOTE — Telephone Encounter (Signed)
   Notes to clinic:  Patient has been scheduled for 03/02/2021 Would like refill until that time Patient states that he is supposed to be getting 10 tabs a month and only getting 4    Requested Prescriptions  Pending Prescriptions Disp Refills   sildenafil (VIAGRA) 100 MG tablet 10 tablet 5    Sig: TAKE 1 TABLET BY MOUTH DAILY AS NEEDED FOR ERECTILE DYSFUNCTION      Urology: Erectile Dysfunction Agents Failed - 01/07/2021  9:34 AM      Failed - Last BP in normal range    BP Readings from Last 1 Encounters:  12/24/20 (!) 136/97          Passed - Valid encounter within last 12 months    Recent Outpatient Visits           4 months ago Primary hypertension   [facility] [place], Shea Stakes, NP   6 months ago Essential hypertension   [facility] [place], Cornelius Moras, RPH-CPP   10 months ago Essential hypertension   [facility] [place], Cornelius Moras, RPH-CPP   11 months ago Essential hypertension   [facility] [place], Cornelius Moras, RPH-CPP   12 months ago Essential hypertension   [facility] [place], Shea Stakes, NP       Future Appointments             In 1 month Claiborne Rigg, NP [facility]

## 2021-01-07 NOTE — Telephone Encounter (Signed)
Copied from CRM [id number]. Topic: Quick Communication - Rx Refill/Question >> Jan 07, 2021  9:31 AM Marylen Ponto wrote: Medication: sildenafil (VIAGRA) 100 MG tablet  Has the patient contacted their pharmacy? Yes.   (Agent: If no, request that the patient contact the pharmacy for the refill.) (Agent: If yes, when and what did the pharmacy advise?)  Preferred Pharmacy (with phone number or street name): [id number] [place], [place] - [address]  Phone: [phone]  Fax: [phone]  Agent: Please be advised that RX refills may take up to 3 business days. We ask that you follow-up with your pharmacy.

## 2021-03-02 ENCOUNTER — Encounter: Payer: Self-pay | Admitting: Nurse Practitioner

## 2021-03-02 ENCOUNTER — Other Ambulatory Visit: Payer: Self-pay

## 2021-03-02 ENCOUNTER — Ambulatory Visit: Payer: Medicare (Managed Care) | Attending: Nurse Practitioner | Admitting: Nurse Practitioner

## 2021-03-02 VITALS — BP 151/94 | HR 71 | Ht 69.0 in | Wt 145.1 lb

## 2021-03-02 DIAGNOSIS — R7303 Prediabetes: Secondary | ICD-10-CM | POA: Diagnosis not present

## 2021-03-02 DIAGNOSIS — M25512 Pain in left shoulder: Secondary | ICD-10-CM | POA: Diagnosis not present

## 2021-03-02 DIAGNOSIS — N529 Male erectile dysfunction, unspecified: Secondary | ICD-10-CM

## 2021-03-02 DIAGNOSIS — E782 Mixed hyperlipidemia: Secondary | ICD-10-CM

## 2021-03-02 DIAGNOSIS — I10 Essential (primary) hypertension: Secondary | ICD-10-CM

## 2021-03-02 DIAGNOSIS — G8929 Other chronic pain: Secondary | ICD-10-CM

## 2021-03-02 MED ORDER — PREDNISONE 10 MG PO TABS
10.0000 mg | ORAL_TABLET | Freq: Every day | ORAL | 0 refills | Status: AC
Start: 2021-03-02 — End: 2021-03-07

## 2021-03-02 MED ORDER — SILDENAFIL CITRATE 100 MG PO TABS: ORAL_TABLET | ORAL | 3 refills | Status: DC

## 2021-03-02 MED ORDER — CARVEDILOL 6.25 MG PO TABS: 6.2500 mg | ORAL_TABLET | Freq: Two times a day (BID) | ORAL | 1 refills | Status: DC

## 2021-03-02 MED ORDER — EZETIMIBE 10 MG PO TABS: 10.0000 mg | ORAL_TABLET | Freq: Every day | ORAL | 3 refills | Status: DC

## 2021-03-02 NOTE — Progress Notes (Signed)
Assessment & Plan:  Javier Rios was seen today for hypertension.  Diagnoses and all orders for this visit:  Primary hypertension -     CMP14+EGFR -     carvedilol (COREG) 6.25 MG tablet; Take 1 tablet (6.25 mg total) by mouth 2 (two) times daily. Continue all antihypertensives as prescribed.  Remember to bring in your blood pressure log with you for your follow up appointment.  DASH/Mediterranean Diets are healthier choices for HTN.    Erectile dysfunction, unspecified erectile dysfunction type -     sildenafil (VIAGRA) 100 MG tablet; TAKE 1 TABLET BY MOUTH DAILY AS NEEDED FOR ERECTILE DYSFUNCTION  Prediabetes -     Hemoglobin A1c Continue blood sugar control as discussed in office today, low carbohydrate diet, and regular physical exercise as tolerated, 150 minutes per week (30 min each day, 5 days per week, or 50 min 3 days per week).   Chronic left shoulder pain -     predniSONE (DELTASONE) 10 MG tablet; Take 1 tablet (10 mg total) by mouth daily with breakfast for 5 days.  Mixed hyperlipidemia -     ezetimibe (ZETIA) 10 MG tablet; Take 1 tablet (10 mg total) by mouth daily. INSTRUCTIONS: Work on a low fat, heart healthy diet and participate in regular aerobic exercise program by working out at least 150 minutes per week; 5 days a week-30 minutes per day. Avoid red meat/beef/steak,  fried foods. junk foods, sodas, sugary drinks, unhealthy snacking, alcohol and smoking.  Drink at least 80 oz of water per day and monitor your carbohydrate intake daily.     Patient has been counseled on age-appropriate routine health concerns for screening and prevention. These are reviewed and up-to-date. Referrals have been placed accordingly. Immunizations are up-to-date or declined.    Subjective:   Chief Complaint  Patient presents with   Hypertension   HPI Javier Rios 70 y.o. male presents to office today with complaints of left shoulder pain. He has a past medical history of Chest pain,  Coronary artery disease, Hypertension, LAD stenosis, and MI, old (2008).    HTN Blood pressure at home 120-130/80s.  Currently taking carvedilol 6.25 mg daily, chlorthalidone 25 mg daily and spironolactone 50 mg daily. Denies chest pain, shortness of breath, palpitations, lightheadedness, dizziness, headaches or BLE edema.  As he reports home readings in normal limits will not make any changes with medications today. He does continue to smoke cigarettes.  BP Readings from Last 3 Encounters:  03/02/21 (!) 151/94  12/24/20 (!) 136/97  08/27/20 135/79    Dyslipidemia Could not tolerate atorvastatin, rosuvastatin and now pravasatin. He was instructed to try and take pravastatin every other day and continues to endorse myalgias. He will need to be evaluated at the lipid clinic through cardiology  Left Shoulder pain Denies any injury or trauma. States pain comes and goes. Denies any cardiac or chest pain. Pain is chronic. Aggravating factors: Lifting and raising the arm.   Prediabetes Well controlled. He is not taking any diabetic medications at this time.  Lab Results  Component Value Date   HGBA1C 6.0 (H) 03/02/2021    Review of Systems  Constitutional:  Negative for fever, malaise/fatigue and weight loss.  HENT: Negative.  Negative for nosebleeds.   Eyes: Negative.  Negative for blurred vision, double vision and photophobia.  Respiratory: Negative.  Negative for cough and shortness of breath.   Cardiovascular: Negative.  Negative for chest pain, palpitations and leg swelling.  Gastrointestinal: Negative.  Negative for heartburn,  nausea and vomiting.  Genitourinary:        ED  Musculoskeletal:  Positive for joint pain. Negative for myalgias.  Neurological: Negative.  Negative for dizziness, focal weakness, seizures and headaches.  Psychiatric/Behavioral: Negative.  Negative for suicidal ideas.    Past Medical History:  Diagnosis Date   Chest pain    Coronary artery disease     Status post PTCA and stenting of his right coronary artery in March, 2008. 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 THERE IS INFEROBASILAR AKINESIS   CARDIOVASCULAR STRESS TEST  04/21/2010   EF 51%. NO SIGNIFICANT ST SEGMENT CHANGE SUGGESTIVE OF ISCHEMIA   CORONARY ANGIOPLASTY WITH STENT PLACEMENT     STENTING OF HIS RCA    Family History  Problem Relation Age of Onset   Hypertension Mother    Hypertension Brother    Hypertension Daughter     Social History Reviewed with no changes to be made today.   Outpatient Medications Prior to Visit  Medication Sig Dispense Refill   albuterol (PROAIR HFA) 108 (90 Base) MCG/ACT inhaler INHALE 2 PUFFS INTO THE LUNGS EVERY 6 (SIX) HOURS AS NEEDED FOR WHEEZING OR SHORTNESS OF BREATH. 1 Inhaler 3   amLODipine (NORVASC) 10 MG tablet TAKE 1 TABLET (10 MG TOTAL) BY MOUTH DAILY. PLEASE MAKE PCP APPOINTMENT. 90 tablet 1   aspirin 81 MG tablet Take 81 mg by mouth daily.     chlorthalidone (HYGROTON) 25 MG tablet TAKE 1 TABLET (25 MG TOTAL) BY MOUTH DAILY. 90 tablet 1   nitroGLYCERIN (NITROSTAT) 0.4 MG SL tablet PLACE 1 TABLET UNDER THE TOUNGE EVERY 5MINS AS NEEDED FOR CHEST PAIN 25 tablet 3   spironolactone (ALDACTONE) 50 MG tablet TAKE 1 TABLET BY MOUTH EVERY DAY 90 tablet 1   carvedilol (COREG) 6.25 MG tablet Take 1 tablet (6.25 mg total) by mouth 2 (two) times daily. 180 tablet 1   sildenafil (VIAGRA) 100 MG tablet TAKE 1 TABLET BY MOUTH DAILY AS NEEDED FOR ERECTILE DYSFUNCTION 10 tablet 1   pravastatin (PRAVACHOL) 40 MG tablet Take 1 tablet (40 mg total) by mouth every other day. 90 tablet 3   No facility-administered medications prior to visit.    Allergies  Allergen Reactions   Rosuvastatin Other (See Comments)    Muscle aches    Atorvastatin     Muscle  aches       Objective:    BP (!) 151/94   Pulse 71   Ht '5\' 9"'  (1.753 m)   Wt 145 lb 2 oz (65.8 kg)   SpO2 100%   BMI 21.43 kg/m  Wt Readings from Last 3 Encounters:  03/02/21 145 lb 2 oz (65.8 kg)  12/24/20 143 lb (64.9 kg)  08/27/20 148 lb 9.6 oz (67.4 kg)    Physical Exam Vitals and nursing note reviewed.  Constitutional:      Appearance: He is well-developed.  HENT:     Head: Normocephalic and atraumatic.  Cardiovascular:     Rate and Rhythm: Normal rate and regular rhythm.     Heart sounds: Normal heart sounds. No murmur heard.   No friction rub. No gallop.  Pulmonary:     Effort: Pulmonary effort is normal. No tachypnea or respiratory distress.  Breath sounds: Normal breath sounds. No decreased breath sounds, wheezing, rhonchi or rales.  Chest:     Chest wall: No tenderness.  Abdominal:     General: Bowel sounds are normal.     Palpations: Abdomen is soft.  Musculoskeletal:     Left shoulder: Tenderness present. No swelling or deformity. Decreased range of motion.     Cervical back: Normal range of motion.  Skin:    General: Skin is warm and dry.  Neurological:     Mental Status: He is alert and oriented to person, place, and time.     Coordination: Coordination normal.  Psychiatric:        Behavior: Behavior normal. Behavior is cooperative.        Thought Content: Thought content normal.        Judgment: Judgment normal.         Patient has been counseled extensively about nutrition and exercise as well as the importance of adherence with medications and regular follow-up. The patient was given clear instructions to go to ER or return to medical center if symptoms don't improve, worsen or new problems develop. The patient verbalized understanding.   Follow-up: Return in about 3 months (around 06/01/2021).   Gildardo Pounds, FNP-BC [facility] [place], [place]   03/06/2021, 12:29 PM

## 2021-03-02 NOTE — Patient Instructions (Signed)
FOLLOW UP WITH DR [facility] FOR LIPID CLINIC

## 2021-03-03 LAB — CMP14+EGFR
ALT: 20 IU/L (ref 0–44)
AST: 24 IU/L (ref 0–40)
Albumin/Globulin Ratio: 2 (ref 1.2–2.2)
Albumin: 5.1 g/dL — ABNORMAL HIGH (ref 3.8–4.8)
Alkaline Phosphatase: 77 IU/L (ref 44–121)
BUN/Creatinine Ratio: 13 (ref 10–24)
BUN: 15 mg/dL (ref 8–27)
Bilirubin Total: 0.6 mg/dL (ref 0.0–1.2)
CO2: 24 mmol/L (ref 20–29)
Calcium: 10.4 mg/dL — ABNORMAL HIGH (ref 8.6–10.2)
Chloride: 98 mmol/L (ref 96–106)
Creatinine, Ser: 1.14 mg/dL (ref 0.76–1.27)
Globulin, Total: 2.6 g/dL (ref 1.5–4.5)
Glucose: 106 mg/dL — ABNORMAL HIGH (ref 65–99)
Potassium: 4.2 mmol/L (ref 3.5–5.2)
Sodium: 137 mmol/L (ref 134–144)
Total Protein: 7.7 g/dL (ref 6.0–8.5)
eGFR: 70 mL/min/{1.73_m2} (ref >59)

## 2021-03-03 LAB — HEMOGLOBIN A1C
Est. average glucose Bld gHb Est-mCnc: 126 mg/dL
Hgb A1c MFr Bld: 6 % — ABNORMAL HIGH (ref 4.8–5.6)

## 2021-03-06 ENCOUNTER — Encounter: Payer: Self-pay | Admitting: Nurse Practitioner

## 2021-04-12 ENCOUNTER — Ambulatory Visit (HOSPITAL_COMMUNITY)
Admission: RE | Admit: 2021-04-12 | Discharge: 2021-04-12 | Disposition: A | Discharge status: Home / Self Care | Payer: Medicare (Managed Care) | Source: Ambulatory Visit | Attending: Physician Assistant | Admitting: Physician Assistant

## 2021-04-12 ENCOUNTER — Ambulatory Visit: Payer: Medicare (Managed Care) | Attending: Physician Assistant | Admitting: Physician Assistant

## 2021-04-12 ENCOUNTER — Other Ambulatory Visit: Payer: Self-pay

## 2021-04-12 ENCOUNTER — Encounter: Payer: Self-pay | Admitting: Physician Assistant

## 2021-04-12 VITALS — BP 160/93 | HR 85 | Temp 98.7°F | Resp 18 | Ht 69.5 in | Wt 149.0 lb

## 2021-04-12 DIAGNOSIS — G8929 Other chronic pain: Secondary | ICD-10-CM | POA: Insufficient documentation

## 2021-04-12 DIAGNOSIS — M25512 Pain in left shoulder: Secondary | ICD-10-CM | POA: Diagnosis present

## 2021-04-12 DIAGNOSIS — I10 Essential (primary) hypertension: Secondary | ICD-10-CM

## 2021-04-12 MED ORDER — PREDNISONE 10 MG PO TABS: ORAL_TABLET | ORAL | 0 refills | Status: AC

## 2021-04-12 NOTE — Patient Instructions (Signed)
To help with your shoulder pain, you are going to do a steroid taper.  You can also use ibuprofen over-the-counter and icing may also offer some relief.  We will call you with the results of your x-ray.  Please let us know if there is anything else we can do for you.  Roney Jaffe, PA-C Physician Assistant [facility] [email or web address]   Tendinitis Tendinitis is inflammation of a tendon. A tendon is a strong cord of tissue that connects muscle to bone. Tendinitis can affect any tendon, but it most commonly affects the: Shoulder tendon (rotator cuff). Ankle tendon (Achilles tendon). Elbow tendon (triceps tendon). Tendons in the wrist. What are the causes? This condition may be caused by: Overusing a tendon or muscle. This is common. Age-related wear and tear. Injury. Inflammatory conditions, such as arthritis. Certain medicines. What increases the risk? You are more likely to develop this condition if you do activities that involve the same movements over and over again (repetitive motions). What are the signs or symptoms? Symptoms of this condition may include: Pain. Tenderness. Mild swelling. Decreased range of motion. How is this diagnosed? This condition is diagnosed with a physical exam. You may also have tests, such as: Ultrasound. This uses sound waves to make an image of the inside of your body in the affected area. MRI. How is this treated? This condition may be treated by resting, icing, applying pressure (compression), and raising (elevating) the affected area above the level of your heart. This is known as RICE therapy. Treatment may also include: Medicines to help reduce inflammation or to help reduce pain. Exercises or physical therapy to strengthen and stretch the tendon. A brace or splint. Surgery. This is rarely needed. Follow these instructions at home: If you have a splint or brace: Wear the  splint or brace as told by your health care provider. Remove it only as told by your health care provider. Loosen the splint or brace if your fingers or toes tingle, become numb, or turn cold and blue. Keep the splint or brace clean. If the splint or brace is not waterproof: Do not let it get wet. Cover it with a watertight covering when you take a bath or shower. Managing pain, stiffness, and swelling If directed, put ice on the affected area. If you have a removable splint or brace, remove it as told by your health care provider. Put ice in a plastic bag. Place a towel between your skin and the bag. Leave the ice on for 20 minutes, 2-3 times a day. Move the fingers or toes of the affected limb often, if this applies. This can help to prevent stiffness and lessen swelling. If directed, raise (elevate) the affected area above the level of your heart while you are sitting or lying down. If directed, apply heat to the affected area before you exercise. Use the heat source that your health care provider recommends, such as a moist heat pack or a heating pad.   Place a towel between your skin and the heat source. Leave the heat on for 20-30 minutes. Remove the heat if your skin turns bright red. This is especially important if you are unable to feel pain, heat, or cold. You may have a greater risk of getting burned. Driving Do not drive or use heavy machinery while taking prescription pain medicine. Ask your health care provider when it is safe to drive if you have a splint or brace on any part of your arm  or leg. Activity Rest the affected area as told by your health care provider. Return to your normal activities as told by your health care provider. Ask your health care provider what activities are safe for you. Avoid using the affected area while you are experiencing symptoms of tendinitis. Do exercises as told by your health care provider. General instructions If you have a splint, do  not put pressure on any part of the splint until it is fully hardened. This may take several hours. Wear an elastic bandage or compression wrap only as told by your health care provider. Take over-the-counter and prescription medicines only as told by your health care provider. Keep all follow-up visits as told by your health care provider. This is important. Contact a health care provider if: Your symptoms do not improve. You develop new, unexplained problems, such as numbness in your hands. Summary Tendinitis is inflammation of a tendon. You are more likely to develop this condition if you do activities that involve the same movements over and over again. This condition may be treated by resting, icing, applying pressure (compression), and elevating the area above the level of your heart. This is known as RICE therapy. Avoid using the affected area while you are experiencing symptoms of tendinitis. This information is not intended to replace advice given to you by your health care provider. Make sure you discuss any questions you have with your health care provider. Document Revised: 12/04/2017 Document Reviewed: 10/17/2017 Elsevier Patient Education  2022 [employer].

## 2021-04-12 NOTE — Progress Notes (Signed)
Established Patient Office Visit  Subjective:  Patient ID: Javier Rios, male    DOB: 1951-05-01  Age: 70 y.o. MRN: [id number]  CC:  Chief Complaint  Patient presents with   Shoulder Pain    HPI Javier Rios reports that he has been having left shoulder pain for the past 2 months.  Describes pain as tenderness, worse when he is holding something heavy or if he is putting pressure on his shoulder.  Denies numbness or tingling, occasional pain with range of motion.  Denies injury or trauma, does states that he carries his 50 to 60 pound grandson in his left arm frequently.  Reports that he did try Tylenol with a little relief, ibuprofen with more relief.  Reports that he does check his blood pressure at home on a daily basis, states readings at home are within normal limits.  Denies any hypertensive symptoms.   Past Medical History:  Diagnosis Date   Chest pain    Coronary artery disease    Status post PTCA and stenting of his right coronary artery in March, 2008. 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 THERE IS INFEROBASILAR AKINESIS   CARDIOVASCULAR STRESS TEST  04/21/2010   EF 51%. NO SIGNIFICANT ST SEGMENT CHANGE SUGGESTIVE OF ISCHEMIA   CORONARY ANGIOPLASTY WITH STENT PLACEMENT     STENTING OF HIS RCA    Family History  Problem Relation Age of Onset   Hypertension Mother    Hypertension Brother    Hypertension Daughter     Social History   Socioeconomic History   Marital status: Married    Spouse name: Not on file   Number of children: Not on file   Years of education: Not on file   Highest education level: Not on file  Occupational History   Not on file  Tobacco Use   Smoking status: Every Day    Packs/day: 1.00    Types: Cigarettes   Smokeless tobacco: Never  Vaping  Use   Vaping Use: Never used  Substance and Sexual Activity   Alcohol use: Yes    Comment: ocassion   Drug use: Yes    Types: Marijuana   Sexual activity: Yes  Other Topics Concern   Not on file  Social History Narrative   Not on file   Social Determinants of Health   Financial Resource Strain: Medium Risk   Difficulty of Paying Living Expenses: Somewhat hard  Food Insecurity: Food Insecurity Present   Worried About Running Out of Food in the Last Year: Sometimes true   Ran Out of Food in the Last Year: Sometimes true  Transportation Needs: No Transportation Needs   Lack of Transportation (Medical): No   Lack of Transportation (Non-Medical): No  Physical Activity: Insufficiently Active   Days of Exercise per Week: 3 days   Minutes of Exercise per Session: 30 min  Stress: No Stress Concern Present   Feeling of Stress : Not at all  Social Connections: Moderately Integrated   Frequency of Communication with Friends and Family: More than three times a week   Frequency of Social Gatherings with Friends and Family: Once a week   Attends Religious Services: 1 to 4 times per year   Active Member of Clubs or  Organizations: Yes   Attends Music therapist: 1 to 4 times per year   Marital Status: Divorced  Human resources officer Violence: Not At Risk   Fear of Current or Ex-Partner: No   Emotionally Abused: No   Physically Abused: No   Sexually Abused: No    Outpatient Medications Prior to Visit  Medication Sig Dispense Refill   albuterol (PROAIR HFA) 108 (90 Base) MCG/ACT inhaler INHALE 2 PUFFS INTO THE LUNGS EVERY 6 (SIX) HOURS AS NEEDED FOR WHEEZING OR SHORTNESS OF BREATH. 1 Inhaler 3   amLODipine (NORVASC) 10 MG tablet TAKE 1 TABLET (10 MG TOTAL) BY MOUTH DAILY. PLEASE MAKE PCP APPOINTMENT. 90 tablet 1   aspirin 81 MG tablet Take 81 mg by mouth daily.     carvedilol (COREG) 6.25 MG tablet Take 1 tablet (6.25 mg total) by mouth 2 (two) times daily. 180 tablet 1    chlorthalidone (HYGROTON) 25 MG tablet TAKE 1 TABLET (25 MG TOTAL) BY MOUTH DAILY. 90 tablet 1   ezetimibe (ZETIA) 10 MG tablet Take 1 tablet (10 mg total) by mouth daily. 90 tablet 3   nitroGLYCERIN (NITROSTAT) 0.4 MG SL tablet PLACE 1 TABLET UNDER THE TOUNGE EVERY 5MINS AS NEEDED FOR CHEST PAIN 25 tablet 3   sildenafil (VIAGRA) 100 MG tablet TAKE 1 TABLET BY MOUTH DAILY AS NEEDED FOR ERECTILE DYSFUNCTION 10 tablet 3   spironolactone (ALDACTONE) 50 MG tablet TAKE 1 TABLET BY MOUTH EVERY DAY 90 tablet 1   No facility-administered medications prior to visit.    Allergies  Allergen Reactions   Rosuvastatin Other (See Comments)    Muscle aches    Atorvastatin     Muscle aches    ROS Review of Systems  Constitutional: Negative.   HENT: Negative.    Eyes: Negative.   Respiratory:  Negative for shortness of breath.   Cardiovascular:  Negative for chest pain.  Gastrointestinal: Negative.   Endocrine: Negative.   Genitourinary: Negative.   Musculoskeletal:  Positive for arthralgias. Negative for neck pain and neck stiffness.  Skin: Negative.   Allergic/Immunologic: Negative.   Neurological: Negative.   Hematological: Negative.   Psychiatric/Behavioral: Negative.       Objective:    Physical Exam Vitals and nursing note reviewed.  Constitutional:      Appearance: Normal appearance.  HENT:     Head: Normocephalic and atraumatic.     Right Ear: External ear normal.     Left Ear: External ear normal.     Nose: Nose normal.     Mouth/Throat:     Mouth: Mucous membranes are moist.     Pharynx: Oropharynx is clear.  Eyes:     Extraocular Movements: Extraocular movements intact.     Conjunctiva/sclera: Conjunctivae normal.     Pupils: Pupils are equal, round, and reactive to light.  Cardiovascular:     Rate and Rhythm: Normal rate and regular rhythm.     Pulses: Normal pulses.     Heart sounds: Normal heart sounds.  Pulmonary:     Effort: Pulmonary effort is normal.      Breath sounds: Normal breath sounds.  Musculoskeletal:     Right shoulder: Normal.     Left shoulder: Bony tenderness present. No swelling or crepitus. Decreased range of motion.     Right upper arm: Normal.     Left upper arm: Normal.     Right elbow: Normal.     Left elbow: Normal.     Cervical back: Normal range  of motion and neck supple.  Skin:    General: Skin is warm and dry.  Neurological:     General: No focal deficit present.     Mental Status: He is alert and oriented to person, place, and time.  Psychiatric:        Mood and Affect: Mood normal.        Behavior: Behavior normal.        Thought Content: Thought content normal.        Judgment: Judgment normal.    BP (!) 160/93 (BP Location: Left Arm, Patient Position: Sitting, Cuff Size: Normal)   Pulse 85   Temp 98.7 F (37.1 C) (Oral)   Resp 18   Ht 5' 9.5" (1.765 m)   Wt 149 lb (67.6 kg)   SpO2 98%   BMI 21.69 kg/m  Wt Readings from Last 3 Encounters:  04/12/21 149 lb (67.6 kg)  03/02/21 145 lb 2 oz (65.8 kg)  12/24/20 143 lb (64.9 kg)     Health Maintenance Due  Topic Date Due   COVID-19 Vaccine (1) Never done   Pneumonia Vaccine 86+ Years old (1 - PCV) Never done   TETANUS/TDAP  Never done   Zoster Vaccines- Shingrix (1 of 2) Never done   INFLUENZA VACCINE  Never done    There are no preventive care reminders to display for this patient.  No results found for: TSH Lab Results  Component Value Date   WBC 5.6 08/27/2020   HGB 15.6 08/27/2020   HCT 45.1 08/27/2020   MCV 94 08/27/2020   PLT 309 08/27/2020   Lab Results  Component Value Date   NA 137 03/02/2021   K 4.2 03/02/2021   CO2 24 03/02/2021   GLUCOSE 106 (H) 03/02/2021   BUN 15 03/02/2021   CREATININE 1.14 03/02/2021   BILITOT 0.6 03/02/2021   ALKPHOS 77 03/02/2021   AST 24 03/02/2021   ALT 20 03/02/2021   PROT 7.7 03/02/2021   ALBUMIN 5.1 (H) 03/02/2021   CALCIUM 10.4 (H) 03/02/2021   ANIONGAP 12 05/27/2018   EGFR 70  03/02/2021   GFR 112.87 10/09/2014   Lab Results  Component Value Date   CHOL 206 (H) 08/27/2020   Lab Results  Component Value Date   HDL 45 08/27/2020   Lab Results  Component Value Date   LDLCALC 135 (H) 08/27/2020   Lab Results  Component Value Date   TRIG 142 08/27/2020   Lab Results  Component Value Date   CHOLHDL 4.6 08/27/2020   Lab Results  Component Value Date   HGBA1C 6.0 (H) 03/02/2021      Assessment & Plan:   Problem List Items Addressed This Visit   None Visit Diagnoses     Chronic left shoulder pain    -  Primary   Relevant Medications   predniSONE (DELTASONE) 10 MG tablet   Other Relevant Orders   DG Shoulder Left       Meds ordered this encounter  Medications   predniSONE (DELTASONE) 10 MG tablet    Sig: Take 4 tablets (40 mg total) by mouth daily with breakfast for 3 days, THEN 3 tablets (30 mg total) daily with breakfast for 3 days, THEN 2 tablets (20 mg total) daily with breakfast for 3 days, THEN 1 tablet (10 mg total) daily with breakfast for 3 days.    Dispense:  30 tablet    Refill:  0    Order Specific Question:   Supervising Provider  Answer:   Asencion Noble E [[id number]]   1. Acute left shoulder pain Trial prednisone taper, continue ibuprofen over-the-counter as needed.  Patient education given on supportive care, red flags given for prompt reevaluation. - predniSONE (DELTASONE) 10 MG tablet; Take 4 tablets (40 mg total) by mouth daily with breakfast for 3 days, THEN 3 tablets (30 mg total) daily with breakfast for 3 days, THEN 2 tablets (20 mg total) daily with breakfast for 3 days, THEN 1 tablet (10 mg total) daily with breakfast for 3 days.  Dispense: 30 tablet; Refill: 0 - DG Shoulder Left; Future  2. Elevated blood pressure reading in office with diagnosis of hypertension Patient encouraged to continue checking blood pressure at home, keeping a written log and having available for all office visits.  Patient encouraged to  return to clinic if blood pressure readings are elevated.    I have reviewed the patient's medical history (PMH, PSH, Social History, Family History, Medications, and allergies) , and have been updated if relevant. I spent 30 minutes reviewing chart and  face to face time with patient.   Follow-up: Return if symptoms worsen or fail to improve.    Loraine Grip Mayers, PA-C

## 2021-04-13 ENCOUNTER — Encounter: Payer: Self-pay | Admitting: Cardiovascular Disease

## 2021-04-13 ENCOUNTER — Ambulatory Visit (INDEPENDENT_AMBULATORY_CARE_PROVIDER_SITE_OTHER): Payer: Medicare (Managed Care) | Admitting: Cardiovascular Disease

## 2021-04-13 VITALS — BP 142/84 | HR 75 | Ht 69.0 in | Wt 150.4 lb

## 2021-04-13 DIAGNOSIS — E782 Mixed hyperlipidemia: Secondary | ICD-10-CM

## 2021-04-13 DIAGNOSIS — I2510 Atherosclerotic heart disease of native coronary artery without angina pectoris: Secondary | ICD-10-CM | POA: Diagnosis not present

## 2021-04-13 NOTE — Patient Instructions (Signed)

## 2021-04-13 NOTE — Progress Notes (Signed)
Cardiology Office Note   Date:  04/13/2021   ID:  Javier Rios, DOB 1951-02-05, MRN [id number]  PCP:  Claiborne Rigg, NP  Cardiologist:   Kristeen Miss, MD   Chief Complaint  Patient presents with   Coronary Artery Disease        Hyperlipidemia   Hypertension        1. History of coronary artery disease-status post PTCA and stenting of his right coronary artery-08/23/2006 he has moderate disease involving his LAD. 2. Hypertension 3. Hyperlipidemia   70 year old gentleman with a history of coronary artery disease. He is status post PTCA and stenting of his right coronary artery. He has a moderate stenosis in his LAD.  His last stress Myoview study was November, 2011. There is no evidence of anterior wall ischemia. He has a mild defect in his inferior wall which corresponds to his previous inferior basilar myocardial infarction.  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  He is not having any chest pain.  He has trouble with getting his meds.  He is eating lots of bologna because it is inexpensive.  His power to his apartment is off and he is unable to eat regular food.  He has been going to [employer] and eating chicken biscuits.    Feb. 6, 2014:  He has stopped taking all medications. He states he is not able to afford them. 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  He ran out of his meds.  He is still eating lots of salty foods ( bologna, fast foods)  January 20, 2013:  Javier Rios is staying active.  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  No CP.   Has not been taking his HCTZ or zocor due to cost.  He is still eating fast foods - although he is trying to cut back.   September 05, 2013:  Javier Rios is still doing well.  Still working 2 jobs - he has been having some indigestion - started taking gaviscon which helps.    He has had some dyspnea .  He has not wanted to have a stress test due to cost.  His  insurance does not cover much.    He walks at work and typically is able to get to his work day without too much trouble.  Nov. 10, 2015:  Javier Rios is seen today for followup of his coronary artery disease and hypertension. His blood pressure is a little elevated today. He stayed up last night and watched the football game.  He also ate some fried chicken which probably had some extra salt in it. His BP has been elevated for the past several office visits.    April 29 , 2016:  Javier Rios is a 70 y.o. male who presents for follow-up of his coronary artery disease. BP is  Elevated this am .  He has been working all night.    He brought his BP log.  His readings are all ok. His blood pressure is typically in the 120/75 range. His heart rate is typically in the 70-80 range.   Nov. 9, 2016:   BP is up today . Has a cold and has been taking some cold meds May have eaten a bit of extra salt recently .   May 17 ,2017:  Doing well. Went to the ER with the   Nov. 28, 2017:  Javier Rios is  seen today for follow-up of his coronary artery disease, hypertension and congestive heart failure. BP is elevated  Still eating lots of salt  Still smoking  Has requested an inhaler   Feb. 27, 2018:  Doing ok Is taking his meds.   BP is well controlled   03/09/2017: Has not been working . Made him pay his money back. He started taking SS at age 39 but he made too much   Has stopped some / most of his meds.  Unsure exactly what he is taking .   Still smoking.   I advised him that he could afford his meds if he quit smoking .  He is still taking atorvastatin ,  HCTZ,  Is not taking Kdur or aldactone  Is having some leg cramps since running out of the kdur .   Oct 12, 2017: Javier Rios seen today.  As usual, he is run out of some of his medications.  His blood pressure remains elevated.  He still smoking. Just got his medicaid card .   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 1, 2019: Javier Rios is doing well. BP is a bit high Has been eating more salt recently  Still smoking . Has cut back some   January 23, 2020:  Javier Rios is seen today for follow-up visit.  He has a history of coronary artery disease, hyperlipidemia, hypertension. No CP  Was having lots of muscle aches with atorvastatin  Also has had an intolerance to rosuvastatin. Still smoking  -   April 13, 2021: Javier Rios seen today for follow-up of his coronary artery disease, hyperlipidemia, hypertension.  He is intolerant to rosuvastatin and also atorvastatin.  We referred him to the lipid clinic at his last visit.  He is now on pravastatin - was started by Bertram Denver, NP  Is due to have repeat blood work next month .   If he is not close to his goal of LDL of 70, will need to consider referral to the lipid clinic for consideration of a PCSK9 inhibitor.  Past Medical History:  Diagnosis Date   Chest pain    Coronary artery disease    Status post PTCA and stenting of his right coronary artery in March, 2008. 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 THERE IS INFEROBASILAR AKINESIS   CARDIOVASCULAR STRESS TEST  04/21/2010   EF 51%. NO SIGNIFICANT ST SEGMENT CHANGE SUGGESTIVE OF ISCHEMIA   CORONARY ANGIOPLASTY WITH STENT PLACEMENT     STENTING OF HIS RCA     Current Outpatient Medications  Medication Sig Dispense Refill   albuterol (PROAIR HFA) 108 (90 Base) MCG/ACT inhaler INHALE 2 PUFFS INTO THE LUNGS EVERY 6 (SIX) HOURS AS NEEDED FOR WHEEZING OR SHORTNESS OF BREATH. 1 Inhaler 3   amLODipine (NORVASC) 10 MG tablet TAKE 1 TABLET (10 MG TOTAL) BY MOUTH DAILY. PLEASE MAKE PCP APPOINTMENT. 90 tablet 1   aspirin 81 MG tablet Take 81 mg by mouth daily.     carvedilol (COREG) 6.25 MG tablet Take 1  tablet (6.25 mg total) by mouth 2 (two) times daily. 180 tablet 1   chlorthalidone (HYGROTON) 25 MG tablet TAKE 1  TABLET (25 MG TOTAL) BY MOUTH DAILY. 90 tablet 1   nitroGLYCERIN (NITROSTAT) 0.4 MG SL tablet PLACE 1 TABLET UNDER THE TOUNGE EVERY AS NEEDED FOR CHEST PAIN 25 tablet 3   pravastatin (PRAVACHOL) 40 MG tablet Take 40 mg by mouth every other day.     sildenafil (VIAGRA) 100 MG tablet TAKE 1 TABLET BY MOUTH DAILY AS NEEDED FOR ERECTILE DYSFUNCTION 10 tablet 3   spironolactone (ALDACTONE) 50 MG tablet TAKE 1 TABLET BY MOUTH EVERY DAY 90 tablet 1   ezetimibe (ZETIA) 10 MG tablet Take 1 tablet (10 mg total) by mouth daily. (Patient not taking: Reported on 04/13/2021) 90 tablet 3   predniSONE (DELTASONE) 10 MG tablet Take 4 tablets (40 mg total) by mouth daily with breakfast for 3 days, THEN 3 tablets (30 mg total) daily with breakfast for 3 days, THEN 2 tablets (20 mg total) daily with breakfast for 3 days, THEN 1 tablet (10 mg total) daily with breakfast for 3 days. (Patient not taking: Reported on 04/13/2021) 30 tablet 0   No current facility-administered medications for this visit.    Allergies:   Rosuvastatin and Atorvastatin    Social History:  The patient  reports that he has been smoking cigarettes. He has been smoking an average of 1 pack per day. He has never used smokeless tobacco. He reports current alcohol use. He reports current drug use. Drug: Marijuana.   Family History:  The patient's family history includes Hypertension in his brother, daughter, and mother.    ROS:  Noted in current hx     Physical Exam: Blood pressure (!) 142/84, pulse 75, height 5\' 9"  (1.753 m), weight 150 lb 6.4 oz (68.2 kg), SpO2 99 %.  GEN:  Well nourished, well developed in no acute distress HEENT: Normal NECK: No JVD; No carotid bruits LYMPHATICS: No lymphadenopathy CARDIAC: RRR , no murmurs, rubs, gallops RESPIRATORY:  Clear to auscultation without rales, wheezing or rhonchi   ABDOMEN: Soft, non-tender, non-distended MUSCULOSKELETAL:  No edema; No deformity  SKIN: Warm and dry NEUROLOGIC:  Alert and oriented x 3   EKG:    April 13, 2021: Normal sinus rhythm at 75.  Previous inferior wall myocardial infarction.  No changes from previous EKG.   Recent Labs: 08/27/2020: Hemoglobin 15.6; Platelets 309 03/02/2021: ALT 20; BUN 15; Creatinine, Ser 1.14; Potassium 4.2; Sodium 137    Lipid Panel    Component Value Date/Time   CHOL 206 (H) 08/27/2020 0953   TRIG 142 08/27/2020 0953   HDL 45 08/27/2020 0953   CHOLHDL 4.6 08/27/2020 0953   CHOLHDL 3.2 10/27/2015 1132   VLDL 32 (H) 10/27/2015 1132   LDLCALC 135 (H) 08/27/2020 0953   LDLDIRECT 137.2 01/20/2013 1016      Wt Readings from Last 3 Encounters:  04/13/21 150 lb 6.4 oz (68.2 kg)  04/12/21 149 lb (67.6 kg)  03/02/21 145 lb 2 oz (65.8 kg)     Other studies Reviewed: Additional studies/ records that were reviewed today include: . Review of the above records demonstrates:    ASSESSMENT AND PLAN:  1. History of coronary artery disease-         2.  Hypertensive heart disease without congestive heart failure       3. Hyperlipidemia -     .    He is intolerant to atorvastatin and rosuvastatin.  He seems to be tolerating low-dose pravastatin fairly well.  I am worried that he still might not be at goal.  He has had an inferior wall myocardial infarction and his LDL goal is less than 70.  If he still does not reach goal and I would suggest we refer him to our lipid clinic to consider PCSK9 or inclirasan   Current medicines are reviewed at length with the patient today.  The patient does not have concerns regarding medicines.  The following changes have been made:  no change  Labs/ tests ordered today include:   Orders Placed This Encounter  Procedures   EKG 12-Lead      Disposition:   Office visit in 6 months    Kristeen Miss, MD  04/13/2021 2:10 PM    [facility]  [facility] [address] [place], [place], [place]  [address] Phone: [phone]; Fax: [phone]

## 2021-04-14 ENCOUNTER — Telehealth: Payer: Self-pay | Admitting: *Deleted

## 2021-04-14 NOTE — Telephone Encounter (Signed)
-----   Message from Roney Jaffe, [place] sent at 04/13/2021  9:58 AM EDT ----- Please call patient and let him know that his shoulder x-ray did not show any acute abnormalities.  If pain does not improve with prescribed regimen, we will consider referring to orthopedics for further evaluation.

## 2021-04-14 NOTE — Telephone Encounter (Signed)
Patient verified DOB Patient is aware of no acute changes and needing to continue regimen and follow up with PCP in December.

## 2021-05-09 ENCOUNTER — Other Ambulatory Visit: Payer: Self-pay | Admitting: Nurse Practitioner

## 2021-05-09 ENCOUNTER — Other Ambulatory Visit: Payer: Self-pay | Admitting: Family Medicine

## 2021-05-09 DIAGNOSIS — I10 Essential (primary) hypertension: Secondary | ICD-10-CM

## 2021-05-09 DIAGNOSIS — G8929 Other chronic pain: Secondary | ICD-10-CM

## 2021-05-10 NOTE — Telephone Encounter (Signed)
Requested medications are due for refill today no, filled for 90 day supply on 03/25/21  Requested medications are on the active medication list Aldactone yes, Prednisone No  Last refill filled Aldactone 03/25/21  Last visit 04/12/21  Future visit scheduled 06/03/21  Notes to clinic requesting Aldactone early, prednisone was ordered for 5 days only, please assess. Requested Prescriptions  Pending Prescriptions Disp Refills   spironolactone (ALDACTONE) 50 MG tablet [Pharmacy Med Name: SPIRONOLACTONE 50 MG TABLET] 90 tablet 1    Sig: TAKE 1 TABLET BY MOUTH EVERY DAY     Cardiovascular: Diuretics - Aldosterone Antagonist Failed - 05/09/2021  8:50 AM      Failed - Last BP in normal range    BP Readings from Last 1 Encounters:  04/13/21 (!) 142/84          Passed - Cr in normal range and within 360 days    Creat  Date Value Ref Range Status  05/24/2016 1.41 (H) 0.70 - 1.25 mg/dL Final    Comment:      For patients > or = 68 years of age: The upper reference limit for Creatinine is approximately 13% higher for people identified as African-American.      Creatinine, Ser  Date Value Ref Range Status  03/02/2021 1.14 0.76 - 1.27 mg/dL Final          Passed - K in normal range and within 360 days    Potassium  Date Value Ref Range Status  03/02/2021 4.2 3.5 - 5.2 mmol/L Final          Passed - Na in normal range and within 360 days    Sodium  Date Value Ref Range Status  03/02/2021 137 134 - 144 mmol/L Final          Passed - Valid encounter within last 6 months    Recent Outpatient Visits           4 weeks ago Acute pain of left shoulder   [facility] [place], Cari S, [place]   2 months ago Primary hypertension   [facility] [place], Shea Stakes, NP   8 months ago Primary hypertension   [facility] [employer], Shea Stakes, NP   10 months ago Essential hypertension   [facility] Drucilla Chalet, RPH-CPP   1 year ago Essential hypertension   [facility] Drucilla Chalet, RPH-CPP       Future Appointments             In 3 weeks Claiborne Rigg, NP [facility]             predniSONE (DELTASONE) 10 MG tablet [Pharmacy Med Name: PREDNISONE 10 MG TABLET] 5 tablet 0    Sig: Take 1 tablet (10 mg total) by mouth daily with breakfast for 5 days.     Not Delegated - Endocrinology:  Oral Corticosteroids Failed - 05/09/2021  8:50 AM      Failed - This refill cannot be delegated      Failed - Last BP in normal range    BP Readings from Last 1 Encounters:  04/13/21 (!) 142/84          Passed - Valid encounter within last 6 months    Recent Outpatient Visits           4 weeks ago  Acute pain of left shoulder   [facility] [place], Cari S, [place]   2 months ago Primary hypertension   [facility] [place], Shea Stakes, NP   8 months ago Primary hypertension   [place] [address] [facility] [place], Shea Stakes, NP   10 months ago Essential hypertension   [facility] Drucilla Chalet, RPH-CPP   1 year ago Essential hypertension   [facility] [place], Cornelius Moras, RPH-CPP       Future Appointments             In 3 weeks Claiborne Rigg, NP [facility]

## 2021-05-10 NOTE — Telephone Encounter (Signed)
   Notes to clinic duplicate request. Already sent information to [facility] pool.

## 2021-05-17 ENCOUNTER — Ambulatory Visit: Payer: Medicare (Managed Care) | Admitting: Cardiovascular Disease

## 2021-05-25 ENCOUNTER — Ambulatory Visit: Payer: Self-pay | Admitting: *Deleted

## 2021-05-25 ENCOUNTER — Other Ambulatory Visit: Payer: Self-pay | Admitting: Nurse Practitioner

## 2021-05-25 MED ORDER — ACETAMINOPHEN-CODEINE #3 300-30 MG PO TABS
1.0000 | ORAL_TABLET | ORAL | 0 refills | Status: DC | PRN
Start: 2021-05-25 — End: 2024-04-24

## 2021-05-25 NOTE — Telephone Encounter (Signed)
Patient calling in checking on the status of his prednisone refill, pt stated that a nurse told him that they were going to call him back and he's been waiting since 11/28. Patient wants to know why it was denied. Please advise [phone]       Attempted to reach pt, left VM to call back to discuss

## 2021-05-25 NOTE — Telephone Encounter (Signed)
Return call from patient . Requesting medication prednisone due to "bursitis" in left shoulder.   Chief Complaint: left shoulder pain x 2 months requesting prednisone as prescribed before Symptoms: left shoulder pain awakens from sleep moderate pain  Frequency: with use . Takes ibuprofen at times with some relief. Concerned regarding taking too much ibuprofen for long period of time  Pertinent Negatives: Patient denies neck pain, numbness, inability to use arm  Disposition: [] ED /[] Urgent Care (no appt availability in office) / [x] Appointment(In office/virtual)/ []  [facility]/ [] Home Care/ [] Refused Recommended Disposition  Additional Notes:  Requesting prednisone due to when he took it before for shoulder pain it worked well. Appt scheduled for 06/15/21. Please notify patient if earlier appt available. Care advise if worsening symptoms go to UC or ED.       Reason for Disposition  [1] MILD pain (e.g., does not interfere with normal activities) AND [2] present > 7 days  Answer Assessment - Initial Assessment Questions 1. ONSET: "When did the pain start?"     X 2 months ago  2. LOCATION: "Where is the pain located?"     Left shoulder  3. PAIN: "How bad is the pain?" (Scale 1-10; or mild, moderate, severe)   - MILD (1-3): doesn't interfere with normal activities   - MODERATE (4-7): interferes with normal activities (e.g., work or school) or awakens from sleep   - SEVERE (8-10): excruciating pain, unable to do any normal activities, unable to move arm at all due to pain     Moderate  4. WORK OR EXERCISE: "Has there been any recent work or exercise that involved this part of the body?"     No  5. CAUSE: "What do you think is causing the shoulder pain?"     Sleeping on left side  6. OTHER SYMPTOMS: "Do you have any other symptoms?" (e.g., neck pain, swelling, rash, fever, numbness, weakness)     Na  7. PREGNANCY: "Is there any chance you are pregnant?" "When was your last  menstrual period?"     na  Protocols used: Shoulder Pain-A-AH

## 2021-05-26 NOTE — Telephone Encounter (Signed)
Per patient, he was calling to get medication for pain.  Rx was sent today.

## 2021-06-03 ENCOUNTER — Ambulatory Visit: Payer: Medicare (Managed Care) | Admitting: Nurse Practitioner

## 2021-06-15 ENCOUNTER — Other Ambulatory Visit: Payer: Self-pay

## 2021-06-15 ENCOUNTER — Ambulatory Visit: Payer: Medicare (Managed Care) | Attending: Nurse Practitioner | Admitting: Nurse Practitioner

## 2021-06-15 ENCOUNTER — Encounter: Payer: Self-pay | Admitting: Nurse Practitioner

## 2021-06-15 VITALS — BP 136/79 | HR 73 | Ht 69.0 in | Wt 150.2 lb

## 2021-06-15 DIAGNOSIS — E782 Mixed hyperlipidemia: Secondary | ICD-10-CM | POA: Diagnosis not present

## 2021-06-15 DIAGNOSIS — M25512 Pain in left shoulder: Secondary | ICD-10-CM

## 2021-06-15 DIAGNOSIS — I10 Essential (primary) hypertension: Secondary | ICD-10-CM | POA: Diagnosis not present

## 2021-06-15 DIAGNOSIS — N529 Male erectile dysfunction, unspecified: Secondary | ICD-10-CM | POA: Diagnosis not present

## 2021-06-15 DIAGNOSIS — J449 Chronic obstructive pulmonary disease, unspecified: Secondary | ICD-10-CM

## 2021-06-15 DIAGNOSIS — G8929 Other chronic pain: Secondary | ICD-10-CM

## 2021-06-15 MED ORDER — CHLORTHALIDONE 25 MG PO TABS: 25.0000 mg | ORAL_TABLET | Freq: Every day | ORAL | 1 refills | Status: AC

## 2021-06-15 MED ORDER — AMLODIPINE BESYLATE 10 MG PO TABS: 10.0000 mg | ORAL_TABLET | Freq: Every day | ORAL | 1 refills | Status: AC

## 2021-06-15 MED ORDER — ALBUTEROL SULFATE HFA 108 (90 BASE) MCG/ACT IN AERS: INHALATION_SPRAY | RESPIRATORY_TRACT | 3 refills | Status: AC

## 2021-06-15 MED ORDER — EZETIMIBE 10 MG PO TABS: 10.0000 mg | ORAL_TABLET | Freq: Every day | ORAL | 3 refills | Status: DC

## 2021-06-15 MED ORDER — PRAVASTATIN SODIUM 40 MG PO TABS: 40.0000 mg | ORAL_TABLET | ORAL | 1 refills | Status: DC

## 2021-06-15 MED ORDER — SILDENAFIL CITRATE 100 MG PO TABS: ORAL_TABLET | ORAL | 3 refills | Status: DC

## 2021-06-15 MED ORDER — CARVEDILOL 6.25 MG PO TABS: 6.2500 mg | ORAL_TABLET | Freq: Two times a day (BID) | ORAL | 1 refills | Status: DC

## 2021-06-15 MED ORDER — SPIRONOLACTONE 50 MG PO TABS: 50.0000 mg | ORAL_TABLET | Freq: Every day | ORAL | 1 refills | Status: AC

## 2021-06-15 NOTE — Progress Notes (Signed)
Assessment & Plan:  Javier Rios was seen today for hypertension.  Diagnoses and all orders for this visit:  Primary hypertension -     amLODipine (NORVASC) 10 MG tablet; Take 1 tablet (10 mg total) by mouth daily. Please make PCP appointment. -     chlorthalidone (HYGROTON) 25 MG tablet; Take 1 tablet (25 mg total) by mouth daily. -     carvedilol (COREG) 6.25 MG tablet; Take 1 tablet (6.25 mg total) by mouth 2 (two) times daily. -     spironolactone (ALDACTONE) 50 MG tablet; Take 1 tablet (50 mg total) by mouth daily. -     CMP14+EGFR Continue all antihypertensives as prescribed.  Remember to bring in your blood pressure log with you for your follow up appointment.  DASH/Mediterranean Diets are healthier choices for HTN.    Chronic left shoulder pain -     Ambulatory referral to Orthopedic Surgery -     MR SHOULDER LEFT WO CONTRAST; Future  Erectile dysfunction, unspecified erectile dysfunction type -     sildenafil (VIAGRA) 100 MG tablet; TAKE 1 TABLET BY MOUTH DAILY AS NEEDED FOR ERECTILE DYSFUNCTION  Mixed hyperlipidemia -     pravastatin (PRAVACHOL) 40 MG tablet; Take 1 tablet (40 mg total) by mouth every other day. -     ezetimibe (ZETIA) 10 MG tablet; Take 1 tablet (10 mg total) by mouth daily.  Chronic obstructive pulmonary disease, unspecified COPD type (HCC) -     albuterol (PROAIR HFA) 108 (90 Base) MCG/ACT inhaler; INHALE 2 PUFFS INTO THE LUNGS EVERY 6 (SIX) HOURS AS NEEDED FOR WHEEZING OR SHORTNESS OF BREATH.    Patient has been counseled on age-appropriate routine health concerns for screening and prevention. These are reviewed and up-to-date. Referrals have been placed accordingly. Immunizations are up-to-date or declined.    Subjective:   Chief Complaint  Patient presents with   Hypertension   HPI Javier Rios 71 y.o. male presents to office today for follow-up to hypertension and chronic left shoulder pain.  Hypertension Blood pressure is well controlled  today. He endorses adherence taking amlodipine 29m daily, carvedilol 6.25 mg BID, chlorthalidone 25 mg daily and spironolactone 50 mg daily.  BP Readings from Last 3 Encounters:  06/15/21 136/79  04/13/21 (!) 142/84  04/12/21 (!) 160/93     Left Shoulder Pain Chronic and on going for several months. Taking tylenol #3 as needed for pain. Denies any injury or trauma. Pain is aggravated by rotating, abduction, adduction or lying on the left side.    Review of Systems  Constitutional:  Negative for fever, malaise/fatigue and weight loss.  HENT: Negative.  Negative for nosebleeds.   Eyes: Negative.  Negative for blurred vision, double vision and photophobia.  Respiratory: Negative.  Negative for cough and shortness of breath.   Cardiovascular: Negative.  Negative for chest pain, palpitations and leg swelling.  Gastrointestinal: Negative.  Negative for heartburn, nausea and vomiting.  Genitourinary:        ED  Musculoskeletal:  Positive for joint pain. Negative for myalgias.  Neurological: Negative.  Negative for dizziness, focal weakness, seizures and headaches.  Psychiatric/Behavioral: Negative.  Negative for suicidal ideas.    Past Medical History:  Diagnosis Date   Chest pain    Coronary artery disease    Status post PTCA and stenting of his right coronary artery in March, 2008. He also has a 40-50% LAD stenosis   Hypertension    LAD stenosis    MODERATE BETWEEN 50-60%  MI, old 2008   INFERIOR WALL    Past Surgical History:  Procedure Laterality Date   CARDIAC CATHETERIZATION  08/23/2006   EF 65%. THERE IS INFEROBASILAR AKINESIS   CARDIOVASCULAR STRESS TEST  04/21/2010   EF 51%. NO SIGNIFICANT ST SEGMENT CHANGE SUGGESTIVE OF ISCHEMIA   CORONARY ANGIOPLASTY WITH STENT PLACEMENT     STENTING OF HIS RCA    Family History  Problem Relation Age of Onset   Hypertension Mother    Hypertension Brother    Hypertension Daughter     Social History Reviewed with no changes to  be made today.   Outpatient Medications Prior to Visit  Medication Sig Dispense Refill   acetaminophen-codeine (TYLENOL #3) 300-30 MG tablet Take 1 tablet by mouth every 4 (four) hours as needed for moderate pain. 30 tablet 0   aspirin 81 MG tablet Take 81 mg by mouth daily.     nitroGLYCERIN (NITROSTAT) 0.4 MG SL tablet PLACE 1 TABLET UNDER THE TOUNGE EVERY 5MINS AS NEEDED FOR CHEST PAIN 25 tablet 3   albuterol (PROAIR HFA) 108 (90 Base) MCG/ACT inhaler INHALE 2 PUFFS INTO THE LUNGS EVERY 6 (SIX) HOURS AS NEEDED FOR WHEEZING OR SHORTNESS OF BREATH. 1 Inhaler 3   amLODipine (NORVASC) 10 MG tablet TAKE 1 TABLET (10 MG TOTAL) BY MOUTH DAILY. PLEASE MAKE PCP APPOINTMENT. 90 tablet 1   carvedilol (COREG) 6.25 MG tablet Take 1 tablet (6.25 mg total) by mouth 2 (two) times daily. 180 tablet 1   chlorthalidone (HYGROTON) 25 MG tablet TAKE 1 TABLET (25 MG TOTAL) BY MOUTH DAILY. 30 tablet 0   ezetimibe (ZETIA) 10 MG tablet Take 1 tablet (10 mg total) by mouth daily. 90 tablet 3   pravastatin (PRAVACHOL) 40 MG tablet Take 40 mg by mouth every other day.     sildenafil (VIAGRA) 100 MG tablet TAKE 1 TABLET BY MOUTH DAILY AS NEEDED FOR ERECTILE DYSFUNCTION 10 tablet 3   spironolactone (ALDACTONE) 50 MG tablet TAKE 1 TABLET BY MOUTH EVERY DAY 30 tablet 0   No facility-administered medications prior to visit.    Allergies  Allergen Reactions   Rosuvastatin Other (See Comments)    Muscle aches    Atorvastatin     Muscle aches       Objective:    BP 136/79    Pulse 73    Ht _0  (1.753 m)    Wt 150 lb 4 oz (68.2 kg)    SpO2 98%    BMI 22.19 kg/m  Wt Readings from Last 3 Encounters:  06/15/21 150 lb 4 oz (68.2 kg)  04/13/21 150 lb 6.4 oz (68.2 kg)  04/12/21 149 lb (67.6 kg)    Physical Exam Vitals and nursing note reviewed.  Constitutional:      Appearance: He is well-developed.  HENT:     Head: Normocephalic and atraumatic.  Cardiovascular:     Rate and Rhythm: Normal rate and regular  rhythm.     Heart sounds: Normal heart sounds. No murmur heard.   No friction rub. No gallop.  Pulmonary:     Effort: Pulmonary effort is normal. No tachypnea or respiratory distress.     Breath sounds: Normal breath sounds. No decreased breath sounds, wheezing, rhonchi or rales.  Chest:     Chest wall: No tenderness.  Abdominal:     General: Bowel sounds are normal.     Palpations: Abdomen is soft.  Musculoskeletal:        General: Normal range of  motion.     Cervical back: Normal range of motion.  Skin:    General: Skin is warm and dry.  Neurological:     Mental Status: He is alert and oriented to person, place, and time.     Coordination: Coordination normal.  Psychiatric:        Behavior: Behavior normal. Behavior is cooperative.        Thought Content: Thought content normal.        Judgment: Judgment normal.         Patient has been counseled extensively about nutrition and exercise as well as the importance of adherence with medications and regular follow-up. The patient was given clear instructions to go to ER or return to medical center if symptoms don't improve, worsen or new problems develop. The patient verbalized understanding.   Follow-up: Return in about 3 months (around 09/13/2021).   Gildardo Pounds, FNP-BC [facility] [place], [place]   06/15/2021, 2:17 PM

## 2021-06-16 ENCOUNTER — Telehealth: Payer: Self-pay

## 2021-06-16 LAB — CMP14+EGFR
ALT: 18 IU/L (ref 0–44)
AST: 22 IU/L (ref 0–40)
Albumin/Globulin Ratio: 2.1 (ref 1.2–2.2)
Albumin: 4.9 g/dL — ABNORMAL HIGH (ref 3.8–4.8)
Alkaline Phosphatase: 81 IU/L (ref 44–121)
BUN/Creatinine Ratio: 13 (ref 10–24)
BUN: 14 mg/dL (ref 8–27)
Bilirubin Total: 0.5 mg/dL (ref 0.0–1.2)
CO2: 23 mmol/L (ref 20–29)
Calcium: 9.8 mg/dL (ref 8.6–10.2)
Chloride: 98 mmol/L (ref 96–106)
Creatinine, Ser: 1.1 mg/dL (ref 0.76–1.27)
Globulin, Total: 2.3 g/dL (ref 1.5–4.5)
Glucose: 99 mg/dL (ref 70–99)
Potassium: 4.7 mmol/L (ref 3.5–5.2)
Sodium: 138 mmol/L (ref 134–144)
Total Protein: 7.2 g/dL (ref 6.0–8.5)
eGFR: 73 mL/min/{1.73_m2} (ref >59)

## 2021-06-16 NOTE — Telephone Encounter (Signed)
-----   Message from Claiborne Rigg, NP sent at 06/15/2021  1:37 PM EST ----- Needs MRI on a Thursday morning. Thanks!

## 2021-06-16 NOTE — Telephone Encounter (Signed)
Pt was informed of appointment.

## 2021-06-17 ENCOUNTER — Other Ambulatory Visit: Payer: Self-pay | Admitting: *Deleted

## 2021-06-17 DIAGNOSIS — Z87891 Personal history of nicotine dependence: Secondary | ICD-10-CM

## 2021-06-17 DIAGNOSIS — F17210 Nicotine dependence, cigarettes, uncomplicated: Secondary | ICD-10-CM

## 2021-06-20 ENCOUNTER — Telehealth: Payer: Self-pay

## 2021-06-20 NOTE — Telephone Encounter (Signed)
Left message to return call to our office.  

## 2021-06-20 NOTE — Telephone Encounter (Signed)
Called patient reviewed all information and repeated back to me. Will call if any questions.  ? ?

## 2021-06-20 NOTE — Telephone Encounter (Signed)
-----   Message from Gildardo Pounds, NP sent at 06/19/2021  8:51 AM EST ----- Kidney, liver function and electrolytes are normal.

## 2021-06-20 NOTE — Telephone Encounter (Signed)
-----   Message from Zelda W Fleming, NP sent at 06/19/2021  8:51 AM EST ----- °Kidney, liver function and electrolytes are normal.   °

## 2021-06-23 ENCOUNTER — Ambulatory Visit (HOSPITAL_COMMUNITY): Admission: RE | Admit: 2021-06-23 | Payer: Medicare (Managed Care) | Source: Ambulatory Visit

## 2021-06-29 ENCOUNTER — Encounter: Payer: Self-pay | Admitting: Orthopaedic Surgery

## 2021-06-29 ENCOUNTER — Ambulatory Visit (INDEPENDENT_AMBULATORY_CARE_PROVIDER_SITE_OTHER): Payer: Medicare (Managed Care) | Admitting: Orthopaedic Surgery

## 2021-06-29 ENCOUNTER — Ambulatory Visit (INDEPENDENT_AMBULATORY_CARE_PROVIDER_SITE_OTHER): Payer: Medicare (Managed Care)

## 2021-06-29 ENCOUNTER — Other Ambulatory Visit: Payer: Self-pay

## 2021-06-29 DIAGNOSIS — M542 Cervicalgia: Secondary | ICD-10-CM | POA: Diagnosis not present

## 2021-06-29 MED ORDER — PREDNISONE 10 MG (21) PO TBPK: ORAL_TABLET | ORAL | 0 refills | Status: DC

## 2021-06-29 MED ORDER — DICLOFENAC SODIUM 75 MG PO TBEC: 75.0000 mg | DELAYED_RELEASE_TABLET | Freq: Two times a day (BID) | ORAL | 2 refills | Status: DC | PRN

## 2021-06-29 MED ORDER — METHOCARBAMOL 500 MG PO TABS: 500.0000 mg | ORAL_TABLET | Freq: Two times a day (BID) | ORAL | 0 refills | Status: DC | PRN

## 2021-06-29 NOTE — Progress Notes (Signed)
Office Visit Note   Patient: Javier Rios           Date of Birth: [birth year]           MRN: [id number] Visit Date: 06/29/2021              Requested by: Claiborne Rigg, NP [address] [place],  [place] [address] PCP: Claiborne Rigg, NP   Assessment & Plan: Visit Diagnoses:  1. Neck pain     Plan: Impression is left upper extremity cervical spine radiculopathy.  I would like to go ahead and start the patient on a steroid taper and muscle relaxers followed by a course of NSAIDs as needed.  I would also like to start him in formal physical therapy.  He has agreed to this plan.  He will follow-up with [place] as needed.  Follow-Up Instructions: Return if symptoms worsen or fail to improve.   Orders:  Orders Placed This Encounter  Procedures   XR Cervical Spine 2 or 3 views   Ambulatory referral to Physical Therapy   Meds ordered this encounter  Medications   predniSONE (STERAPRED UNI-PAK 21 TAB) 10 MG (21) TBPK tablet    Sig: Take as directed. Do not take with nsaids    Dispense:  21 tablet    Refill:  0   diclofenac (VOLTAREN) 75 MG EC tablet    Sig: Take 1 tablet (75 mg total) by mouth 2 (two) times daily between meals as needed. Do not start taking until finished with steroid taper    Dispense:  60 tablet    Refill:  2   methocarbamol (ROBAXIN) 500 MG tablet    Sig: Take 1 tablet (500 mg total) by mouth 2 (two) times daily as needed.    Dispense:  20 tablet    Refill:  0      Procedures: No procedures performed   Clinical Data: No additional findings.   Subjective: Chief Complaint  Patient presents with   Left Shoulder - Pain    HPI patient is a pleasant 71 year old gentleman who comes in today with left shoulder pain for the past 3 months which is progressively worsened.  He denies any injury or change in activity.  The pain he has is primarily to the top of the shoulder and radiates to the lateral left neck.  Pain appears to be worse with neck flexion.   He denies any weakness to the left upper extremity.  He denies any paresthesias to left upper extremity.  No previous neck pathology.  He does note that he had bursitis to the left shoulder in the past which did seem to resolve with time.  Review of Systems as detailed in HPI.  All others reviewed and are negative.   Objective: Vital Signs: There were no vitals taken for this visit.  Physical Exam well-developed well-nourished gentleman in no acute distress.  Alert and oriented x3.  Ortho Exam left shoulder exam shows full active painless range of motion all planes.  Negative empty can, negative cross body adduction.  Full strength throughout.  Cervical spine exam shows no spinous tenderness.  Does have left-sided paraspinous tenderness and tenderness into the parascapular region.  He has increased pain with cervical spine flexion.  He is neurovascular intact distally.  Specialty Comments:  No specialty comments available.  Imaging: XR Cervical Spine 2 or 3 views  Result Date: 06/29/2021 X-rays demonstrate multilevel degenerative changes worse at C4-5, C5-6, C6-7 and C7-T1 with anterior  osteophyte formation.  Also noted a straightening of the cervical spine    PMFS History: Patient Active Problem List   Diagnosis Date Noted   Chronic left shoulder pain 04/12/2021   Elevated blood pressure reading in office with diagnosis of hypertension 04/12/2021   Systolic murmur 10/27/2015   Hyperlipidemia 10/09/2014   COPD (chronic obstructive pulmonary disease) (HCC) 09/05/2013   Hypertension 10/12/2011   CAD (coronary artery disease) 03/29/2011   Past Medical History:  Diagnosis Date   Chest pain    Coronary artery disease    Status post PTCA and stenting of his right coronary artery in March, 2008. He also has a 40-50% LAD stenosis   Hypertension    LAD stenosis    MODERATE BETWEEN 50-60%   MI, old 2008   INFERIOR WALL    Family History  Problem Relation Age of Onset   Hypertension  Mother    Hypertension Brother    Hypertension Daughter     Past Surgical History:  Procedure Laterality Date   CARDIAC CATHETERIZATION  08/23/2006   EF 65%. THERE IS INFEROBASILAR AKINESIS   CARDIOVASCULAR STRESS TEST  04/21/2010   EF 51%. NO SIGNIFICANT ST SEGMENT CHANGE SUGGESTIVE OF ISCHEMIA   CORONARY ANGIOPLASTY WITH STENT PLACEMENT     STENTING OF HIS RCA   Social History   Occupational History   Not on file  Tobacco Use   Smoking status: Every Day    Packs/day: 1.00    Types: Cigarettes   Smokeless tobacco: Never  Vaping Use   Vaping Use: Never used  Substance and Sexual Activity   Alcohol use: Yes    Comment: ocassion   Drug use: Yes    Types: Marijuana   Sexual activity: Yes

## 2021-07-05 ENCOUNTER — Other Ambulatory Visit: Payer: Self-pay

## 2021-07-05 ENCOUNTER — Ambulatory Visit (INDEPENDENT_AMBULATORY_CARE_PROVIDER_SITE_OTHER): Payer: Medicare (Managed Care) | Admitting: Acute Care

## 2021-07-05 ENCOUNTER — Encounter: Payer: Self-pay | Admitting: Acute Care

## 2021-07-05 DIAGNOSIS — F17210 Nicotine dependence, cigarettes, uncomplicated: Secondary | ICD-10-CM | POA: Diagnosis not present

## 2021-07-05 NOTE — Progress Notes (Signed)
Virtual Visit via Telephone Note  I connected with [name] on 07/05/21 at 10:30 AM EST by telephone and verified that I am speaking with the correct person using two identifiers.  Location: Patient:  At home Provider:  [address], [place], [place], [address]    I discussed the limitations, risks, security and privacy concerns of performing an evaluation and management service by telephone and the availability of in person appointments. I also discussed with the patient that there may be a patient responsible charge related to this service. The patient expressed understanding and agreed to proceed.    Shared Decision Making Visit Lung Cancer Screening Program [phone])   Eligibility: Age 71 y.o. Pack Years Smoking History Calculation 52 pack year smoking history (# packs/per year x # years smoked) Recent History of coughing up blood  no Unexplained weight loss? no ( >Than 15 pounds within the last 6 months ) Prior History Lung / other cancer no (Diagnosis within the last 5 years already requiring surveillance chest CT Scans). Smoking Status Current Smoker Former Smokers: Years since quit:  NA  Quit Date:  NA  Visit Components: Discussion included one or more decision making aids. yes Discussion included risk/benefits of screening. yes Discussion included potential follow up diagnostic testing for abnormal scans. yes Discussion included meaning and risk of over diagnosis. yes Discussion included meaning and risk of False Positives. yes Discussion included meaning of total radiation exposure. yes  Counseling Included: Importance of adherence to annual lung cancer LDCT screening. yes Impact of comorbidities on ability to participate in the program. yes Ability and willingness to under diagnostic treatment. yes  Smoking Cessation Counseling: Current Smokers:  Discussed importance of smoking cessation. yes Information about tobacco cessation classes and  interventions provided to patient. yes Patient provided with "ticket" for LDCT Scan. yes Symptomatic Patient. no  Counseling NA Diagnosis Code: Tobacco Use Z72.0 Asymptomatic Patient yes  Counseling (Intermediate counseling: > three minutes counseling) U0454 Former Smokers:  Discussed the importance of maintaining cigarette abstinence. yes Diagnosis Code: Personal History of Nicotine Dependence. U98.119 Information about tobacco cessation classes and interventions provided to patient. Yes Patient provided with "ticket" for LDCT Scan. yes Written Order for Lung Cancer Screening with LDCT placed in Epic. Yes (CT Chest Lung Cancer Screening Low Dose W/O CM) [id number] Z12.2-Screening of respiratory organs Z87.891-Personal history of nicotine dependence  I have spent 25 minutes of face to face/ virtual visit   time with  [name] discussing the risks and benefits of lung cancer screening. We viewed / discussed a power point together that explained in detail the above noted topics. We paused at intervals to allow for questions to be asked and answered to ensure understanding.We discussed that the single most powerful action that he can take to decrease his risk of developing lung cancer is to quit smoking. We discussed whether or not he is ready to commit to setting a quit date. We discussed options for tools to aid in quitting smoking including nicotine replacement therapy, non-nicotine medications, support groups, Quit Smart classes, and behavior modification. We discussed that often times setting smaller, more achievable goals, such as eliminating 1 cigarette a day for a week and then 2 cigarettes a day for a week can be helpful in slowly decreasing the number of cigarettes smoked. This allows for a sense of accomplishment as well as providing a clinical benefit. I provided  him  with smoking cessation  information  with contact information for community resources, classes,  free nicotine replacement  therapy, and access to mobile apps, text messaging, and on-line smoking cessation help. I have also provided  him  the office contact information in the event he needs to contact me, or the screening staff. We discussed the time and location of the scan, and that either Abigail Miyamoto RN, Karlton Lemon, RN  or I will call / send a letter with the results within 24-72 hours of receiving them. The patient verbalized understanding of all of  the above and had no further questions upon leaving the office. They have my contact information in the event they have any further questions.  I spent 3 minutes counseling on smoking cessation and the health risks of continued tobacco abuse.  I explained to the patient that there has been a high incidence of coronary artery disease noted on these exams. I explained that this is a non-gated exam therefore degree or severity cannot be determined. This patient is on statin therapy. I have asked the patient to follow-up with their PCP regarding any incidental finding of coronary artery disease and management with diet or medication as their PCP  feels is clinically indicated. The patient verbalized understanding of the above and had no further questions upon completion of the visit.      Bevelyn Ngo, NP 07/05/2021

## 2021-07-05 NOTE — Patient Instructions (Signed)
Thank you for participating in the [facility] Lung Cancer Screening Program. °It was our pleasure to meet you today. °We will call you with the results of your scan within the next few days. °Your scan will be assigned a Lung RADS category score by the physicians reading the scans.  °This Lung RADS score determines follow up scanning.  °See below for description of categories, and follow up screening recommendations. °We will be in touch to schedule your follow up screening annually or based on recommendations of our providers. °We will fax a copy of your scan results to your Primary Care Physician, or the physician who referred you to the program, to ensure they have the results. °Please call the office if you have any questions or concerns regarding your scanning experience or results.  °Our office number is [phone]. °Please speak with Denise Phelps, RN. She is our Lung Cancer Screening RN. °If she is unavailable when you call, please have the office staff send her a message. She will return your call at her earliest convenience. °Remember, if your scan is normal, we will scan you annually as long as you continue to meet the criteria for the program. (Age 55-77, Current smoker or smoker who has quit within the last 15 years). °If you are a smoker, remember, quitting is the single most powerful action that you can take to decrease your risk of lung cancer and other pulmonary, breathing related problems. °We know quitting is hard, and we are here to help.  °Please let us know if there is anything we can do to help you meet your goal of quitting. °If you are a former smoker, congratulations. We are proud of you! Remain smoke free! °Remember you can refer friends or family members through the number above.  °We will screen them to make sure they meet criteria for the program. °Thank you for helping us take better care of you by participating in Lung Screening. ° °You can receive free nicotine replacement therapy  ( patches, gum or mints) by calling 1-800-QUIT NOW. Please call so we can get you on the path to becoming  a non-smoker. I know it is hard, but you can do this! ° °Lung RADS Categories: ° °Lung RADS 1: no nodules or definitely non-concerning nodules.  °Recommendation is for a repeat annual scan in 12 months. ° °Lung RADS 2:  nodules that are non-concerning in appearance and behavior with a very low likelihood of becoming an active cancer. °Recommendation is for a repeat annual scan in 12 months. ° °Lung RADS 3: nodules that are probably non-concerning , includes nodules with a low likelihood of becoming an active cancer.  Recommendation is for a 6-month repeat screening scan. Often noted after an upper respiratory illness. We will be in touch to make sure you have no questions, and to schedule your 6-month scan. ° °Lung RADS 4 A: nodules with concerning findings, recommendation is most often for a follow up scan in 3 months or additional testing based on our provider's assessment of the scan. We will be in touch to make sure you have no questions and to schedule the recommended 3 month follow up scan. ° °Lung RADS 4 B:  indicates findings that are concerning. We will be in touch with you to schedule additional diagnostic testing based on our provider's  assessment of the scan. ° °Hypnosis for smoking cessation  °[employer] °[phone] ° °Acupuncture for smoking cessation  °[facility] °[phone]  °

## 2021-07-06 ENCOUNTER — Ambulatory Visit (INDEPENDENT_AMBULATORY_CARE_PROVIDER_SITE_OTHER)
Admission: RE | Admit: 2021-07-06 | Discharge: 2021-07-06 | Disposition: A | Discharge status: Home / Self Care | Payer: Medicare (Managed Care) | Source: Ambulatory Visit | Attending: Nurse Practitioner | Admitting: Nurse Practitioner

## 2021-07-06 ENCOUNTER — Other Ambulatory Visit: Payer: Self-pay

## 2021-07-06 DIAGNOSIS — Z87891 Personal history of nicotine dependence: Secondary | ICD-10-CM

## 2021-07-06 DIAGNOSIS — F17210 Nicotine dependence, cigarettes, uncomplicated: Secondary | ICD-10-CM | POA: Diagnosis not present

## 2021-07-08 ENCOUNTER — Other Ambulatory Visit: Payer: Self-pay | Admitting: Acute Care

## 2021-07-08 DIAGNOSIS — F17210 Nicotine dependence, cigarettes, uncomplicated: Secondary | ICD-10-CM

## 2021-07-08 DIAGNOSIS — Z87891 Personal history of nicotine dependence: Secondary | ICD-10-CM

## 2021-07-11 ENCOUNTER — Other Ambulatory Visit: Payer: Self-pay | Admitting: Acute Care

## 2021-07-11 DIAGNOSIS — F17200 Nicotine dependence, unspecified, uncomplicated: Secondary | ICD-10-CM

## 2021-07-11 DIAGNOSIS — Z87891 Personal history of nicotine dependence: Secondary | ICD-10-CM

## 2021-07-18 ENCOUNTER — Ambulatory Visit: Payer: Medicare (Managed Care)

## 2021-08-05 ENCOUNTER — Other Ambulatory Visit: Payer: Self-pay

## 2021-08-05 ENCOUNTER — Encounter (HOSPITAL_COMMUNITY): Payer: Self-pay

## 2021-08-05 ENCOUNTER — Ambulatory Visit (HOSPITAL_COMMUNITY)
Admission: EM | Admit: 2021-08-05 | Discharge: 2021-08-05 | Disposition: A | Discharge status: Home / Self Care | Payer: Medicare (Managed Care) | Attending: Physician Assistant | Admitting: Physician Assistant

## 2021-08-05 DIAGNOSIS — H1032 Unspecified acute conjunctivitis, left eye: Secondary | ICD-10-CM | POA: Diagnosis not present

## 2021-08-05 MED ORDER — POLYMYXIN B-TRIMETHOPRIM 10000-0.1 UNIT/ML-% OP SOLN
1.0000 [drp] | OPHTHALMIC | 0 refills | Status: AC
Start: 2021-08-05 — End: 2021-08-12

## 2021-08-05 NOTE — ED Provider Notes (Signed)
[facility]    CSN: [id number] Arrival date & time: 08/05/21  0300      History   Chief Complaint No chief complaint on file.   HPI Javier Rios is a 71 y.o. male.   Patient here today for evaluation of left eye pain, swelling, erythema that started yesterday. He reports he was outside and it was very windy and after this he started to have some itching to his eye. He notes he rubbed his eye and this morning had more redness and discomfort. He has tried benadryl and clear eyes drops without resolution of symptoms. He denies any vision changes.   The history is provided by the patient.   Past Medical History:  Diagnosis Date   Chest pain    Coronary artery disease    Status post PTCA and stenting of his right coronary artery in March, 2008. He also has a 40-50% LAD stenosis   Hypertension    LAD stenosis    MODERATE BETWEEN 50-60%   MI, old 2008   INFERIOR WALL    Patient Active Problem List   Diagnosis Date Noted   Chronic left shoulder pain 04/12/2021   Elevated blood pressure reading in office with diagnosis of hypertension 04/12/2021   Systolic murmur 10/27/2015   Hyperlipidemia 10/09/2014   COPD (chronic obstructive pulmonary disease) (HCC) 09/05/2013   Hypertension 10/12/2011   CAD (coronary artery disease) 03/29/2011    Past Surgical History:  Procedure Laterality Date   CARDIAC CATHETERIZATION  08/23/2006   EF 65%. THERE IS INFEROBASILAR AKINESIS   CARDIOVASCULAR STRESS TEST  04/21/2010   EF 51%. NO SIGNIFICANT ST SEGMENT CHANGE SUGGESTIVE OF ISCHEMIA   CORONARY ANGIOPLASTY WITH STENT PLACEMENT     STENTING OF HIS RCA       Home Medications    Prior to Admission medications   Medication Sig Start Date End Date Taking? Authorizing Provider  trimethoprim-polymyxin b (POLYTRIM) ophthalmic solution Place 1 drop into the left eye every 4 (four) hours for 7 days. 08/05/21 08/12/21 Yes Tomi Bamberger, PA-C  acetaminophen-codeine (TYLENOL #3)  300-30 MG tablet Take 1 tablet by mouth every 4 (four) hours as needed for moderate pain. 05/25/21   Claiborne Rigg, NP  albuterol (PROAIR HFA) 108 (90 Base) MCG/ACT inhaler INHALE 2 PUFFS INTO THE LUNGS EVERY 6 (SIX) HOURS AS NEEDED FOR WHEEZING OR SHORTNESS OF BREATH. 06/15/21   Claiborne Rigg, NP  amLODipine (NORVASC) 10 MG tablet Take 1 tablet (10 mg total) by mouth daily. Please make PCP appointment. 06/15/21   Claiborne Rigg, NP  aspirin 81 MG tablet Take 81 mg by mouth daily.    [provider]  carvedilol (COREG) 6.25 MG tablet Take 1 tablet (6.25 mg total) by mouth 2 (two) times daily. 06/15/21   Claiborne Rigg, NP  chlorthalidone (HYGROTON) 25 MG tablet Take 1 tablet (25 mg total) by mouth daily. 06/15/21 09/13/21  Claiborne Rigg, NP  diclofenac (VOLTAREN) 75 MG EC tablet Take 1 tablet (75 mg total) by mouth 2 (two) times daily between meals as needed. Do not start taking until finished with steroid taper 06/29/21   Cristie Hem, PA-C  ezetimibe (ZETIA) 10 MG tablet Take 1 tablet (10 mg total) by mouth daily. 06/15/21   Claiborne Rigg, NP  methocarbamol (ROBAXIN) 500 MG tablet Take 1 tablet (500 mg total) by mouth 2 (two) times daily as needed. 06/29/21   Cristie Hem, PA-C  nitroGLYCERIN (NITROSTAT) 0.4 MG  SL tablet PLACE 1 TABLET UNDER THE TOUNGE EVERY AS NEEDED FOR CHEST PAIN 12/12/19   Claiborne Rigg, NP  pravastatin (PRAVACHOL) 40 MG tablet Take 1 tablet (40 mg total) by mouth every other day. 06/15/21 09/13/21  Claiborne Rigg, NP  predniSONE (STERAPRED UNI-PAK 21 TAB) 10 MG (21) TBPK tablet Take as directed. Do not take with nsaids 06/29/21   Cristie Hem, PA-C  sildenafil (VIAGRA) 100 MG tablet TAKE 1 TABLET BY MOUTH DAILY AS NEEDED FOR ERECTILE DYSFUNCTION 06/15/21   Claiborne Rigg, NP  spironolactone (ALDACTONE) 50 MG tablet Take 1 tablet (50 mg total) by mouth daily. 06/15/21 09/13/21  Claiborne Rigg, NP    Family History Family History  Problem Relation Age  of Onset   Hypertension Mother    Hypertension Brother    Hypertension Daughter     Social History Social History   Tobacco Use   Smoking status: Every Day    Packs/day: 1.00    Years: 52.00    Pack years: 52.00    Types: Cigarettes   Smokeless tobacco: Never  Vaping Use   Vaping Use: Never used  Substance Use Topics   Alcohol use: Yes    Comment: ocassion   Drug use: Yes    Types: Marijuana     Allergies   Rosuvastatin and Atorvastatin   Review of Systems Review of Systems  Constitutional:  Negative for chills and fever.  Eyes:  Positive for discharge and redness.  Neurological:  Negative for numbness.    Physical Exam Triage Vital Signs ED Triage Vitals [08/05/21 1004]  Enc Vitals Group     BP (!) 151/84     Pulse Rate 66     Resp 18     Temp 98.7 F (37.1 C)     Temp Source Oral     SpO2 98 %     Weight      Height      Head Circumference      Peak Flow      Pain Score      Pain Loc      Pain Edu?      Excl. in GC?    No data found.  Updated Vital Signs BP (!) 151/84 (BP Location: Left Arm)    Pulse 66    Temp 98.7 F (37.1 C) (Oral)    Resp 18    SpO2 98%     Physical Exam Vitals and nursing note reviewed.  Constitutional:      General: He is not in acute distress.    Appearance: Normal appearance. He is not ill-appearing.  HENT:     Head: Normocephalic and atraumatic.  Eyes:     Extraocular Movements: Extraocular movements intact.     Pupils: Pupils are equal, round, and reactive to light.     Comments: Left conjunctiva injected, mild discharge noted, right conjunctiva WNL  Cardiovascular:     Rate and Rhythm: Normal rate.  Pulmonary:     Effort: Pulmonary effort is normal.  Neurological:     Mental Status: He is alert.  Psychiatric:        Mood and Affect: Mood normal.        Behavior: Behavior normal.        Thought Content: Thought content normal.     UC Treatments / Results  Labs (all labs ordered are listed, but only  abnormal results are displayed) Labs Reviewed - No data to display  EKG   Radiology No results found.  Procedures Procedures (including critical care time)  Medications Ordered in UC Medications - No data to display  Initial Impression / Assessment and Plan / UC Course  I have reviewed the triage vital signs and the nursing notes.  Pertinent labs & imaging results that were available during my care of the patient were reviewed by me and considered in my medical decision making (see chart for details).    Will treat to cover conjunctivitis with instruction to return if no improvement or if symptoms worsen.   Final Clinical Impressions(s) / UC Diagnoses   Final diagnoses:  Acute conjunctivitis of left eye, unspecified acute conjunctivitis type   Discharge Instructions   None    ED Prescriptions     Medication Sig Dispense Auth. Provider   trimethoprim-polymyxin b (POLYTRIM) ophthalmic solution Place 1 drop into the left eye every 4 (four) hours for 7 days. 10 mL Tomi Bamberger, PA-C      PDMP not reviewed this encounter.   Tomi Bamberger, PA-C 08/05/21 1023

## 2021-08-05 NOTE — ED Triage Notes (Addendum)
Pt presents for left eye pain, swelling  and redness.

## 2021-09-05 ENCOUNTER — Other Ambulatory Visit: Payer: Self-pay | Admitting: Nurse Practitioner

## 2021-09-05 DIAGNOSIS — E782 Mixed hyperlipidemia: Secondary | ICD-10-CM

## 2021-09-06 NOTE — Telephone Encounter (Signed)
Requested Prescriptions  ?Pending Prescriptions Disp Refills  ?? pravastatin (PRAVACHOL) 40 MG tablet [Pharmacy Med Name: PRAVASTATIN SODIUM 40 MG TAB] 45 tablet 3  ?  Sig: TAKE 1 TABLET BY MOUTH EVERY OTHER DAY  ?  ? Cardiovascular:  Antilipid - Statins Failed - 09/05/2021  2:17 AM  ?  ?  Failed - Lipid Panel in normal range within the last 12 months  ?  Cholesterol, Total  ?Date Value Ref Range Status  ?08/27/2020 206 (H) 100 - 199 mg/dL Final  ? ?LDL Chol Calc (NIH)  ?Date Value Ref Range Status  ?08/27/2020 135 (H) 0 - 99 mg/dL Final  ? ?Direct LDL  ?Date Value Ref Range Status  ?01/20/2013 137.2 mg/dL Final  ?  Comment:  ?  Optimal:  <100 mg/dLNear or Above Optimal:  100-129 mg/dLBorderline High:  130-159 mg/dLHigh:  160-189 mg/dLVery High:  >190 mg/dL  ? ?HDL  ?Date Value Ref Range Status  ?08/27/2020 45 >39 mg/dL Final  ? ?Triglycerides  ?Date Value Ref Range Status  ?08/27/2020 142 0 - 149 mg/dL Final  ? ?  ?  ?  Passed - Patient is not pregnant  ?  ?  Passed - Valid encounter within last 12 months  ?  Recent Outpatient Visits   ?      ? 2 months ago Primary hypertension  ? [facility] [place], [place] W, NP  ? 4 months ago Acute pain of left shoulder  ? [facility] [place], Cari S, [place]  ? 6 months ago Primary hypertension  ? [facility] [place], Shea Stakes, NP  ? 1 year ago Primary hypertension  ? [facility] [place], Shea Stakes, NP  ? 1 year ago Essential hypertension  ? [facility] [place], Cornelius Moras, RPH-CPP  ?  ?  ?Future Appointments   ?        ? In 1 week Claiborne Rigg, NP [facility]  ?  ? ?  ?  ?  ? ?

## 2021-09-14 ENCOUNTER — Ambulatory Visit: Payer: Medicare (Managed Care) | Admitting: Nurse Practitioner

## 2021-09-21 ENCOUNTER — Telehealth: Payer: Self-pay | Admitting: Nurse Practitioner

## 2021-09-21 ENCOUNTER — Ambulatory Visit: Payer: Self-pay | Admitting: *Deleted

## 2021-09-21 NOTE — Telephone Encounter (Addendum)
Patient was ok with being mailed list of dental resources.   ? ?

## 2021-09-21 NOTE — Telephone Encounter (Signed)
?  Chief Complaint: tooth decay ?Symptoms: tooth decay- partial is not fitting correctly, some pain at times ?Frequency: chronic- worse over last 2 weeks ?Pertinent Negatives: Patient denies active gum/tooth bleeding ?Disposition: [] ED /[] Urgent Care (no appt availability in office) / [] Appointment(In office/virtual)/ []  [facility]/ [] Home Care/ [] Refused Recommended Disposition /[] [facility]/ [x]  Follow-up with PCP ?Additional Notes: Patient is requesting dental referral- advised would send message to provider- if symptoms should get worse- pain increases, gum swelling/pain- go to ED.  ?

## 2021-09-21 NOTE — Telephone Encounter (Unsigned)
Copied from CRM [id number]. Topic: Referral - Request for Referral ?>> Sep 21, 2021 10:21 AM [name] wrote: ?Has patient seen PCP for this complaint? No.   ?*If NO, is insurance requiring patient see PCP for this issue before PCP can refer them? ?Referral for which specialty: Dentistry ?Preferred provider/office: [facility] recommended  ?Reason for referral: Has a tooth that is bothering him. He wants it pulled ?

## 2021-09-21 NOTE — Telephone Encounter (Signed)
Summary: Dental pain - seeking advice  ? Pt has a tooth that is bothering him, symptoms lasting for several weeks. Seeking advice on where to go  ? ?Best contact: [phone]   ?  ? ?Reason for Disposition ? [1] Chipped tooth AND [2] large piece missing ? ?Answer Assessment - Initial Assessment Questions ?1. MECHANISM: "How did the injury happen?"  ?    Partial attaches to tooth that is decaying ?2. ONSET: "When did the injury happen?" (e.g., minutes or hours ago)  ?    Ongoing- 2 weeks ?3. LOCATION: "What part of the tooth is injured?"  ?    Top- R side ?4. APPEARANCE: "What does the tooth look like?"  ?    Decaying- chipping ?5. BLEEDING: "Is the mouth still bleeding?" If Yes, ask: "Is it difficult to stop?"  ?    No bleeding- bleeds with brushing ?6. PAIN: "Is it painful?" If Yes, ask: "How bad is the pain?"   (Scale 1-10; or mild, moderate, severe) ?    Mild/moderate ?7. TETANUS: For any breaks in the skin, ask: "When was the last tetanus booster?" ?      ?8. OTHER SYMPTOMS: "Do you have any other symptoms?"  ?    Other problems with teeth decay in other places in mouth ?9. PREGNANCY: "Is there any chance you are pregnant?" "When was your last menstrual period?" ? ?Protocols used: Tooth Injury-A-AH ? ?

## 2021-09-21 NOTE — Telephone Encounter (Signed)
Patient has to call his insurance plan and find out who is in network and then he can schedule this himself ?

## 2021-09-21 NOTE — Telephone Encounter (Signed)
Dentist referral ?

## 2021-09-22 NOTE — Telephone Encounter (Signed)
Called pt and he is aware. 

## 2021-09-28 ENCOUNTER — Other Ambulatory Visit: Payer: Self-pay | Admitting: Physician Assistant

## 2021-10-24 ENCOUNTER — Telehealth: Payer: Self-pay | Admitting: Cardiovascular Disease

## 2021-10-24 ENCOUNTER — Telehealth: Payer: Self-pay | Admitting: Nurse Practitioner

## 2021-10-24 ENCOUNTER — Other Ambulatory Visit: Payer: Self-pay | Admitting: Nurse Practitioner

## 2021-10-24 DIAGNOSIS — N529 Male erectile dysfunction, unspecified: Secondary | ICD-10-CM

## 2021-10-24 MED ORDER — SILDENAFIL CITRATE 100 MG PO TABS: ORAL_TABLET | ORAL | 3 refills | Status: DC

## 2021-10-24 NOTE — Telephone Encounter (Signed)
Pt has been getting from primary care. Rx was written this month with refills from his PCP, but states insurance isn't paying and it's very expensive. Per pt, PCP referred to cardiology. Called and spoke to patient and informed him that he needs to contact [facility] and ask that they contact PCP that prescribed it and get it changed to the 20mg  tablets which are much cheaper. Pt can get #90 of the 20mg  sildenafil for approx $25. Pt understands and states he will call [facility]. ?

## 2021-10-24 NOTE — Telephone Encounter (Signed)
Copied from [place] [phone]. Topic: General - Other ?>> Oct 24, 2021  9:15 AM Tessa Lerner A wrote: ?Reason for CRM: Dianne with [facility] has called to share that the patient's sildenafil (VIAGRA) 100 MG tablet [id number]  prescription is not covered under their Part D ? ?Levander Campion has called to request an alternative prescription or the submission of a prior authorization for the medication  ? ?Levander Campion would like for the patient to be notified of their lack of coverage  ? ?Please contact further when possible ?

## 2021-10-24 NOTE — Telephone Encounter (Signed)
Pt c/o medication issue: ? ?1. Name of Medication:  ?sildenafil (VIAGRA) 100 MG tablet ? ?2. How are you currently taking this medication (dosage and times per day)?  ? ?3. Are you having a reaction (difficulty breathing--STAT)?  ? ?4. What is your medication issue?  ? ?Patient states he needs assistance with purchasing this medication. He inquired with his PCP first, but he states they deferred back to cardiology.  ?

## 2021-10-25 ENCOUNTER — Ambulatory Visit: Payer: Self-pay | Admitting: *Deleted

## 2021-10-25 ENCOUNTER — Other Ambulatory Visit: Payer: Self-pay | Admitting: Pharmacist

## 2021-10-25 MED ORDER — SILDENAFIL CITRATE 20 MG PO TABS: 100.0000 mg | ORAL_TABLET | Freq: Every day | ORAL | 2 refills | Status: DC | PRN

## 2021-10-25 NOTE — Telephone Encounter (Signed)
Per agent: ?"The patient has been in contact with their pharmacy and was told that in order for their prescription to be covered the dosage must be changed from sildenafil (VIAGRA) 100 MG tablet [[id number]]  ? ?To 20 MG tablets  ? ?Please contact the patient further when possible." ? ? ?Chief Complaint: Medication Dosage Change ?Symptoms: NA ?Frequency: NA ?Pertinent Negatives: Patient denies NA ?Disposition: [] ED /[] Urgent Care (no appt availability in office) / [] Appointment(In office/virtual)/ []  [facility]/ [] Home Care/ [] Refused Recommended Disposition /[] [place] [facility] [facility]/ [x]  Follow-up with PCP ?Additional Notes: Per note pt needs Viagra changed from 100mg  to 20 mg. States he went to pick up med and cost was 153.00.  States this is holding up getting his other medications. Please review. ? ? ? ?  ? ? ? ?Reason for Disposition ? [1] Prescription refill request for ESSENTIAL medicine (i.e., likelihood of harm to patient if not taken) AND [2] triager unable to refill per department policy ?   States cannot get essential meds until this med dosage is changed ? ?Answer Assessment - Initial Assessment Questions ?1. DRUG NAME: "What medicine do you need to have refilled?" ?    Sildenafil ?2. REFILLS REMAINING: "How many refills are remaining?" (Note: The label on the medicine or pill bottle will show how many refills are remaining. If there are no refills remaining, then a renewal may be needed.) ?    Needs changed to 20mg  per insurance. ? ?Protocols used: Medication Refill and Renewal Call-A-AH ? ?

## 2021-10-26 ENCOUNTER — Telehealth: Payer: Self-pay | Admitting: Nurse Practitioner

## 2021-10-26 NOTE — Telephone Encounter (Signed)
Darlene with [facility] called with patient requesting that the authorization for Rx be sent to them so patient does not have to pay for the Rx. ? ?Medication Refill - Medication: sildenafil (REVATIO) 20 MG tablet ? ?Has the patient contacted their pharmacy? Yes.   ? ?Preferred Pharmacy (with phone number or street name): [facility] fax# [phone] ? ?Has the patient been seen for an appointment in the last year OR does the patient have an upcoming appointment? Yes.   ? ?Agent: Please be advised that RX refills may take up to 3 business days. We ask that you follow-up with your pharmacy. ? ?

## 2021-10-27 NOTE — Telephone Encounter (Signed)
Rx was sent 10/2021

## 2021-10-31 NOTE — Telephone Encounter (Signed)
Javier Rios. Are we able to start a PA for this pt's 20 mg sildenafil? I do not know if it will be approved. Someone from his insurance told him sildenafil 20 mg would be approved for him to use vs the 100 mg tablet. I sent in the 20 mg tablet. Of note, this is labeled for pulmonary HTN, but this pt is using it for ED.

## 2021-10-31 NOTE — Telephone Encounter (Signed)
Janine Limbo with pt's insurance is calling in to follow up on having PA completed for pt's medication. They are aware that Rx has been sent in to pharmacy.    Please assist with starting a PA for this pt's medication sildenafil (REVATIO) 20 MG tablet. Pt is completely out.    [facility]  Phone: [phone] fax# [phone]

## 2021-11-02 ENCOUNTER — Other Ambulatory Visit: Payer: Self-pay | Admitting: Pharmacist

## 2021-11-02 DIAGNOSIS — I10 Essential (primary) hypertension: Secondary | ICD-10-CM

## 2021-11-02 MED ORDER — CARVEDILOL 6.25 MG PO TABS: 6.2500 mg | ORAL_TABLET | Freq: Two times a day (BID) | ORAL | 0 refills | Status: AC

## 2021-11-02 NOTE — Telephone Encounter (Signed)
Call placed to patient and patient was informed that he can use [facility] [employer] to help pay for medication. Patient states that he has never had to pay for the medication, then he states that he has changed his insurance, I then informed patient that may be the reason that he has a co pay now. Patient states that he will reach out to his insurance company tomorrow.

## 2021-11-02 NOTE — Telephone Encounter (Signed)
Pt called about his refill for sildenafil (REVATIO) 20 MG tabs/  I spoke with the pharmacy and they stated they spoke with pts insurance and they were advised the the PA was approved but when the pharmacy tried to fill RX it still was coming up not covered / the insurance was able to override to let the pt get a few tabs but there is still an unknown issue with the PA for this RX / please advise asap / pharmacy is saying this RX is still not being covered

## 2021-11-03 ENCOUNTER — Other Ambulatory Visit: Payer: Self-pay

## 2021-11-04 ENCOUNTER — Other Ambulatory Visit: Payer: Self-pay

## 2021-11-30 ENCOUNTER — Ambulatory Visit
Admission: RE | Admit: 2021-11-30 | Discharge: 2021-11-30 | Disposition: A | Discharge status: Home / Self Care | Payer: Medicare (Managed Care) | Source: Ambulatory Visit | Attending: Internal Medicine | Admitting: Internal Medicine

## 2021-11-30 VITALS — BP 149/91 | HR 69 | Temp 97.9°F | Resp 18

## 2021-11-30 DIAGNOSIS — S39012A Strain of muscle, fascia and tendon of lower back, initial encounter: Secondary | ICD-10-CM | POA: Diagnosis not present

## 2021-11-30 DIAGNOSIS — M545 Low back pain, unspecified: Secondary | ICD-10-CM

## 2021-11-30 MED ORDER — METHOCARBAMOL 500 MG PO TABS: 500.0000 mg | ORAL_TABLET | Freq: Two times a day (BID) | ORAL | 0 refills | Status: DC | PRN

## 2021-11-30 NOTE — ED Provider Notes (Signed)
[facility]    CSN: [id number] Arrival date & time: 11/30/21  1001      History   Chief Complaint Chief Complaint  Patient presents with   Back Pain    HPI Javier Rios is a 71 y.o. male.   Patient presents with right-sided lower back pain that has been present for approximately 4 days.  Denies any obvious injury but reports that he was playing outside with his grandchildren when patient started directly after.  Pain is present in the right lower back and does not radiate.  Movement exacerbates pain.  Denies any numbness or tingling.  Denies urinary frequency, urinary or bowel continence, saddle anesthesia.  Patient has taken ibuprofen with some improvement.   Back Pain   Past Medical History:  Diagnosis Date   Chest pain    Coronary artery disease    Status post PTCA and stenting of his right coronary artery in March, 2008. He also has a 40-50% LAD stenosis   Hypertension    LAD stenosis    MODERATE BETWEEN 50-60%   MI, old 2008   INFERIOR WALL    Patient Active Problem List   Diagnosis Date Noted   Chronic left shoulder pain 04/12/2021   Elevated blood pressure reading in office with diagnosis of hypertension 04/12/2021   Systolic murmur 10/27/2015   Hyperlipidemia 10/09/2014   COPD (chronic obstructive pulmonary disease) (HCC) 09/05/2013   Hypertension 10/12/2011   CAD (coronary artery disease) 03/29/2011    Past Surgical History:  Procedure Laterality Date   CARDIAC CATHETERIZATION  08/23/2006   EF 65%. THERE IS INFEROBASILAR AKINESIS   CARDIOVASCULAR STRESS TEST  04/21/2010   EF 51%. NO SIGNIFICANT ST SEGMENT CHANGE SUGGESTIVE OF ISCHEMIA   CORONARY ANGIOPLASTY WITH STENT PLACEMENT     STENTING OF HIS RCA       Home Medications    Prior to Admission medications   Medication Sig Start Date End Date Taking? Authorizing Provider  methocarbamol (ROBAXIN) 500 MG tablet Take 1 tablet (500 mg total) by mouth 2 (two) times daily as needed for  muscle spasms. 11/30/21  Yes Maynard David, Rolly Salter E, FNP  acetaminophen-codeine (TYLENOL #3) 300-30 MG tablet Take 1 tablet by mouth every 4 (four) hours as needed for moderate pain. 05/25/21   Claiborne Rigg, NP  albuterol (PROAIR HFA) 108 (90 Base) MCG/ACT inhaler INHALE 2 PUFFS INTO THE LUNGS EVERY 6 (SIX) HOURS AS NEEDED FOR WHEEZING OR SHORTNESS OF BREATH. 06/15/21   Claiborne Rigg, NP  amLODipine (NORVASC) 10 MG tablet Take 1 tablet (10 mg total) by mouth daily. Please make PCP appointment. 06/15/21   Claiborne Rigg, NP  aspirin 81 MG tablet Take 81 mg by mouth daily.    [provider]  carvedilol (COREG) 6.25 MG tablet Take 1 tablet (6.25 mg total) by mouth 2 (two) times daily. 11/02/21   Hoy Register, MD  chlorthalidone (HYGROTON) 25 MG tablet Take 1 tablet (25 mg total) by mouth daily. 06/15/21 09/13/21  Claiborne Rigg, NP  diclofenac (VOLTAREN) 75 MG EC tablet Take 1 tablet (75 mg total) by mouth 2 (two) times daily between meals as needed. Do not start taking until finished with steroid taper 06/29/21   Cristie Hem, PA-C  ezetimibe (ZETIA) 10 MG tablet Take 1 tablet (10 mg total) by mouth daily. 06/15/21   Claiborne Rigg, NP  nitroGLYCERIN (NITROSTAT) 0.4 MG SL tablet PLACE 1 TABLET UNDER THE TOUNGE EVERY AS NEEDED FOR CHEST PAIN  12/12/19   Claiborne Rigg, NP  pravastatin (PRAVACHOL) 40 MG tablet TAKE 1 TABLET BY MOUTH EVERY OTHER DAY 09/06/21   Claiborne Rigg, NP  predniSONE (STERAPRED UNI-PAK 21 TAB) 10 MG (21) TBPK tablet Take as directed. Do not take with nsaids 06/29/21   Cristie Hem, PA-C  sildenafil (REVATIO) 20 MG tablet Take 5 tablets (100 mg total) by mouth daily as needed. For erectile dysfunction. 10/25/21   Hoy Register, MD  spironolactone (ALDACTONE) 50 MG tablet Take 1 tablet (50 mg total) by mouth daily. 06/15/21 09/13/21  Claiborne Rigg, NP    Family History Family History  Problem Relation Age of Onset   Hypertension Mother    Hypertension Brother     Hypertension Daughter     Social History Social History   Tobacco Use   Smoking status: Every Day    Packs/day: 1.00    Years: 52.00    Total pack years: 52.00    Types: Cigarettes   Smokeless tobacco: Never  Vaping Use   Vaping Use: Never used  Substance Use Topics   Alcohol use: Yes    Comment: ocassion   Drug use: Yes    Types: Marijuana     Allergies   Rosuvastatin and Atorvastatin   Review of Systems Review of Systems Per HPI  Physical Exam Triage Vital Signs ED Triage Vitals  Enc Vitals Group     BP 11/30/21 1025 (!) 149/91     Pulse Rate 11/30/21 1025 69     Resp 11/30/21 1025 18     Temp 11/30/21 1025 97.9 F (36.6 C)     Temp Source 11/30/21 1025 Oral     SpO2 11/30/21 1025 98 %     Weight --      Height --      Head Circumference --      Peak Flow --      Pain Score 11/30/21 1024 8     Pain Loc --      Pain Edu? --      Excl. in GC? --    No data found.  Updated Vital Signs BP (!) 149/91 (BP Location: Right Arm)   Pulse 69   Temp 97.9 F (36.6 C) (Oral)   Resp 18   SpO2 98%   Visual Acuity Right Eye Distance:   Left Eye Distance:   Bilateral Distance:    Right Eye Near:   Left Eye Near:    Bilateral Near:     Physical Exam Constitutional:      General: He is not in acute distress.    Appearance: Normal appearance. He is not toxic-appearing or diaphoretic.  HENT:     Head: Normocephalic and atraumatic.  Eyes:     Extraocular Movements: Extraocular movements intact.     Conjunctiva/sclera: Conjunctivae normal.  Pulmonary:     Effort: Pulmonary effort is normal.  Musculoskeletal:     Cervical back: Normal.     Thoracic back: Normal.       Back:     Comments: No tenderness to palpation.  Patient reports movement exacerbates pain.  No obvious swelling, warmth, discoloration, lacerations, abrasions noted.  No direct spinal tenderness, crepitus, step-off.  Neurological:     General: No focal deficit present.     Mental  Status: He is alert and oriented to person, place, and time. Mental status is at baseline.     Deep Tendon Reflexes: Reflexes are normal and symmetric.  Psychiatric:  Mood and Affect: Mood normal.        Behavior: Behavior normal.        Thought Content: Thought content normal.        Judgment: Judgment normal.      UC Treatments / Results  Labs (all labs ordered are listed, but only abnormal results are displayed) Labs Reviewed - No data to display  EKG   Radiology No results found.  Procedures Procedures (including critical care time)  Medications Ordered in UC Medications - No data to display  Initial Impression / Assessment and Plan / UC Course  I have reviewed the triage vital signs and the nursing notes.  Pertinent labs & imaging results that were available during my care of the patient were reviewed by me and considered in my medical decision making (see chart for details).     Suspect lumbar strain.  Do not think imaging is necessary given no obvious injury or direct spinal tenderness.  Will treat with muscle relaxers as patient has taken this before and tolerated well.  Advised patient that it can cause drowsiness.  Patient to alternate ice and heat to affected area.  Discussed supportive care and over-the-counter pain relievers with patient as well.  Patient to follow-up if symptoms persist or worsen.  Patient verbalized understanding and was agreeable with plan. Final Clinical Impressions(s) / UC Diagnoses   Final diagnoses:  Strain of lumbar region, initial encounter  Acute right-sided low back pain without sciatica     Discharge Instructions      It appears that you have a muscle strain of your lower back.  This is being treated with a muscle relaxer.  Please be advised that this can cause drowsiness.  Also alternate ice and heat to affected area.  Follow-up if symptoms persist or worsen.    ED Prescriptions     Medication Sig Dispense Auth.  Provider   methocarbamol (ROBAXIN) 500 MG tablet Take 1 tablet (500 mg total) by mouth 2 (two) times daily as needed for muscle spasms. 20 tablet [place], Acie Fredrickson, [place]      PDMP not reviewed this encounter.   Gustavus Bryant, [place] 11/30/21 1108

## 2021-11-30 NOTE — ED Triage Notes (Signed)
Pt present right side back pain. Pt states four days ago he was playing outside and possible pulled a  muscle. Pt states it hurts to sit down.

## 2021-11-30 NOTE — Discharge Instructions (Signed)
It appears that you have a muscle strain of your lower back.  This is being treated with a muscle relaxer.  Please be advised that this can cause drowsiness.  Also alternate ice and heat to affected area.  Follow-up if symptoms persist or worsen.

## 2022-01-31 ENCOUNTER — Ambulatory Visit: Payer: Medicare (Managed Care) | Attending: Nurse Practitioner

## 2022-01-31 DIAGNOSIS — Z1211 Encounter for screening for malignant neoplasm of colon: Secondary | ICD-10-CM

## 2022-01-31 DIAGNOSIS — Z0000 Encounter for general adult medical examination without abnormal findings: Secondary | ICD-10-CM | POA: Diagnosis not present

## 2022-01-31 NOTE — Progress Notes (Addendum)
Subjective:   Javier Rios is a 71 y.o. male who presents for Medicare Annual/Subsequent preventive examination.  Review of Systems     I connected with [name] on 01/31/2022 at 10:48 am by telephone and verified that I am speaking with the correct person using two identifiers. I discussed the limitations, risks, security and privacy concerns of performing an evaluation and management service by telephone and the availability of in person appointments. I also discussed with the patient that there may be a patient responsible charge related to this service. The patient expressed understanding and agreed to proceed.   Patient location: Home  My Location: [facility] Persons on the telephone call: Myself and Patient      Cardiac Risk Factors include: none     Objective:    There were no vitals filed for this visit. There is no height or weight on file to calculate BMI.     01/31/2022   10:54 AM 12/24/2020    8:27 AM 07/15/2015    9:45 AM 06/17/2015    2:32 AM  Advanced Directives  Does Patient Have a Medical Advance Directive? No No No No  Would patient like information on creating a medical advance directive? Yes (ED - Information included in AVS) No - Patient declined      Current Medications (verified) Outpatient Encounter Medications as of 01/31/2022  Medication Sig   acetaminophen-codeine (TYLENOL #3) 300-30 MG tablet Take 1 tablet by mouth every 4 (four) hours as needed for moderate pain.   albuterol (PROAIR HFA) 108 (90 Base) MCG/ACT inhaler INHALE 2 PUFFS INTO THE LUNGS EVERY 6 (SIX) HOURS AS NEEDED FOR WHEEZING OR SHORTNESS OF BREATH.   amLODipine (NORVASC) 10 MG tablet Take 1 tablet (10 mg total) by mouth daily. Please make PCP appointment.   aspirin 81 MG tablet Take 81 mg by mouth daily.   carvedilol (COREG) 6.25 MG tablet Take 1 tablet (6.25 mg total) by mouth 2 (two) times daily.   diclofenac (VOLTAREN) 75 MG EC tablet Take 1 tablet (75 mg total) by  mouth 2 (two) times daily between meals as needed. Do not start taking until finished with steroid taper   ezetimibe (ZETIA) 10 MG tablet Take 1 tablet (10 mg total) by mouth daily.   methocarbamol (ROBAXIN) 500 MG tablet Take 1 tablet (500 mg total) by mouth 2 (two) times daily as needed for muscle spasms.   nitroGLYCERIN (NITROSTAT) 0.4 MG SL tablet PLACE 1 TABLET UNDER THE TOUNGE EVERY AS NEEDED FOR CHEST PAIN   sildenafil (REVATIO) 20 MG tablet Take 5 tablets (100 mg total) by mouth daily as needed. For erectile dysfunction.   chlorthalidone (HYGROTON) 25 MG tablet Take 1 tablet (25 mg total) by mouth daily.   pravastatin (PRAVACHOL) 40 MG tablet TAKE 1 TABLET BY MOUTH EVERY OTHER DAY (Patient not taking: Reported on 01/31/2022)   predniSONE (STERAPRED UNI-PAK 21 TAB) 10 MG (21) TBPK tablet Take as directed. Do not take with nsaids (Patient not taking: Reported on 01/31/2022)   spironolactone (ALDACTONE) 50 MG tablet Take 1 tablet (50 mg total) by mouth daily.   No facility-administered encounter medications on file as of 01/31/2022.    Allergies (verified) Rosuvastatin and Atorvastatin   History: Past Medical History:  Diagnosis Date   Chest pain    Coronary artery disease    Status post PTCA and stenting of his right coronary artery in March, 2008. He also has a 40-50% LAD stenosis   Hypertension  LAD stenosis    MODERATE BETWEEN 50-60%   MI, old 2008   INFERIOR WALL   Past Surgical History:  Procedure Laterality Date   CARDIAC CATHETERIZATION  08/23/2006   EF 65%. THERE IS INFEROBASILAR AKINESIS   CARDIOVASCULAR STRESS TEST  04/21/2010   EF 51%. NO SIGNIFICANT ST SEGMENT CHANGE SUGGESTIVE OF ISCHEMIA   CORONARY ANGIOPLASTY WITH STENT PLACEMENT     STENTING OF HIS RCA   Family History  Problem Relation Age of Onset   Hypertension Mother    Hypertension Brother    Hypertension Daughter    Social History   Socioeconomic History   Marital status: Divorced     Spouse name: Not on file   Number of children: Not on file   Years of education: Not on file   Highest education level: Not on file  Occupational History   Not on file  Tobacco Use   Smoking status: Every Day    Packs/day: 1.00    Years: 52.00    Total pack years: 52.00    Types: Cigarettes   Smokeless tobacco: Never  Vaping Use   Vaping Use: Never used  Substance and Sexual Activity   Alcohol use: Yes    Comment: ocassion   Drug use: Yes    Types: Marijuana   Sexual activity: Yes  Other Topics Concern   Not on file  Social History Narrative   Not on file   Social Determinants of Health   Financial Resource Strain: Medium Risk (12/24/2020)   Overall Financial Resource Strain (CARDIA)    Difficulty of Paying Living Expenses: Somewhat hard  Food Insecurity: Food Insecurity Present (12/24/2020)   Hunger Vital Sign    Worried About Running Out of Food in the Last Year: Sometimes true    Ran Out of Food in the Last Year: Sometimes true  Transportation Needs: No Transportation Needs (12/24/2020)   PRAPARE - Administrator, Civil Service (Medical): No    Lack of Transportation (Non-Medical): No  Physical Activity: Insufficiently Active (12/24/2020)   Exercise Vital Sign    Days of Exercise per Week: 3 days    Minutes of Exercise per Session: 30 min  Stress: No Stress Concern Present (12/24/2020)   [employer] of Occupational Health - Occupational Stress Questionnaire    Feeling of Stress : Not at all  Social Connections: Moderately Integrated (12/24/2020)   Social Connection and Isolation Panel [NHANES]    Frequency of Communication with Friends and Family: More than three times a week    Frequency of Social Gatherings with Friends and Family: Once a week    Attends Religious Services: 1 to 4 times per year    Active Member of [employer] or Organizations: Yes    Attends Banker Meetings: 1 to 4 times per year    Marital Status: Divorced    Tobacco  Counseling Not ready to quit  Clinical Intake:  Pre-visit preparation completed: No  Pain : No/denies pain     Nutritional Risks: None Diabetes: No  How often do you need to have someone help you when you read instructions, pamphlets, or other written materials from your doctor or pharmacy?: 1 - Never  Diabetic?No  Interpreter Needed?: No      Activities of Daily Living    01/31/2022   10:54 AM  In your present state of health, do you have any difficulty performing the following activities:  Hearing? 0  Vision? 0  Difficulty concentrating or making  decisions? 0  Walking or climbing stairs? 0  Dressing or bathing? 0  Doing errands, shopping? 0  Preparing Food and eating ? N  Using the Toilet? N  In the past six months, have you accidently leaked urine? N  Do you have problems with loss of bowel control? N  Managing your Medications? N  Managing your Finances? N  Housekeeping or managing your Housekeeping? N    Patient Care Team: Claiborne RiggFleming, Zelda W, NP as PCP - General (Nurse Practitioner) Nahser, Deloris PingPhilip J, MD as PCP - Cardiology (Cardiology)  Indicate any recent Medical Services you may have received from other than [facility] providers in the past year (date may be approximate).     Assessment:   This is a routine wellness examination for Javier Rios.  Hearing/Vision screen No results found.  Dietary issues and exercise activities discussed: Current Exercise Habits: The patient does not participate in regular exercise at present, Exercise limited by: orthopedic condition(s)   Goals Addressed   None    Depression Screen    01/31/2022   10:54 AM 04/12/2021    9:29 AM 12/24/2020    8:19 AM 08/27/2020    9:30 AM 12/12/2019    2:03 PM  PHQ 2/9 Scores  PHQ - 2 Score 0 0 0 0 0  PHQ- 9 Score     0    Fall Risk    01/31/2022   10:54 AM 12/24/2020    8:28 AM 08/27/2020    9:30 AM  Fall Risk   Falls in the past year? 0 0 0  Number falls in past yr: 0 0 0  Injury with  Fall? 0 0 0  Risk for fall due to : No Fall Risks No Fall Risks   Follow up Education provided Education provided     FALL RISK PREVENTION PERTAINING TO THE HOME:  Any stairs in or around the home? Yes  If so, are there any without handrails? No  Home free of loose throw rugs in walkways, pet beds, electrical cords, etc? Yes  Adequate lighting in your home to reduce risk of falls? Yes   ASSISTIVE DEVICES UTILIZED TO PREVENT FALLS:  Life alert? No  Use of a cane, walker or w/c? No  Grab bars in the bathroom? No  Shower chair or bench in shower? No  Elevated toilet seat or a handicapped toilet? No   TIMED UP AND GO:  Was the test performed? No .  Length of time to ambulate 10 feet: 0  sec.   Gait slow and steady without use of assistive device  Cognitive Function:    01/31/2022   10:58 AM 12/24/2020    8:30 AM  MMSE - Mini Mental State Exam  Orientation to time 5 5  Orientation to Place 5 5  Registration 3 3  Attention/ Calculation 5 5  Recall 3 3  Language- name 2 objects 2 2  Language- repeat 1 1  Language- follow 3 step command 3 3  Language- read & follow direction 1 1  Write a sentence 1 1  Copy design 1 1  Total score 30 30        01/31/2022   10:59 AM  6CIT Screen  What Year? 0 points  What month? 0 points  What time? 0 points  Count back from 20 0 points  Months in reverse 0 points  Repeat phrase 0 points  Total Score 0 points    Immunizations  There is no immunization history on  file for this patient.  TDAP status: Due, Education has been provided regarding the importance of this vaccine. Advised may receive this vaccine at local pharmacy or Health Dept. Aware to provide a copy of the vaccination record if obtained from local pharmacy or Health Dept. Verbalized acceptance and understanding.  Flu Vaccine status: Declined, Education has been provided regarding the importance of this vaccine but patient still declined. Advised may receive this  vaccine at local pharmacy or Health Dept. Aware to provide a copy of the vaccination record if obtained from local pharmacy or Health Dept. Verbalized acceptance and understanding.  Pneumococcal vaccine status: Declined,  Education has been provided regarding the importance of this vaccine but patient still declined. Advised may receive this vaccine at local pharmacy or Health Dept. Aware to provide a copy of the vaccination record if obtained from local pharmacy or Health Dept. Verbalized acceptance and understanding.   Covid-19 vaccine status: Declined, Education has been provided regarding the importance of this vaccine but patient still declined. Advised may receive this vaccine at local pharmacy or Health Dept.or vaccine clinic. Aware to provide a copy of the vaccination record if obtained from local pharmacy or Health Dept. Verbalized acceptance and understanding.  Qualifies for Shingles Vaccine? Yes   Zostavax completed No   Shingrix Completed?: No.    Education has been provided regarding the importance of this vaccine. Patient has been advised to call insurance company to determine out of pocket expense if they have not yet received this vaccine. Advised may also receive vaccine at local pharmacy or Health Dept. Verbalized acceptance and understanding.  Screening Tests Health Maintenance  Topic Date Due   COVID-19 Vaccine (1) Never done   TETANUS/TDAP  Never done   Zoster Vaccines- Shingrix (1 of 2) Never done   INFLUENZA VACCINE  01/10/2022   Pneumonia Vaccine 60+ Years old (1 - PCV) 06/15/2022 (Originally 10/31/1956)   Hepatitis C Screening  Completed   HPV VACCINES  Aged Out   COLONOSCOPY (Pts 45-65yrs Insurance coverage will need to be confirmed)  Discontinued    Health Maintenance  Health Maintenance Due  Topic Date Due   COVID-19 Vaccine (1) Never done   TETANUS/TDAP  Never done   Zoster Vaccines- Shingrix (1 of 2) Never done   INFLUENZA VACCINE  01/10/2022     Colorectal cancer screening: Referral to GI placed 01/31/2022. Pt aware the office will call re: appt.  Lung Cancer Screening: (Low Dose CT Chest recommended if Age 50-80 years, 30 pack-year currently smoking OR have quit w/in 15years.) does qualify.   Lung Cancer Screening Referral: done on 07/06/2021  Additional Screening:  Hepatitis C Screening: does qualify; Completed 08/27/2021  Vision Screening: Recommended annual ophthalmology exams for early detection of glaucoma and other disorders of the eye. Is the patient up to date with their annual eye exam?  No  Who is the provider or what is the name of the office in which the patient attends annual eye exams? N/A If pt is not established with a provider, would they like to be referred to a provider to establish care? No .   Dental Screening: Recommended annual dental exams for proper oral hygiene  Community Resource Referral / Chronic Care Management: CRR required this visit?  No   CCM required this visit?  No      Plan:     I have personally reviewed and noted the following in the patient's chart:   Medical and social history Use of alcohol, tobacco  or illicit drugs  Current medications and supplements including opioid prescriptions. Patient is not currently taking opioid prescriptions. Functional ability and status Nutritional status Physical activity Advanced directives List of other physicians Hospitalizations, surgeries, and ER visits in previous 12 months Vitals Screenings to include cognitive, depression, and falls Referrals and appointments  In addition, I have reviewed and discussed with patient certain preventive protocols, quality metrics, and best practice recommendations. A written personalized care plan for preventive services as well as general preventive health recommendations were provided to patient.     Ronette Deter, CMA   01/31/2022   Nurse Notes: I spent 30 minutes on this telephone  encounter  AVS mailed to patient

## 2022-01-31 NOTE — Patient Instructions (Signed)
[name] , Thank you for taking time to come for your Medicare Wellness Visit. I appreciate your ongoing commitment to your health goals. Please review the following plan we discussed and let me know if I can assist you in the future.   These are the goals we discussed:  Goals   None     This is a list of the screening recommended for you and due dates:  Health Maintenance  Topic Date Due   Flu Shot  01/10/2022   COVID-19 Vaccine (1) 02/16/2022*   Zoster (Shingles) Vaccine (1 of 2) 05/03/2022*   Pneumonia Vaccine (1 - PCV) 06/15/2022*   Tetanus Vaccine  02/01/2023*   Hepatitis C Screening: USPSTF Recommendation to screen - Ages 18-79 yo.  Completed   HPV Vaccine  Aged Out   Colon Cancer Screening  Discontinued  *Topic was postponed. The date shown is not the original due date.  Health Maintenance After Age 42 After age 33, you are at a higher risk for certain long-term diseases and infections as well as injuries from falls. Falls are a major cause of broken bones and head injuries in people who are older than age 41. Getting regular preventive care can help to keep you healthy and well. Preventive care includes getting regular testing and making lifestyle changes as recommended by your health care provider. Talk with your health care provider about: Which screenings and tests you should have. A screening is a test that checks for a disease when you have no symptoms. A diet and exercise plan that is right for you. What should I know about screenings and tests to prevent falls? Screening and testing are the best ways to find a health problem early. Early diagnosis and treatment give you the best chance of managing medical conditions that are common after age 63. Certain conditions and lifestyle choices may make you more likely to have a fall. Your health care provider may recommend: Regular vision checks. Poor vision and conditions such as cataracts can make you more likely to have a  fall. If you wear glasses, make sure to get your prescription updated if your vision changes. Medicine review. Work with your health care provider to regularly review all of the medicines you are taking, including over-the-counter medicines. Ask your health care provider about any side effects that may make you more likely to have a fall. Tell your health care provider if any medicines that you take make you feel dizzy or sleepy. Strength and balance checks. Your health care provider may recommend certain tests to check your strength and balance while standing, walking, or changing positions. Foot health exam. Foot pain and numbness, as well as not wearing proper footwear, can make you more likely to have a fall. Screenings, including: Osteoporosis screening. Osteoporosis is a condition that causes the bones to get weaker and break more easily. Blood pressure screening. Blood pressure changes and medicines to control blood pressure can make you feel dizzy. Depression screening. You may be more likely to have a fall if you have a fear of falling, feel depressed, or feel unable to do activities that you used to do. Alcohol use screening. Using too much alcohol can affect your balance and may make you more likely to have a fall. Follow these instructions at home: Lifestyle Do not drink alcohol if: Your health care provider tells you not to drink. If you drink alcohol: Limit how much you have to: 0-1 drink a day for women. 0-2 drinks a  day for men. Know how much alcohol is in your drink. In the [place] one drink equals one 12 oz bottle of beer (355 mL), one 5 oz glass of wine (148 mL), or one 1 oz glass of hard liquor (44 mL). Do not use any products that contain nicotine or tobacco. These products include cigarettes, chewing tobacco, and vaping devices, such as e-cigarettes. If you need help quitting, ask your health care provider. Activity  Follow a regular exercise program to stay fit. This will  help you maintain your balance. Ask your health care provider what types of exercise are appropriate for you. If you need a cane or walker, use it as recommended by your health care provider. Wear supportive shoes that have nonskid soles. Safety  Remove any tripping hazards, such as rugs, cords, and clutter. Install safety equipment such as grab bars in bathrooms and safety rails on stairs. Keep rooms and walkways well-lit. General instructions Talk with your health care provider about your risks for falling. Tell your health care provider if: You fall. Be sure to tell your health care provider about all falls, even ones that seem minor. You feel dizzy, tiredness (fatigue), or off-balance. Take over-the-counter and prescription medicines only as told by your health care provider. These include supplements. Eat a healthy diet and maintain a healthy weight. A healthy diet includes low-fat dairy products, low-fat (lean) meats, and fiber from whole grains, beans, and lots of fruits and vegetables. Stay current with your vaccines. Schedule regular health, dental, and eye exams. Summary Having a healthy lifestyle and getting preventive care can help to protect your health and wellness after age 37. Screening and testing are the best way to find a health problem early and help you avoid having a fall. Early diagnosis and treatment give you the best chance for managing medical conditions that are more common for people who are older than age 88. Falls are a major cause of broken bones and head injuries in people who are older than age 40. Take precautions to prevent a fall at home. Work with your health care provider to learn what changes you can make to improve your health and wellness and to prevent falls. This information is not intended to replace advice given to you by your health care provider. Make sure you discuss any questions you have with your health care provider. Document Revised:  10/18/2020 Document Reviewed: 10/18/2020 Elsevier Patient Education  [place] for Falls Each year, millions of people have serious injuries from falls. It is important to understand your risk for falling. Talk with your health care provider about your risk and what you can do to lower it. There are actions you can take at home to lower your risk and prevent falls. If you do have a serious fall, make sure to tell your health care provider. Falling once raises your risk of falling again. How can falls affect me? Serious injuries from falls are common. These include: Broken bones, such as hip fractures. Head injuries, such as traumatic brain injuries (TBI) or concussion. A fear of falling can cause you to avoid activities and stay at home. This can make your muscles weaker and actually raise your risk for a fall. What can increase my risk? There are a number of risk factors that increase your risk for falling. The more risk factors you have, the higher your risk of falling. Serious injuries from a fall happen most often to people older than age  43. Children and young adults ages 70-29 are also at higher risk. Common risk factors include: Weakness in the lower body. Lack (deficiency) of vitamin D. Being generally weak or confused due to long-term (chronic) illness. Dizziness or balance problems. Poor vision. Medicines that cause dizziness or drowsiness. These can include medicines for your blood pressure, heart, anxiety, insomnia, or edema, as well as pain medicines and muscle relaxants. Other risk factors include: Drinking alcohol. Having had a fall in the past. Having depression. Having foot pain or wearing improper footwear. Working at a dangerous job. Having any of the following in your home: Tripping hazards, such as floor clutter or loose rugs. Poor lighting. Pets. Having dementia or memory loss. What actions can I take to lower my risk of  falling?     Physical activity Maintain physical fitness. Do strength and balance exercises. Consider taking a regular class to build strength and balance. Yoga and tai chi are good options. Vision Have your eyes checked every year and your vision prescription updated as needed. Walking aids and footwear Wear nonskid shoes. Do not wear high heels. Do not walk around the house in socks or slippers. Use a cane or walker as told by your health care provider. Home safety Attach secure railings on both sides of your stairs. Install grab bars for your tub, shower, and toilet. Use a bath mat in your tub or shower. Use good lighting in all rooms. Keep a flashlight near your bed. Make sure there is a clear path from your bed to the bathroom. Use night-lights. Do not use throw rugs. Make sure all carpeting is taped or tacked down securely. Remove all clutter from walkways and stairways, including extension cords. Repair uneven or broken steps. Avoid walking on icy or slippery surfaces. Walk on the grass instead of on icy or slick sidewalks. Use ice melt to get rid of ice on walkways. Use a cordless phone. Questions to ask your health care provider Can you help me check my risk for a fall? Do any of my medicines make me more likely to fall? Should I take a vitamin D supplement? What exercises can I do to improve my strength and balance? Should I make an appointment to have my vision checked? Do I need a bone density test to check for weak bones or osteoporosis? Would it help to use a cane or a walker? Where to find more information Centers for Disease Control and Prevention, [employer]: http://www.wolf.info/ Community-Based Fall Prevention Programs: http://www.wolf.info/ National Institute on Aging: http://kim-miller.com/ Contact a health care provider if: You fall at home. You are afraid of falling at home. You feel weak, drowsy, or dizzy. Summary Serious injuries from a fall happen most often to people older than age  57. Children and young adults ages 20-29 are also at higher risk. Talk with your health care provider about your risks for falling and how to lower those risks. Taking certain precautions at home can lower your risk for falling. If you fall, always tell your health care provider. This information is not intended to replace advice given to you by your health care provider. Make sure you discuss any questions you have with your health care provider. Document Revised: 12/31/2019 Document Reviewed: 12/31/2019 Elsevier Patient Education  [place].

## 2022-05-28 ENCOUNTER — Encounter: Payer: Self-pay | Admitting: Cardiovascular Disease

## 2022-05-28 NOTE — Progress Notes (Unsigned)
Cardiology Office Note   Date:  05/29/2022   ID:  Javier Rios, DOB February 10, 1951, MRN [id number]  PCP:  Javier Rigg, NP  Cardiologist:   Javier Miss, MD   Chief Complaint  Patient presents with   Coronary Artery Disease   Hyperlipidemia   1. History of coronary artery disease-status post PTCA and stenting of his right coronary artery-08/23/2006 he has moderate disease involving his LAD. 2. Hypertension 3. Hyperlipidemia   71 year old gentleman with a history of coronary artery disease. He is status post PTCA and stenting of his right coronary artery. He has a moderate stenosis in his LAD.  His last stress Myoview study was November, 2011. There is no evidence of anterior wall ischemia. He has a mild defect in his inferior wall which corresponds to his previous inferior basilar myocardial infarction.  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  He is not having any chest pain.  He has trouble with getting his meds.  He is eating lots of bologna because it is inexpensive.  His power to his apartment is off and he is unable to eat regular food.  He has been going to [employer] and eating chicken biscuits.    Feb. 6, 2014:  He has stopped taking all medications. He states he is not able to afford them. 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  He ran out of his meds.  He is still eating lots of salty foods ( bologna, fast foods)  January 20, 2013:  Javier Rios is staying active.  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  No CP.   Has not been taking his HCTZ or zocor due to cost.  He is still eating fast foods - although he is trying to cut back.   September 05, 2013:  Javier Rios is still doing well.  Still working 2 jobs - he has been having some indigestion - started taking gaviscon which helps.    He has had some dyspnea .  He has not wanted to have a stress test due to cost.  His insurance does not cover much.     He walks at work and typically is able to get to his work day without too much trouble.  Nov. 10, 2015:  Javier Rios is seen today for followup of his coronary artery disease and hypertension. His blood pressure is a little elevated today. He stayed up last night and watched the football game.  He also ate some fried chicken which probably had some extra salt in it. His BP has been elevated for the past several office visits.    April 29 , 2016:  Javier Rios is a 71 y.o. male who presents for follow-up of his coronary artery disease. BP is  Elevated this am .  He has been working all night.    He brought his BP log.  His readings are all ok. His blood pressure is typically in the 120/75 range. His heart rate is typically in the 70-80 range.   Nov. 9, 2016:   BP is up today . Has a cold and has been taking some cold meds May have eaten a bit of extra salt recently .   May 17 ,2017:  Doing well. Went to the ER with the   Nov. 28, 2017:  [name] is seen today for follow-up of his coronary artery disease, hypertension and congestive heart  failure. BP is elevated  Still eating lots of salt  Still smoking  Has requested an inhaler   Feb. 27, 2018:  Doing ok Is taking his meds.   BP is well controlled   03/09/2017: Has not been working . Made him pay his money back. He started taking SS at age 43 but he made too much   Has stopped some / most of his meds.  Unsure exactly what he is taking .   Still smoking.   I advised him that he could afford his meds if he quit smoking .  He is still taking atorvastatin ,  HCTZ,  Is not taking Kdur or aldactone  Is having some leg cramps since running out of the kdur .   Oct 12, 2017: Javier Rios seen today.  As usual, he is run out of some of his medications.  His blood pressure remains elevated.  He still smoking. Just got his medicaid card .   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 1,  2019: Javier Rios is doing well. BP is a bit high Has been eating more salt recently  Still smoking . Has cut back some   January 23, 2020:  Javier Rios is seen today for follow-up visit.  He has a history of coronary artery disease, hyperlipidemia, hypertension. No CP  Was having lots of muscle aches with atorvastatin  Also has had an intolerance to rosuvastatin. Still smoking  -   April 13, 2021: Javier Rios seen today for follow-up of his coronary artery disease, hyperlipidemia, hypertension.  He is intolerant to rosuvastatin and also atorvastatin.  We referred him to the lipid clinic at his last visit.  He is now on pravastatin - was started by Javier Denver, NP  Is due to have repeat blood work next month .   If he is not close to his goal of LDL of 70, will need to consider referral to the lipid clinic for consideration of a PCSK9 inhibitor.   Dec. 18, 2023 Javier Rios is seen today for follow up of his CAD, HLD Intolerant to statins   Exercising  Is back working , security,  walks quite a bit     Past Medical History:  Diagnosis Date   Chest pain    Coronary artery disease    Status post PTCA and stenting of his right coronary artery in March, 2008. 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 THERE IS INFEROBASILAR AKINESIS   CARDIOVASCULAR STRESS TEST  04/21/2010   EF 51%. NO SIGNIFICANT ST SEGMENT CHANGE SUGGESTIVE OF ISCHEMIA   CORONARY ANGIOPLASTY WITH STENT PLACEMENT     STENTING OF HIS RCA     Current Outpatient Medications  Medication Sig Dispense Refill   acetaminophen-codeine (TYLENOL #3) 300-30 MG tablet Take 1 tablet by mouth every 4 (four) hours as needed for moderate pain. 30 tablet 0   albuterol (PROAIR HFA) 108 (90 Base) MCG/ACT inhaler INHALE 2 PUFFS INTO THE LUNGS EVERY 6 (SIX) HOURS AS  NEEDED FOR WHEEZING OR SHORTNESS OF BREATH. 1 each 3   amLODipine (NORVASC) 10 MG tablet Take 1 tablet (10 mg total) by mouth daily. Please make PCP appointment.  90 tablet 1   aspirin 81 MG tablet Take 81 mg by mouth daily.     carvedilol (COREG) 6.25 MG tablet Take 1 tablet (6.25 mg total) by mouth 2 (two) times daily. 60 tablet 0   diclofenac (VOLTAREN) 75 MG EC tablet Take 1 tablet (75 mg total) by mouth 2 (two) times daily between meals as needed. Do not start taking until finished with steroid taper 60 tablet 2   ezetimibe (ZETIA) 10 MG tablet Take 1 tablet (10 mg total) by mouth daily. 90 tablet 3   methocarbamol (ROBAXIN) 500 MG tablet Take 1 tablet (500 mg total) by mouth 2 (two) times daily as needed for muscle spasms. 20 tablet 0   nitroGLYCERIN (NITROSTAT) 0.4 MG SL tablet PLACE 1 TABLET UNDER THE TOUNGE EVERY AS NEEDED FOR CHEST PAIN 25 tablet 3   pravastatin (PRAVACHOL) 40 MG tablet TAKE 1 TABLET BY MOUTH EVERY OTHER DAY 45 tablet 3   predniSONE (STERAPRED UNI-PAK 21 TAB) 10 MG (21) TBPK tablet Take as directed. Do not take with nsaids 21 tablet 0   sildenafil (VIAGRA) 100 MG tablet Take 100 mg by mouth daily as needed for erectile dysfunction.     chlorthalidone (HYGROTON) 25 MG tablet Take 1 tablet (25 mg total) by mouth daily. 90 tablet 1   spironolactone (ALDACTONE) 50 MG tablet Take 1 tablet (50 mg total) by mouth daily. 90 tablet 1   No current facility-administered medications for this visit.    Allergies:   Rosuvastatin and Atorvastatin    Social History:  The patient  reports that he has been smoking cigarettes. He has a 52.00 pack-year smoking history. He has never used smokeless tobacco. He reports current alcohol use. He reports current drug use. Drug: Marijuana.   Family History:  The patient's family history includes Hypertension in his brother, daughter, and mother.    ROS:  Noted in current hx     Physical Exam: Blood pressure 138/78, pulse 67, height  5' 9.5" (1.765 m), weight 148 lb 3.2 oz (67.2 kg), SpO2 99 %.       GEN:  Well nourished, well developed in no acute distress HEENT: Normal NECK: No JVD; No carotid bruits LYMPHATICS: No lymphadenopathy CARDIAC: RRR , no murmurs, rubs, gallops RESPIRATORY:  Clear to auscultation without rales, wheezing or rhonchi  ABDOMEN: Soft, non-tender, non-distended MUSCULOSKELETAL:  No edema; No deformity  SKIN: Warm and dry NEUROLOGIC:  Alert and oriented x 3    EKG:    May 29, 2022: Normal sinus rhythm at 67.  Left ventricular hypertrophy.  Previous inferior wall myocardial infarction.  No acute changes.   Recent Labs: 05/29/2022: ALT 22; BUN 12; Creatinine, Ser 1.10; Potassium 3.9; Sodium 137    Lipid Panel    Component Value Date/Time   CHOL 210 (H) 05/29/2022 0843   TRIG 151 (H) 05/29/2022 0843   HDL 52 05/29/2022 0843   CHOLHDL 4.0 05/29/2022 0843   CHOLHDL 3.2 10/27/2015 1132   VLDL 32 (H) 10/27/2015 1132   LDLCALC 131 (H) 05/29/2022 0843   LDLDIRECT 137.2 01/20/2013 1016      Wt Readings from Last 3 Encounters:  05/29/22 148 lb 3.2 oz (67.2 kg)  06/15/21 150 lb 4 oz (68.2 kg)  04/13/21 150 lb 6.4 oz (68.2 kg)     Other studies Reviewed: Additional studies/ records that were reviewed today include: . Review of the above records demonstrates:    ASSESSMENT AND PLAN:  1. History of coronary artery  disease-      No angina     2.  Hypertensive heart disease without congestive heart failure     Blood pressure is well-controlled.   3. Hyperlipidemia -     .  Will check labs today.  Continue current medications.     Current medicines are reviewed at length with the patient today.  The patient does not have concerns regarding medicines.  The following changes have been made:  no change  Labs/ tests ordered today include:   Orders Placed This Encounter  Procedures   ALT   Basic metabolic panel   Lipid panel   EKG 12-Lead       Disposition:    Office visit in 1 year     Javier MissPhilip Azavier Creson, MD  05/29/2022 6:45 PM    [facility] [address], [place], [place]  [address] Phone: [phone]; Fax: [phone]

## 2022-05-29 ENCOUNTER — Ambulatory Visit: Payer: Medicare HMO | Attending: Cardiovascular Disease | Admitting: Cardiovascular Disease

## 2022-05-29 ENCOUNTER — Encounter: Payer: Self-pay | Admitting: Cardiovascular Disease

## 2022-05-29 VITALS — BP 138/78 | HR 67 | Ht 69.5 in | Wt 148.2 lb

## 2022-05-29 DIAGNOSIS — I10 Essential (primary) hypertension: Secondary | ICD-10-CM | POA: Diagnosis not present

## 2022-05-29 DIAGNOSIS — I2510 Atherosclerotic heart disease of native coronary artery without angina pectoris: Secondary | ICD-10-CM | POA: Diagnosis not present

## 2022-05-29 DIAGNOSIS — E782 Mixed hyperlipidemia: Secondary | ICD-10-CM

## 2022-05-29 LAB — LIPID PANEL
Chol/HDL Ratio: 4 ratio (ref 0.0–5.0)
Cholesterol, Total: 210 mg/dL — ABNORMAL HIGH (ref 100–199)
HDL: 52 mg/dL (ref >39)
LDL Chol Calc (NIH): 131 mg/dL — ABNORMAL HIGH (ref 0–99)
Triglycerides: 151 mg/dL — ABNORMAL HIGH (ref 0–149)
VLDL Cholesterol Cal: 27 mg/dL (ref 5–40)

## 2022-05-29 LAB — BASIC METABOLIC PANEL
BUN/Creatinine Ratio: 11 (ref 10–24)
BUN: 12 mg/dL (ref 8–27)
CO2: 24 mmol/L (ref 20–29)
Calcium: 9.4 mg/dL (ref 8.6–10.2)
Chloride: 101 mmol/L (ref 96–106)
Creatinine, Ser: 1.1 mg/dL (ref 0.76–1.27)
Glucose: 101 mg/dL — ABNORMAL HIGH (ref 70–99)
Potassium: 3.9 mmol/L (ref 3.5–5.2)
Sodium: 137 mmol/L (ref 134–144)
eGFR: 73 mL/min/{1.73_m2} (ref >59)

## 2022-05-29 LAB — ALT: ALT: 22 IU/L (ref 0–44)

## 2022-05-29 NOTE — Patient Instructions (Signed)
Medication Instructions:  Your physician recommends that you continue on your current medications as directed. Please refer to the Current Medication list given to you today.  *If you need a refill on your cardiac medications before your next appointment, please call your pharmacy*   Lab Work: Lipids, ALT, BMET today If you have labs (blood work) drawn today and your tests are completely normal, you will receive your results only by: MyChart Message (if you have MyChart) OR A paper copy in the mail If you have any lab test that is abnormal or we need to change your treatment, we will call you to review the results.   Testing/Procedures: NONE   Follow-Up: At [facility], you and your health needs are our priority.  As part of our continuing mission to provide you with exceptional heart care, we have created designated Provider Care Teams.  These Care Teams include your primary Cardiologist (physician) and Advanced Practice Providers (APPs -  Physician Assistants and Nurse Practitioners) who all work together to provide you with the care you need, when you need it.  We recommend signing up for the patient portal called "MyChart".  Sign up information is provided on this After Visit Summary.  MyChart is used to connect with patients for Virtual Visits (Telemedicine).  Patients are able to view lab/test results, encounter notes, upcoming appointments, etc.  Non-urgent messages can be sent to your provider as well.   To learn more about what you can do with MyChart, go to [email or web address].    Your next appointment:   1 year(s)  The format for your next appointment:   In Person  Provider:   Philip Nahser, MD       Important Information About Sugar       

## 2022-06-01 ENCOUNTER — Telehealth: Payer: Self-pay | Admitting: Cardiovascular Disease

## 2022-06-01 DIAGNOSIS — E782 Mixed hyperlipidemia: Secondary | ICD-10-CM

## 2022-06-01 NOTE — Telephone Encounter (Signed)
Left detailed message of results per DPR and recommendation to see Pharm[facility] lipid clinic. Order placed at this time.

## 2022-06-01 NOTE — Telephone Encounter (Signed)
-----   Message from Vesta Mixer, MD sent at 05/30/2022 11:26 AM EST ----- BMP is stable LDL remains elevated :   131 His goal is < 55 On pravachol and zetia, does not tolerate rousvastatin or atorvastatin  Please refer to lipid clinic for consideration for PCSK9 inhibitor

## 2022-07-06 ENCOUNTER — Ambulatory Visit (HOSPITAL_COMMUNITY)
Admission: RE | Admit: 2022-07-06 | Discharge: 2022-07-06 | Disposition: A | Discharge status: Home / Self Care | Payer: Medicare HMO | Source: Ambulatory Visit | Attending: Acute Care | Admitting: Acute Care

## 2022-07-06 DIAGNOSIS — Z122 Encounter for screening for malignant neoplasm of respiratory organs: Secondary | ICD-10-CM | POA: Diagnosis not present

## 2022-07-06 DIAGNOSIS — Z87891 Personal history of nicotine dependence: Secondary | ICD-10-CM

## 2022-07-06 DIAGNOSIS — I700 Atherosclerosis of aorta: Secondary | ICD-10-CM | POA: Insufficient documentation

## 2022-07-06 DIAGNOSIS — I2510 Atherosclerotic heart disease of native coronary artery without angina pectoris: Secondary | ICD-10-CM | POA: Insufficient documentation

## 2022-07-06 DIAGNOSIS — J432 Centrilobular emphysema: Secondary | ICD-10-CM | POA: Insufficient documentation

## 2022-07-06 DIAGNOSIS — F17210 Nicotine dependence, cigarettes, uncomplicated: Secondary | ICD-10-CM

## 2022-07-07 ENCOUNTER — Other Ambulatory Visit: Payer: Self-pay | Admitting: Acute Care

## 2022-07-07 DIAGNOSIS — Z122 Encounter for screening for malignant neoplasm of respiratory organs: Secondary | ICD-10-CM

## 2022-07-07 DIAGNOSIS — F17210 Nicotine dependence, cigarettes, uncomplicated: Secondary | ICD-10-CM

## 2022-07-07 DIAGNOSIS — Z87891 Personal history of nicotine dependence: Secondary | ICD-10-CM

## 2022-07-12 ENCOUNTER — Telehealth: Payer: Self-pay | Admitting: Acute Care

## 2022-07-12 NOTE — Telephone Encounter (Signed)
I have attempted to call the patient to review  the results of their  Low Dose CT Chest Lung cancer screening scan. Pt. Xcalled ans requested we call to review the letter with him 38 minutes ago. There was no answer. I have left a HIPPA compliant VM requesting the patient call the office to review the  scan results with one of the nurses. I included the office contact information in the message.  Langley Gauss, can you try the patient again today and see if he answers? Scan was a LR 2, but there was notation of emphysema and CAD. Thanks

## 2022-07-12 NOTE — Telephone Encounter (Signed)
Hey can you give this patient a call to explain the cancer screening letter.  Thank you   I could not for the life of me remember the other Denise's last name but I could remember yours.

## 2022-07-12 NOTE — Telephone Encounter (Signed)
Pt. Has returned the call. I have  gone over the letter with him in detail.. I explained in detail what emphysema is and also about his CAD. He is followed by cardiology and he is in statin therapy. We have dicussed the importance of  quitting smoking to prevent further emphysema scarring. I told him that the scarring he already has will not get better, but that if he quits smoking he can prevent further progression.  He verbalized understanding and had no further questions at completion of the call.

## 2022-07-13 ENCOUNTER — Ambulatory Visit: Payer: Medicare HMO

## 2022-07-13 NOTE — Telephone Encounter (Signed)
See other  telephone note from 07/12/22

## 2022-08-10 ENCOUNTER — Ambulatory Visit: Payer: Medicare HMO | Attending: Internal Medicine | Admitting: Pharmacist

## 2022-08-10 DIAGNOSIS — E782 Mixed hyperlipidemia: Secondary | ICD-10-CM | POA: Diagnosis not present

## 2022-08-10 DIAGNOSIS — Z716 Tobacco abuse counseling: Secondary | ICD-10-CM

## 2022-08-10 MED ORDER — NICOTINE 7 MG/24HR TD PT24: 7.0000 mg | MEDICATED_PATCH | Freq: Every day | TRANSDERMAL | 0 refills | Status: DC

## 2022-08-10 MED ORDER — NICOTINE 14 MG/24HR TD PT24: 14.0000 mg | MEDICATED_PATCH | Freq: Every day | TRANSDERMAL | 0 refills | Status: DC

## 2022-08-10 MED ORDER — ROSUVASTATIN CALCIUM 5 MG PO TABS: ORAL_TABLET | ORAL | 3 refills | Status: DC

## 2022-08-10 MED ORDER — NICOTINE POLACRILEX 2 MG MT GUM
2.0000 mg | CHEWING_GUM | OROMUCOSAL | 1 refills | Status: DC | PRN
Start: 2022-08-10 — End: 2023-06-01

## 2022-08-10 NOTE — Progress Notes (Signed)
Patient ID: Javier Rios                 DOB: [birth year]                    MRN: [id number]      HPI: Javier Rios is a 72 y.o. male patient referred to lipid clinic by Dr. Acie Fredrickson. PMH is significant for CAD status post PTCA and stenting of his right coronary artery, HTN, HLD.  Last LDL-C was 131.  Patient has a history of statin intolerance but was referred to the lipid clinic.  Patient presents today to lipid clinic.  He states that he tried to take the ezetimibe but it caused cramping and he stopped it.  Also he was having cramping with pravastatin and decrease to 3 times a week and is tolerating this okay.    He desires to quit smoking.  He recently about a month ago cut back from 1 pack a day to about 1 pack every 2 to 3 days.  He does fine when he is busy and is not thinking about it, it is more when he finishes a meal that he wants a cigarette.  He quit for about 3 years and started smoking again when he got divorced.  Reviewed options for lowering LDL cholesterol, including low-dose alternative statin dosing and PCSK-9 inhibitors.  Discussed mechanisms of action, dosing, side effects and potential decreases in LDL cholesterol.  Also reviewed cost information and potential options for patient assistance.   Current Medications: pravastatin '40mg'$  daily 3 times a week Intolerances: atorvastatin 40, rosuvastatin '10mg'$ , simvastatin 40, ezetimibe '10mg'$  daily (leg cramps) pravastatin 40 mg daily Risk Factors: CAD, HTN LDL-C goal: <70 ApoB goal: <80  Diet: fish 2-3 times per week, salads-cut back on red meat and processed meats  Exercise: walks daily as security guard, yard work  Family History:  Family History  Problem Relation Age of Onset   Hypertension Mother    Hypertension Brother    Hypertension Daughter     Social History: + tobacco down to 1 pack every 2-3 days, 1 beer with football games  Labs: Lipid Panel     Component Value Date/Time   CHOL 210 (H) 05/29/2022 0843   TRIG  151 (H) 05/29/2022 0843   HDL 52 05/29/2022 0843   CHOLHDL 4.0 05/29/2022 0843   CHOLHDL 3.2 10/27/2015 1132   VLDL 32 (H) 10/27/2015 1132   LDLCALC 131 (H) 05/29/2022 0843   LDLDIRECT 137.2 01/20/2013 1016   LABVLDL 27 05/29/2022 0843    Past Medical History:  Diagnosis Date   Chest pain    Coronary artery disease    Status post PTCA and stenting of his right coronary artery in March, 2008. He also has a 40-50% LAD stenosis   Hypertension    LAD stenosis    MODERATE BETWEEN 50-60%   MI, old 2008   INFERIOR WALL    Current Outpatient Medications on File Prior to Visit  Medication Sig Dispense Refill   acetaminophen-codeine (TYLENOL #3) 300-30 MG tablet Take 1 tablet by mouth every 4 (four) hours as needed for moderate pain. 30 tablet 0   albuterol (PROAIR HFA) 108 (90 Base) MCG/ACT inhaler INHALE 2 PUFFS INTO THE LUNGS EVERY 6 (SIX) HOURS AS NEEDED FOR WHEEZING OR SHORTNESS OF BREATH. 1 each 3   amLODipine (NORVASC) 10 MG tablet Take 1 tablet (10 mg total) by mouth daily. Please make PCP appointment. 90 tablet 1   aspirin 81  MG tablet Take 81 mg by mouth daily.     carvedilol (COREG) 6.25 MG tablet Take 1 tablet (6.25 mg total) by mouth 2 (two) times daily. 60 tablet 0   chlorthalidone (HYGROTON) 25 MG tablet Take 1 tablet (25 mg total) by mouth daily. 90 tablet 1   diclofenac (VOLTAREN) 75 MG EC tablet Take 1 tablet (75 mg total) by mouth 2 (two) times daily between meals as needed. Do not start taking until finished with steroid taper 60 tablet 2   methocarbamol (ROBAXIN) 500 MG tablet Take 1 tablet (500 mg total) by mouth 2 (two) times daily as needed for muscle spasms. 20 tablet 0   nitroGLYCERIN (NITROSTAT) 0.4 MG SL tablet PLACE 1 TABLET UNDER THE TOUNGE EVERY 5MINS AS NEEDED FOR CHEST PAIN 25 tablet 3   predniSONE (STERAPRED UNI-PAK 21 TAB) 10 MG (21) TBPK tablet Take as directed. Do not take with nsaids 21 tablet 0   sildenafil (VIAGRA) 100 MG tablet Take 100 mg by mouth  daily as needed for erectile dysfunction.     spironolactone (ALDACTONE) 50 MG tablet Take 1 tablet (50 mg total) by mouth daily. 90 tablet 1   No current facility-administered medications on file prior to visit.    Allergies  Allergen Reactions   Rosuvastatin Other (See Comments)    Muscle aches    Atorvastatin     Muscle aches    Assessment/Plan:  1. 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 Reports that since changing his diet he feels a lot better  Plan: Discussed PCSK9 inhibitor with patient but at this time he is not interested in injections Patient is interested in trying rosuvastatin 5 mg 3 times a week.  This will be more effective than pravastatin 3 times a week due to longer half-life Advised that if he did not tolerate 3 times a week he could try cutting back to twice a week Recheck cholesterol in 2 months  Tobacco abuse counseling Patient interested in smoking.  Reviewed medication options with him including Wellbutrin, Chantix ,nicotine patches and gum. Patient interested in using nicotine replacement therapy He craves a cigarette after eating mainly Smokes a cigarette 1 or 2 hours after he gets up Patient would like to first try just the nicotine gum.  Reviewed that he will chew the gum until it tingles then park it for a little while then she would again and park it again.  He can chew up to 24 pieces per 24 hours.  Recommended at least 9 pieces/day in the first several weeks.  Handout given to patient. I have written him a prescription for the patches as well just in  case he decides to use those as well and have provided him with the 1 800 quit NOW number    Thank you,  Ramond Dial, Pharm.D, BCPS, CPP [facility] [address] [place], [place] [address]  Phone: [phone]; Fax: [phone]

## 2022-08-10 NOTE — Assessment & Plan Note (Signed)
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 Reports that since changing his diet he feels a lot better  Plan: Discussed PCSK9 inhibitor with patient but at this time he is not interested in injections Patient is interested in trying rosuvastatin 5 mg 3 times a week.  This will be more effective than pravastatin 3 times a week due to longer half-life Advised that if he did not tolerate 3 times a week he could try cutting back to twice a week Recheck cholesterol in 2 months

## 2022-08-10 NOTE — Assessment & Plan Note (Signed)
Patient interested in smoking.  Reviewed medication options with him including Wellbutrin, Chantix ,nicotine patches and gum. Patient interested in using nicotine replacement therapy He craves a cigarette after eating mainly Smokes a cigarette 1 or 2 hours after he gets up Patient would like to first try just the nicotine gum.  Reviewed that he will chew the gum until it tingles then park it for a little while then she would again and park it again.  He can chew up to 24 pieces per 24 hours.  Recommended at least 9 pieces/day in the first several weeks.  Handout given to patient. I have written him a prescription for the patches as well just in case he decides to use those as well and have provided him with the 1 800 quit NOW number

## 2022-08-10 NOTE — Patient Instructions (Signed)
STOP pravastatin START rosuvastatin '5mg'$  three times a week  Congratulations on deciding to quite smoking! This is a big step in improving your health!  Patients smoking ?10 cigarettes/day: Begin with step 2 (14 mg/day) for 6 weeks, followed by step 3 (7 mg/day) for 2 weeks  Apply new patch to nonhairy, clean, dry skin on the upper body or upper outer arm; each patch should be applied to a different site. Apply immediately after removing backing from patch; press onto skin for ~10 seconds. Patch may be worn for 16 or 24 hours. If cigarette cravings occur upon awakening, wear for 24 hours; if vivid dreams or other sleep disturbances occur, remove the patch at bedtime and apply a new patch in the morning. Do not cut patch; causes rapid evaporation, rendering the patch useless. Do not wear more than 1 patch at a time; do not leave patch on for more than 24 hours (may irritate skin). Wash hands after applying or removing patch. Discard patches by folding adhesive ends together, replace in pouch and dispose of properly in trash.  Gum: Chew 1 piece of gum when urge to smoke occurs. If strong or frequent cravings are present after 1 piece of gum, may use a second piece within the hour (do not continuously use one piece after the other).Use according to the following 12-week dosing schedule:  Weeks 1 to 6: Chew 1 piece of gum every 1 to 2 hours (maximum: 24 pieces/day); to increase chances of quitting, chew at least 9 pieces/day during the first 6 weeks  Weeks 7 to 9: Chew 1 piece of gum every 2 to 4 hours (maximum: 24 pieces/day)  Weeks 10 to 12: Chew 1 piece of gum every 4 to 8 hours (maximum: 24 pieces/day)  Chew slowly until it tingles, then place gum between cheek and gum until tingle is gone; repeat process until most of tingle is gone (~30 minutes). Do not eat or drink 15 minutes before using or while the gum is in mouth.  1-800-QUIT-NOW (431)088-1805) Calling this toll-free number will connect  you directly to your state quitline. All states have quitlines in place with trained coaches who provide information and help with quitting.

## 2022-09-07 ENCOUNTER — Telehealth: Payer: Self-pay | Admitting: Pharmacist

## 2022-09-07 MED ORDER — ROSUVASTATIN CALCIUM 5 MG PO TABS: ORAL_TABLET | ORAL | 3 refills | Status: DC

## 2022-09-07 NOTE — Telephone Encounter (Signed)
Spoke with patient about quitting smoking. He says that his pharmacy didn't fill his nicotine gum. Thinks he is smoking less. Advised he call 1-800 Quitnow for assistance and possible free nicotine gum. He said he didn't get his new cholesterol medication. Resent to [facility] per his request.

## 2022-10-05 ENCOUNTER — Ambulatory Visit: Payer: Medicare HMO

## 2022-10-23 ENCOUNTER — Ambulatory Visit: Payer: Medicare HMO | Attending: Cardiovascular Disease

## 2022-10-23 DIAGNOSIS — E782 Mixed hyperlipidemia: Secondary | ICD-10-CM

## 2022-10-24 LAB — LIPID PANEL
Chol/HDL Ratio: 4.2 ratio (ref 0.0–5.0)
Cholesterol, Total: 183 mg/dL (ref 100–199)
HDL: 44 mg/dL (ref >39)
LDL Chol Calc (NIH): 114 mg/dL — ABNORMAL HIGH (ref 0–99)
Triglycerides: 142 mg/dL (ref 0–149)
VLDL Cholesterol Cal: 25 mg/dL (ref 5–40)

## 2022-10-24 LAB — HEPATIC FUNCTION PANEL
ALT: 20 IU/L (ref 0–44)
AST: 19 IU/L (ref 0–40)
Albumin: 4.5 g/dL (ref 3.9–4.9)
Alkaline Phosphatase: 77 IU/L (ref 44–121)
Bilirubin Total: 0.4 mg/dL (ref 0.0–1.2)
Bilirubin, Direct: 0.14 mg/dL (ref 0.00–0.40)
Total Protein: 6.6 g/dL (ref 6.0–8.5)

## 2022-10-26 ENCOUNTER — Telehealth: Payer: Self-pay | Admitting: Pharmacist

## 2022-10-26 DIAGNOSIS — E782 Mixed hyperlipidemia: Secondary | ICD-10-CM

## 2022-10-26 MED ORDER — ROSUVASTATIN CALCIUM 5 MG PO TABS: 5.0000 mg | ORAL_TABLET | ORAL | 3 refills | Status: DC

## 2022-10-26 NOTE — Telephone Encounter (Signed)
Patient has been taking pravastatin 40mg  three times a week, not rosuvastatin.  His PCP had increased pravastatin 80 mg but patient too afraid to take. Revived labs with patient. He has improved his labs with diet and I congratulated him on this.  We discussed again how rosuvastatin would be much more effective when not taken every day versus pravastatin.  Patient is in agreement to take rosuvastatin 5 mg every other day.  He will come for lab work on July 18.  Patient working on cutting back smoking.  We talked about using the 1 800 quit now line to get patches or gum.  Also stated that if he would like to talk about other medication options to quit we will be happy to talk about that with him.

## 2022-12-28 ENCOUNTER — Ambulatory Visit: Payer: Medicare HMO

## 2023-01-05 ENCOUNTER — Ambulatory Visit: Payer: Medicare HMO | Attending: Nurse Practitioner

## 2023-01-08 ENCOUNTER — Telehealth: Payer: Self-pay | Admitting: Pharmacist

## 2023-01-08 NOTE — Telephone Encounter (Signed)
Patient missed lab apt for lipid labs. Called to reschedule. LVM

## 2023-01-10 NOTE — Telephone Encounter (Signed)
Pt left voicemail for Melissa returning her call. I called pt back, no answer, left another voicemail.

## 2023-01-12 ENCOUNTER — Ambulatory Visit: Payer: Medicare HMO | Attending: Cardiology

## 2023-01-12 DIAGNOSIS — E782 Mixed hyperlipidemia: Secondary | ICD-10-CM

## 2023-01-23 ENCOUNTER — Telehealth: Payer: Self-pay

## 2023-01-23 ENCOUNTER — Telehealth: Payer: Self-pay | Admitting: Pharmacist

## 2023-01-23 ENCOUNTER — Other Ambulatory Visit (HOSPITAL_COMMUNITY): Payer: Self-pay

## 2023-01-23 NOTE — Telephone Encounter (Signed)
PA request has been Submitted. New Encounter created for follow up. For additional info see Pharmacy Prior Auth telephone encounter from 01/23/23.

## 2023-01-23 NOTE — Telephone Encounter (Addendum)
Pharmacy Patient Advocate Encounter   Received notification from Physician's Office that prior authorization for REPATHA is required/requested.   Insurance verification completed.   The patient is insured through [employer] .   Per test claim: PA required; PA submitted to [facility] via CoverMyMeds Key/confirmation #/EOC [facility] Status is pending

## 2023-01-23 NOTE — Telephone Encounter (Signed)
Spoke to patient about lab results.  LDL-C is up to 156.  Patient reports that he could not take the rosuvastatin.  He even tried taking it twice a week and it cramped him too bad.  He is taking pravastatin 40 mg 3 times a week.  Cannot take daily as it also causes too many cramps.  We talked again about PCSK9 inhibitors.  Patient now agreeable.  Patient has history of CAD status post stenting he is intolerant to atorvastatin 40 rosuvastatin 10 rosuvastatin 5 daily rosuvastatin 5 twice a week simvastatin 40 pravastatin 40 mg daily and ezetimibe 10 mg daily all due to cramps  Tech team please complete prior authorization for Repatha.

## 2023-01-23 NOTE — Telephone Encounter (Signed)
Pharmacy Patient Advocate Encounter  Received notification from [facility] that Prior Authorization for REPATHA has been APPROVED from 06/12/22 to 06/12/23. Ran test claim, Copay is $11.20 FOR 3 MONTH SUPPLY. This test claim was processed through [facility]- copay amounts may vary at other pharmacies due to pharmacy/plan contracts, or as the patient moves through the different stages of their insurance plan.

## 2023-01-24 MED ORDER — REPATHA SURECLICK 140 MG/ML ~~LOC~~ SOAJ: 1.0000 mL | SUBCUTANEOUS | 11 refills | Status: DC

## 2023-01-24 NOTE — Telephone Encounter (Signed)
Called pt and made him aware of approval. He is nervous about injection. Advised for him to pick up rx (thinks he will get Friday) and then call us to set up a time to stop by to go over injection. Reminded him he will not see the needle and he should put in fridge when he picks up.

## 2023-01-24 NOTE — Addendum Note (Signed)
Addended by: Malena Peer D on: 01/24/2023 08:36 AM   Modules accepted: Orders

## 2023-02-05 ENCOUNTER — Ambulatory Visit
Admission: RE | Admit: 2023-02-05 | Discharge: 2023-02-05 | Disposition: A | Discharge status: Home / Self Care | Payer: Medicare HMO | Source: Ambulatory Visit | Attending: Physician Assistant | Admitting: Physician Assistant

## 2023-02-05 VITALS — BP 162/90 | HR 82 | Temp 97.9°F | Resp 18

## 2023-02-05 DIAGNOSIS — M542 Cervicalgia: Secondary | ICD-10-CM

## 2023-02-05 MED ORDER — PREDNISONE 20 MG PO TABS: 40.0000 mg | ORAL_TABLET | Freq: Every day | ORAL | 0 refills | Status: AC

## 2023-02-05 MED ORDER — TIZANIDINE HCL 4 MG PO TABS
2.0000 mg | ORAL_TABLET | Freq: Four times a day (QID) | ORAL | 0 refills | Status: AC | PRN
Start: 2023-02-05 — End: ?

## 2023-02-05 NOTE — ED Triage Notes (Signed)
Pt c/o rt sided neck pain radiating down back and rt shoulder for 3 wks. States taking motrin and diclofenac with no relief. Denies injury states woke up that way.

## 2023-02-06 NOTE — ED Provider Notes (Signed)
[facility]    CSN: [id number] Arrival date & time: 02/05/23  1449      History   Chief Complaint Chief Complaint  Patient presents with   Neck Injury    I have been having trouble with my neck  I thought it was a muscle spasms  but it been going on for 3 wks - Entered by patient    HPI Javier Rios is a 72 y.o. male.   Patient here today for evaluation of right-sided neck pain that radiates down his right trapezius area that is been present for 3 weeks.  He states that he woke with symptoms one day and they are not resolving with Motrin or diclofenac.  He has not had any numbness or tingling.  He denies any injury.  The history is provided by the patient.  Neck Injury    Past Medical History:  Diagnosis Date   Chest pain    Coronary artery disease    Status post PTCA and stenting of his right coronary artery in March, 2008. He also has a 40-50% LAD stenosis   Hypertension    LAD stenosis    MODERATE BETWEEN 50-60%   MI, old 2008   INFERIOR WALL    Patient Active Problem List   Diagnosis Date Noted   Tobacco abuse counseling 08/10/2022   Chronic left shoulder pain 04/12/2021   Elevated blood pressure reading in office with diagnosis of hypertension 04/12/2021   Systolic murmur 10/27/2015   Hyperlipidemia 10/09/2014   COPD (chronic obstructive pulmonary disease) (HCC) 09/05/2013   Hypertension 10/12/2011   CAD (coronary artery disease) 03/29/2011    Past Surgical History:  Procedure Laterality Date   CARDIAC CATHETERIZATION  08/23/2006   EF 65%. THERE IS INFEROBASILAR AKINESIS   CARDIOVASCULAR STRESS TEST  04/21/2010   EF 51%. NO SIGNIFICANT ST SEGMENT CHANGE SUGGESTIVE OF ISCHEMIA   CORONARY ANGIOPLASTY WITH STENT PLACEMENT     STENTING OF HIS RCA       Home Medications    Prior to Admission medications   Medication Sig Start Date End Date Taking? Authorizing Provider  predniSONE (DELTASONE) 20 MG tablet Take 2 tablets (40 mg total) by  mouth daily with breakfast for 5 days. 02/05/23 02/10/23 Yes Tomi Bamberger, PA-C  tiZANidine (ZANAFLEX) 4 MG tablet Take 0.5-1 tablets (2-4 mg total) by mouth every 6 (six) hours as needed for muscle spasms. 02/05/23  Yes Tomi Bamberger, PA-C  acetaminophen-codeine (TYLENOL #3) 300-30 MG tablet Take 1 tablet by mouth every 4 (four) hours as needed for moderate pain. 05/25/21   Claiborne Rigg, NP  albuterol (PROAIR HFA) 108 (90 Base) MCG/ACT inhaler INHALE 2 PUFFS INTO THE LUNGS EVERY 6 (SIX) HOURS AS NEEDED FOR WHEEZING OR SHORTNESS OF BREATH. 06/15/21   Claiborne Rigg, NP  amLODipine (NORVASC) 10 MG tablet Take 1 tablet (10 mg total) by mouth daily. Please make PCP appointment. 06/15/21   Claiborne Rigg, NP  aspirin 81 MG tablet Take 81 mg by mouth daily.    [provider]  carvedilol (COREG) 6.25 MG tablet Take 1 tablet (6.25 mg total) by mouth 2 (two) times daily. 11/02/21   Hoy Register, MD  chlorthalidone (HYGROTON) 25 MG tablet Take 1 tablet (25 mg total) by mouth daily. 06/15/21 09/13/21  Claiborne Rigg, NP  diclofenac (VOLTAREN) 75 MG EC tablet Take 1 tablet (75 mg total) by mouth 2 (two) times daily between meals as needed. Do not start taking until  finished with steroid taper 06/29/21   Cristie Hem, PA-C  Evolocumab (REPATHA SURECLICK) 140 MG/ML SOAJ Inject 140 mg into the skin every 14 (fourteen) days. 01/24/23   Nahser, Deloris Ping, MD  nicotine (NICOTINE STEP 2) 14 mg/24hr patch Place 1 patch (14 mg total) onto the skin daily. For 6 weeks then use 7mg  patch for 2 weeks 08/10/22   Malena Peer D, RPH-CPP  nicotine (NICOTINE STEP 3) 7 mg/24hr patch Place 1 patch (7 mg total) onto the skin daily. 08/10/22   Malena Peer D, RPH-CPP  nicotine polacrilex (NICORETTE) 2 MG gum Take 1 each (2 mg total) by mouth as needed for smoking cessation. 08/10/22   Maccia, Efraim Kaufmann D, RPH-CPP  nitroGLYCERIN (NITROSTAT) 0.4 MG SL tablet PLACE 1 TABLET UNDER THE TOUNGE EVERY AS NEEDED FOR  CHEST PAIN 12/12/19   Claiborne Rigg, NP  sildenafil (VIAGRA) 100 MG tablet Take 100 mg by mouth daily as needed for erectile dysfunction. 02/22/22   [provider]  spironolactone (ALDACTONE) 50 MG tablet Take 1 tablet (50 mg total) by mouth daily. 06/15/21 09/13/21  Claiborne Rigg, NP    Family History Family History  Problem Relation Age of Onset   Hypertension Mother    Hypertension Brother    Hypertension Daughter     Social History Social History   Tobacco Use   Smoking status: Every Day    Current packs/day: 1.00    Average packs/day: 1 pack/day for 52.0 years (52.0 ttl pk-yrs)    Types: Cigarettes   Smokeless tobacco: Never  Vaping Use   Vaping status: Never Used  Substance Use Topics   Alcohol use: Yes    Comment: ocassion   Drug use: Yes    Types: Marijuana     Allergies   Rosuvastatin and Atorvastatin   Review of Systems Review of Systems  Constitutional:  Negative for chills and fever.  Eyes:  Negative for discharge and redness.  Musculoskeletal:  Positive for myalgias. Negative for arthralgias.  Neurological:  Negative for numbness.     Physical Exam Triage Vital Signs ED Triage Vitals [02/05/23 1503]  Encounter Vitals Group     BP (!) 162/90     Systolic BP Percentile      Diastolic BP Percentile      Pulse Rate 82     Resp 18     Temp 97.9 F (36.6 C)     Temp Source Oral     SpO2 97 %     Weight      Height      Head Circumference      Peak Flow      Pain Score 9     Pain Loc      Pain Education      Exclude from Growth Chart    No data found.  Updated Vital Signs BP (!) 162/90 (BP Location: Left Arm)   Pulse 82   Temp 97.9 F (36.6 C) (Oral)   Resp 18   SpO2 97%      Physical Exam Vitals and nursing note reviewed.  Constitutional:      General: He is not in acute distress.    Appearance: Normal appearance. He is not ill-appearing.  HENT:     Head: Normocephalic and atraumatic.  Eyes:     Conjunctiva/sclera:  Conjunctivae normal.  Cardiovascular:     Rate and Rhythm: Normal rate.  Pulmonary:     Effort: Pulmonary effort is normal. No respiratory  distress.  Musculoskeletal:     Comments: Full ROM of neck, mild TTP to right trapezius diffusely  Neurological:     Mental Status: He is alert.  Psychiatric:        Mood and Affect: Mood normal.        Behavior: Behavior normal.        Thought Content: Thought content normal.      UC Treatments / Results  Labs (all labs ordered are listed, but only abnormal results are displayed) Labs Reviewed - No data to display  EKG   Radiology No results found.  Procedures Procedures (including critical care time)  Medications Ordered in UC Medications - No data to display  Initial Impression / Assessment and Plan / UC Course  I have reviewed the triage vital signs and the nursing notes.  Pertinent labs & imaging results that were available during my care of the patient were reviewed by me and considered in my medical decision making (see chart for details).    Will treat with steroid burst and muscle relaxer.  Advised muscle relaxer may cause drowsiness and recommended one half dose initially and to use with caution.  Encouraged follow-up if no gradual improvement with any further concerns.  Final Clinical Impressions(s) / UC Diagnoses   Final diagnoses:  Neck pain   Discharge Instructions   None    ED Prescriptions     Medication Sig Dispense Auth. Provider   predniSONE (DELTASONE) 20 MG tablet Take 2 tablets (40 mg total) by mouth daily with breakfast for 5 days. 10 tablet Erma Pinto F, PA-C   tiZANidine (ZANAFLEX) 4 MG tablet Take 0.5-1 tablets (2-4 mg total) by mouth every 6 (six) hours as needed for muscle spasms. 30 tablet Tomi Bamberger, PA-C      PDMP not reviewed this encounter.   Tomi Bamberger, PA-C 02/06/23 1315

## 2023-02-13 ENCOUNTER — Encounter (HOSPITAL_COMMUNITY): Payer: Self-pay

## 2023-02-13 ENCOUNTER — Other Ambulatory Visit: Payer: Self-pay

## 2023-02-13 ENCOUNTER — Emergency Department (HOSPITAL_COMMUNITY)
Admission: EM | Admit: 2023-02-13 | Discharge: 2023-02-13 | Discharge status: Against Medical Advice | Payer: Medicare HMO | Attending: Emergency Medicine | Admitting: Emergency Medicine

## 2023-02-13 DIAGNOSIS — M542 Cervicalgia: Secondary | ICD-10-CM | POA: Insufficient documentation

## 2023-02-13 DIAGNOSIS — Z5321 Procedure and treatment not carried out due to patient leaving prior to being seen by health care provider: Secondary | ICD-10-CM | POA: Diagnosis not present

## 2023-02-13 DIAGNOSIS — R202 Paresthesia of skin: Secondary | ICD-10-CM | POA: Diagnosis not present

## 2023-02-13 NOTE — ED Triage Notes (Signed)
Pt c/o neck painx5wks. Pt c/o int tingling in right shoulder. Pt denies injury. Pt walked well to triage

## 2023-02-13 NOTE — ED Notes (Signed)
Pt left AMA due to long ED wait times.

## 2023-02-16 ENCOUNTER — Other Ambulatory Visit: Payer: Self-pay | Admitting: Student

## 2023-02-16 ENCOUNTER — Ambulatory Visit
Admission: RE | Admit: 2023-02-16 | Discharge: 2023-02-16 | Disposition: A | Discharge status: Home / Self Care | Payer: Medicare HMO | Source: Ambulatory Visit | Attending: Student

## 2023-02-16 DIAGNOSIS — M5412 Radiculopathy, cervical region: Secondary | ICD-10-CM

## 2023-05-01 ENCOUNTER — Other Ambulatory Visit: Payer: Self-pay | Admitting: Acute Care

## 2023-05-01 DIAGNOSIS — Z87891 Personal history of nicotine dependence: Secondary | ICD-10-CM

## 2023-05-01 DIAGNOSIS — Z122 Encounter for screening for malignant neoplasm of respiratory organs: Secondary | ICD-10-CM

## 2023-05-01 DIAGNOSIS — F17210 Nicotine dependence, cigarettes, uncomplicated: Secondary | ICD-10-CM

## 2023-05-29 NOTE — Progress Notes (Unsigned)
Cardiology Office Note:  .   Date:  06/01/2023  ID:  Javier Rios, DOB [birth year], MRN [id number] PCP: Hillery Aldo, NP  [facility] Providers Cardiologist:  Kristeen Miss, MD { Click to update primary MD,subspecialty MD or APP then REFRESH:1}   Patient Profile: .      PMH Coronary artery disease PTCA and stenting of RCA 08/2006 40-50% LAD stenosis Hypertension Hyperlipidemia Statin intolerance Tobacco abuse  History of PTCA and stenting of his right coronary artery along with moderate stenosis in LAD 08/23/2006. Had Myoview November 2011 with no evidence of anterior wall ischemia. 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  He has maintained consistent follow-up with Dr. Elease Hashimoto and at times has struggled to afford his medication and afford healthy food. Was eating a lot of high sodium foods due to convenience.   He has had intolerances to multiple statins and was referred to lipid clinic. Seen by Malena Peer, [facility] 08/10/2022 to discuss PCSK9 inhibitor, however he did not want to administer injections.  He was tried on rosuvastatin 5 mg 2 days a week but had muscle cramps with this dose.  LDL was increased to 156 on 01/12/2023 and he was agreeable to try Repatha.  He was also counseled on using Nicorette gum to assist with stopping smoking.        History of Present Illness: .   Javier Rios is a very pleasant 72 y.o. male who is here today for follow-up of hypertension and CAD. He reports not taking his blood pressure this morning. He worked night shift and was rushing to appointment. Typically monitors BP 2-3 times a week at home, with readings usually no higher than 140/80. Occasional readings as high as 170 mmHg. Reports experiencing shortness of breath when engaging in physical activities such as yard work or playing with his grandchild. This  has been ongoing for a couple of years and has not worsened. Has reduced smoking from a pack a day to half a pack a day. He has not made a full effort to quit but is considering trying nicotine gum. Reports severe cramping when taking pravastatin daily; is able to tolerate taking it two to three times a week. He did not start Repatha due to concerns about the needle and the dosage.  He denies chest pain, palpitations, orthopnea, PND, presyncope, syncope. Weight is stable.   Discussed the use of AI scribe software for clinical note transcription with the patient, who gave verbal consent to proceed.   ROS: See HPI       Studies Reviewed: Marland Kitchen   EKG Interpretation Date/Time:  Friday June 01 2023 09:24:32 EST Ventricular Rate:  68 PR Interval:  170 QRS Duration:  100 QT Interval:  426 QTC Calculation: 452 R Axis:   57  Text Interpretation: Normal sinus rhythm Moderate voltage criteria for LVH, may be normal variant ( Sokolow-Lyon , Cornell product ) Cannot rule out Inferior infarct (cited on or before 27-May-2018) When compared with ECG of 01-Jun-2023 09:24, No significant change was found Confirmed by Eligha Bridegroom [phone]) on 06/01/2023 9:41:22 AM    Risk Assessment/Calculations:     HYPERTENSION CONTROL Vitals:   06/01/23 0921 06/01/23 1454  BP: (!) 158/80 (!) 160/90    The patient's blood pressure is elevated above target today. {Click here if intervention needs to be changed Refresh Note :1}  In order to address the patient's elevated BP: Blood pressure will be monitored at home to determine if medication changes need to be made.          Physical Exam:   VS:  BP (!) 160/90   Pulse 65   Resp 17   Ht 5\' 9"  (1.753 m)   Wt 149 lb (67.6 kg)   SpO2 98%   BMI 22.00 kg/m    Wt Readings from Last 3 Encounters:  06/01/23 149 lb (67.6 kg)  02/13/23 148 lb 2.4 oz (67.2 kg)  05/29/22 148 lb 3.2 oz (67.2 kg)    GEN: Well nourished, well developed in no acute distress NECK: No  JVD; No carotid bruits CARDIAC: RRR, no murmurs, rubs, gallops RESPIRATORY:  diminished breath sounds bilaterally without rales, wheezing or rhonchi  ABDOMEN: Soft, non-tender, non-distended EXTREMITIES:  No edema; No deformity     ASSESSMENT AND PLAN: .    CAD without angina: Remote history of MI with PTCA/stenting of RCA and moderate stenosis to LAD at time of cath in 2008.  He denies chest pain, dyspnea, or other symptoms concerning for angina.  No indication for further ischemic evaluation at this time. LDL is not well controlled. He is taking pravastatin 2-3 days per week. Focus on secondary prevention including heart healthy mostly plant based diet avoiding saturated fat, processed foods, simple carbohydrates, and sugar along with aiming for at least 150 minutes of moderate intensity exercise each week.   Hyperlipidemia LDL goal < 70: Lipid panel 01/12/2023 with total cholesterol 225, triglycerides 109, HDL 49, and LDL 156.  Emphasized the importance of LDL 70 or lower.  He was advised to start Repatha in addition to pravastatin, but he did not start it due to fear of needles.  He does not feel he would tolerate higher dose or higher intensity statin. He is willing to try Nexlizet 180-10 mg daily.  Advised him PA may be required and to notify us if cost prohibitive. We will get lipid panel and ALT in 2-3 months following initiation of therapy.   Hypertension: BP is elevated initially and remains elevated on my recheck.  He has not taken medication yet today. Has been taking medication inconsistently with taking carvedilol only once daily and occasional missed doses. Advised consistent daily use of antihypertensive therapy. We will continue medications spironolactone, amlodipine, carvedilol. Recommend use of a pill box and carrying evening dose of Carvedilol to work for consistent dosing. Advised appropriate BP monitoring and to call the office if BP consistently > 140/80.   Tobacco abuse: Admits he  has not put full effort into quitting. Has decreased # of cigarettes. He wants to try nicorette gum. Complete cessation advised.       Dispo: 6 months with Dr. Elease Hashimoto  Signed, Eligha Bridegroom, NP-C

## 2023-06-01 ENCOUNTER — Telehealth: Payer: Self-pay | Admitting: Pharmacy Technician

## 2023-06-01 ENCOUNTER — Encounter: Payer: Self-pay | Admitting: Nurse Practitioner

## 2023-06-01 ENCOUNTER — Ambulatory Visit: Payer: Medicare HMO | Attending: Nurse Practitioner | Admitting: Nurse Practitioner

## 2023-06-01 ENCOUNTER — Telehealth: Payer: Self-pay | Admitting: Nurse Practitioner

## 2023-06-01 ENCOUNTER — Other Ambulatory Visit (HOSPITAL_COMMUNITY): Payer: Self-pay

## 2023-06-01 VITALS — BP 160/90 | HR 65 | Resp 17 | Ht 69.0 in | Wt 149.0 lb

## 2023-06-01 DIAGNOSIS — I10 Essential (primary) hypertension: Secondary | ICD-10-CM

## 2023-06-01 DIAGNOSIS — Z716 Tobacco abuse counseling: Secondary | ICD-10-CM

## 2023-06-01 DIAGNOSIS — E785 Hyperlipidemia, unspecified: Secondary | ICD-10-CM

## 2023-06-01 DIAGNOSIS — I2510 Atherosclerotic heart disease of native coronary artery without angina pectoris: Secondary | ICD-10-CM | POA: Diagnosis not present

## 2023-06-01 MED ORDER — NEXLIZET 180-10 MG PO TABS: 1.0000 | ORAL_TABLET | Freq: Every day | ORAL | 3 refills | Status: DC

## 2023-06-01 NOTE — Telephone Encounter (Signed)
Pharmacy Patient Advocate Encounter   Received notification from Pt Calls Messages that prior authorization for nexlizet is required/requested.   Insurance verification completed.   The patient is insured through [employer] .   Per test claim: PA required; PA submitted to above mentioned insurance via CoverMyMeds Key/confirmation #/EOC [id number] Status is pending

## 2023-06-01 NOTE — Telephone Encounter (Signed)
Pharmacy Patient Advocate Encounter  Received notification from [facility] that Prior Authorization for nexlizet has been APPROVED from 06/12/22 to 06/11/24. Ran test claim, Copay is $11.20 one month. This test claim was processed through [facility]- copay amounts may vary at other pharmacies due to pharmacy/plan contracts, or as the patient moves through the different stages of their insurance plan.   PA #/Case ID/Reference #: [id number]

## 2023-06-01 NOTE — Telephone Encounter (Signed)
Please provide PA for Nexlizet.

## 2023-06-01 NOTE — Patient Instructions (Signed)
Medication Instructions:   Your physician recommends that you continue on your current medications as directed. Please refer to the Current Medication list given to you today.   *If you need a refill on your cardiac medications before your next appointment, please call your pharmacy*   Lab Work:  None ordered.  If you have labs (blood work) drawn today and your tests are completely normal, you will receive your results only by: MyChart Message (if you have MyChart) OR A paper copy in the mail If you have any lab test that is abnormal or we need to change your treatment, we will call you to review the results.   Testing/Procedures:  None ordered.   Follow-Up: At [facility], you and your health needs are our priority.  As part of our continuing mission to provide you with exceptional heart care, we have created designated Provider Care Teams.  These Care Teams include your primary Cardiologist (physician) and Advanced Practice Providers (APPs -  Physician Assistants and Nurse Practitioners) who all work together to provide you with the care you need, when you need it.  We recommend signing up for the patient portal called "MyChart".  Sign up information is provided on this After Visit Summary.  MyChart is used to connect with patients for Virtual Visits (Telemedicine).  Patients are able to view lab/test results, encounter notes, upcoming appointments, etc.  Non-urgent messages can be sent to your provider as well.   To learn more about what you can do with MyChart, go to [email or web address].    Your next appointment:   6 month(s)  Provider:   Kristeen Miss, MD     Other Instructions  HOW TO TAKE YOUR BLOOD PRESSURE  Rest 5 minutes before taking your blood pressure. Don't  smoke or drink caffeinated beverages for at least 30 minutes before. Take your blood pressure before (not after) you eat. Sit comfortably with your back supported and both feet on the floor (  don't cross your legs). Elevate your arm to heart level on a table or a desk. Use the proper sized cuff.  It should fit smoothly and snugly around your bare upper arm.  There should be  Enough room to slip a fingertip under the cuff.  The bottom edge of the cuff should be 1 inch above the crease Of the elbow. Please monitor your blood pressure once daily 2 hours after your am medication. If you blood pressure Consistently remains above 140 (systolic) top number or over 80 ( diastolic) bottom number X 3 days  Consecutively.  Please call our office at [phone] or send Mychart message.     ----Avoid cold medicines with D or DM at the end of them----

## 2023-06-01 NOTE — Progress Notes (Signed)
Has not taken daily meds consistently

## 2023-06-05 NOTE — Telephone Encounter (Signed)
Please inform patient that Nexlizet has been approved by his insurance. He will take this in addition to pravastatin. Would recommend that he return for lipid panel and ALT in 2-3 months after starting Nexlizet and let us know if there are any cost concerns or side effects.

## 2023-06-18 ENCOUNTER — Other Ambulatory Visit: Payer: Self-pay | Admitting: *Deleted

## 2023-06-18 DIAGNOSIS — E785 Hyperlipidemia, unspecified: Secondary | ICD-10-CM

## 2023-06-18 DIAGNOSIS — I2510 Atherosclerotic heart disease of native coronary artery without angina pectoris: Secondary | ICD-10-CM

## 2023-06-18 NOTE — Telephone Encounter (Signed)
 S/w pt is going to start nexlizet if affordable. Sent lab requisitions to pt to get fasting lipid/alt in 2-3 months. Will keep office posted if pt starts new med.

## 2023-06-20 ENCOUNTER — Telehealth: Payer: Self-pay

## 2023-06-20 NOTE — Telephone Encounter (Signed)
 Javier Rios,  You saw this patient on 06/01/2023. Per office protocol, will you please comment on medical clearance for colonoscopy on 07/13/2023?  Please route your response to P CV DIV Preop. I will communicate with requesting office once you have given recommendations.   Thank you!  [name], NP

## 2023-06-20 NOTE — Telephone Encounter (Signed)
   Pre-operative Risk Assessment    Patient Name: Javier Rios  DOB: 07-21-1950 MRN: [id number]   Date of last office visit: 06/01/23 Rosaline EMERSON Booty NP Date of next office visit: 12/03/23 Dr. Aleene DOROTHA Finely   Request for Surgical Clearance    Procedure:   Colonoscopy   Date of Surgery:  Clearance 07/13/23                                Surgeon:  Dr. Layla Lah Surgeon's Group or Practice Name:  [facility] Phone number:  [phone] Fax number:  [phone]   Type of Clearance Requested:   - Medical    Type of Anesthesia:   Propofol    Additional requests/questions:    Bonney Huxley Davione Lenker   06/20/2023, 4:23 PM

## 2023-06-21 NOTE — Telephone Encounter (Signed)
   Primary Cardiologist: Aleene Passe, MD  Patient was seen by me on 06/01/23. At that time, he was able to achieve > 4 METS activity without concerning cardiac symptoms. His RCRI for MACE is 6% due to history of ischemic heart disease. Based on ACC/AHA guidelines, Javier Rios would be at acceptable risk for the planned procedure without further cardiovascular testing.   Per office protocol, he may hold aspirin for 5-7 days prior to procedure and should resume as soon as hemodynamically stable postoperatively.  Patient was advised that if he develops new symptoms prior to surgery to contact our office to arrange a follow-up appointment. He verbalized understanding.  I will route this recommendation to the requesting party via Epic fax function and remove from pre-op pool.  Please call with questions.  Rosaline EMERSON Bane, NP-C  06/21/2023, 6:48 AM [address] Office [phone] Fax [phone]

## 2023-07-09 ENCOUNTER — Ambulatory Visit (HOSPITAL_COMMUNITY): Payer: Medicare HMO

## 2023-07-19 ENCOUNTER — Ambulatory Visit (HOSPITAL_COMMUNITY)
Admission: RE | Admit: 2023-07-19 | Discharge: 2023-07-19 | Disposition: A | Discharge status: Home / Self Care | Payer: Medicare HMO | Source: Ambulatory Visit | Attending: Acute Care | Admitting: Acute Care

## 2023-07-19 DIAGNOSIS — Z87891 Personal history of nicotine dependence: Secondary | ICD-10-CM | POA: Diagnosis present

## 2023-07-19 DIAGNOSIS — F17210 Nicotine dependence, cigarettes, uncomplicated: Secondary | ICD-10-CM | POA: Diagnosis present

## 2023-07-19 DIAGNOSIS — Z122 Encounter for screening for malignant neoplasm of respiratory organs: Secondary | ICD-10-CM | POA: Diagnosis present

## 2023-07-30 ENCOUNTER — Other Ambulatory Visit: Payer: Self-pay

## 2023-07-30 DIAGNOSIS — F17210 Nicotine dependence, cigarettes, uncomplicated: Secondary | ICD-10-CM

## 2023-07-30 DIAGNOSIS — Z122 Encounter for screening for malignant neoplasm of respiratory organs: Secondary | ICD-10-CM

## 2023-07-30 DIAGNOSIS — Z87891 Personal history of nicotine dependence: Secondary | ICD-10-CM

## 2023-11-29 NOTE — Progress Notes (Unsigned)
 Cardiology Office Note   Date:  11/29/2023   ID:  Javier Rios, DOB [birth year], MRN [id number]  PCP:  Javier Snare, NP  Cardiologist:   Javier Rios   Chief Complaint  Patient presents with   Coronary Artery Disease        Hyperlipidemia   1. History of coronary artery disease-status post PTCA and stenting of his right coronary artery-08/23/2006 he has moderate disease involving his LAD. 2. Hypertension 3. Hyperlipidemia   73 year old gentleman with a history of coronary artery disease. He is status post PTCA and stenting of his right coronary artery. He has a moderate stenosis in his LAD.  His last stress Myoview  study was November, 2011. There is no evidence of anterior wall ischemia. He has a mild defect in his inferior wall which corresponds to his previous inferior basilar myocardial infarction.  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  He is not having any chest pain.  He has trouble with getting his meds.  He is eating lots of bologna because it is inexpensive.  His power to his apartment is off and he is unable to eat regular food.  He has been going to [employer] and eating chicken biscuits.    Feb. 6, 2014:  He has stopped taking all medications. He states he is not able to afford them. 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  He ran out of his meds.  He is still eating lots of salty foods ( bologna, fast foods)  January 20, 2013:  Javier Rios is staying active.  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  No CP.   Has not been taking his HCTZ or zocor  due to cost.  He is still eating fast foods - although he is trying to cut back.   September 05, 2013:  Javier Rios is still doing well.  Still working 2 jobs - he has been having some indigestion - started taking gaviscon which helps.    He has had some dyspnea .  He has not wanted to have a stress test due to cost.  His insurance does not cover much.     He walks at work and typically is able to get to his work day without too much trouble.  Nov. 10, 2015:  Javier Rios is seen today for followup of his coronary artery disease and hypertension. His blood pressure is a little elevated today. He stayed up last night and watched the football game.  He also ate some fried chicken which probably had some extra salt in it. His BP has been elevated for the past several office visits.    April 29 , 2016:  Javier Rios is a 73 y.o. male who presents for follow-up of his coronary artery disease. BP is  Elevated this am .  He has been working all night.    He brought his BP log.  His readings are all ok. His blood pressure is typically in the 120/75 range. His heart rate is typically in the 70-80 range.   Nov. 9, 2016:   BP is up today . Has a cold and has been taking some cold meds May have eaten a bit of extra salt recently .   May 17 ,2017:  Doing well. Went to the ER with the   Nov. 28, 2017:  Javier Rios is seen today for follow-up of his coronary artery disease,  hypertension and congestive heart failure. BP is elevated  Still eating lots of salt  Still smoking  Has requested an inhaler   Feb. 27, 2018:  Doing ok Is taking his meds.   BP is well controlled   03/09/2017: Has not been working . Made him pay his money back. He started taking SS at age 7 but he made too much   Has stopped some / most of his meds.  Unsure exactly what he is taking .   Still smoking.   I advised him that he could afford his meds if he quit smoking .  He is still taking atorvastatin  ,  HCTZ,  Is not taking Kdur or aldactone   Is having some leg cramps since running out of the kdur .   Oct 12, 2017: Javier Rios seen today.  As usual, he is run out of some of his medications.  His blood pressure remains elevated.  He still smoking. Just got his medicaid card .   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 1,  2019: Javier Rios is doing well. BP is a bit high Has been eating more salt recently  Still smoking . Has cut back some   January 23, 2020:  Javier Rios is seen today for follow-up visit.  He has a history of coronary artery disease, hyperlipidemia, hypertension. No CP  Was having lots of muscle aches with atorvastatin   Also has had an intolerance to rosuvastatin . Still smoking  -   April 13, 2021: Javier Rios seen today for follow-up of his coronary artery disease, hyperlipidemia, hypertension.  He is intolerant to rosuvastatin  and also atorvastatin .  We referred him to the lipid clinic at his last visit.  He is now on pravastatin  - was started by Javier Hastings, NP  Is due to have repeat blood work next month .   If he is not close to his goal of LDL of 70, will need to consider referral to the lipid clinic for consideration of a PCSK9 inhibitor.   Dec. 18, 2023 Javier Rios is seen today for follow up of his CAD, HLD Intolerant to statins   Exercising  Is back working , security,  walks quite a bit   December 03, 2023 Javier Rios is seen for follow up of his CAD, HLD  Is intolerant to statins    Past Medical History:  Diagnosis Date   Chest pain    Coronary artery disease    Status post PTCA and stenting of his right coronary artery in March, 2008. 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 THERE IS INFEROBASILAR AKINESIS   CARDIOVASCULAR STRESS TEST  04/21/2010   EF 51%. NO SIGNIFICANT ST SEGMENT CHANGE SUGGESTIVE OF ISCHEMIA   CORONARY ANGIOPLASTY WITH STENT PLACEMENT     STENTING OF HIS RCA     Current Outpatient Medications  Medication Sig Dispense Refill   acetaminophen -codeine  (TYLENOL  #3) 300-30 MG tablet Take 1 tablet by mouth every 4 (four) hours as needed for moderate pain. 30 tablet 0   albuterol  (PROAIR   HFA) 108 (90 Base) MCG/ACT inhaler INHALE 2 PUFFS INTO THE LUNGS EVERY 6 (SIX) HOURS AS NEEDED FOR WHEEZING OR SHORTNESS OF BREATH.  1 each 3   amLODipine  (NORVASC ) 10 MG tablet Take 1 tablet (10 mg total) by mouth daily. Please make PCP appointment. 90 tablet 1   aspirin 81 MG tablet Take 81 mg by mouth daily.     Bempedoic Acid-Ezetimibe  (NEXLIZET ) 180-10 MG TABS Take 1 tablet by mouth daily. 90 tablet 3   carvedilol  (COREG ) 6.25 MG tablet Take 1 tablet (6.25 mg total) by mouth 2 (two) times daily. 60 tablet 0   chlorthalidone  (HYGROTON ) 25 MG tablet Take 1 tablet (25 mg total) by mouth daily. 90 tablet 1   nitroGLYCERIN  (NITROSTAT ) 0.4 MG SL tablet PLACE 1 TABLET UNDER THE TOUNGE EVERY 5MINS AS NEEDED FOR CHEST PAIN 25 tablet 3   pravastatin  (PRAVACHOL ) 80 MG tablet Take 40 mg by mouth every other day.     sildenafil  (VIAGRA ) 100 MG tablet Take 100 mg by mouth daily as needed for erectile dysfunction.     spironolactone  (ALDACTONE ) 50 MG tablet Take 1 tablet (50 mg total) by mouth daily. 90 tablet 1   tiZANidine  (ZANAFLEX ) 4 MG tablet Take 0.5-1 tablets (2-4 mg total) by mouth every 6 (six) hours as needed for muscle spasms. 30 tablet 0   No current facility-administered medications for this visit.    Allergies:   Rosuvastatin  and Atorvastatin     Social History:  The patient  reports that he has been smoking cigarettes. He has a 52 pack-year smoking history. He has never used smokeless tobacco. He reports current alcohol use. He reports current drug use. Drug: Marijuana.   Family History:  The patient's family history includes Hypertension in his brother, daughter, and mother.    ROS:  Noted in current hx      Physical Exam: There were no vitals taken for this visit.  No BP recorded.  {Refresh Note OR Click here to enter BP  :1}***    GEN:  Well nourished, well developed in no acute distress HEENT: Normal NECK: No JVD; No carotid bruits LYMPHATICS: No lymphadenopathy CARDIAC:  RRR ***, no murmurs, rubs, gallops RESPIRATORY:  Clear to auscultation without rales, wheezing or rhonchi  ABDOMEN: Soft, non-tender, non-distended MUSCULOSKELETAL:  No edema; No deformity  SKIN: Warm and dry NEUROLOGIC:  Rios and oriented x 3    EKG:           Recent Labs: 01/12/2023: ALT 18    Lipid Panel    Component Value Date/Time   CHOL 225 (H) 01/12/2023 1216   TRIG 109 01/12/2023 1216   HDL 49 01/12/2023 1216   CHOLHDL 4.6 01/12/2023 1216   CHOLHDL 3.2 10/27/2015 1132   VLDL 32 (H) 10/27/2015 1132   LDLCALC 156 (H) 01/12/2023 1216   LDLDIRECT 137.2 01/20/2013 1016      Wt Readings from Last 3 Encounters:  06/01/23 149 lb (67.6 kg)  02/13/23 148 lb 2.4 oz (67.2 kg)  05/29/22 148 lb 3.2 oz (67.2 kg)     Other studies Reviewed: Additional studies/ records that were reviewed today include: . Review of the above records demonstrates:    ASSESSMENT AND PLAN:  1. History of coronary artery disease-            2.  Hypertensive heart disease without congestive heart failure      .   3. Hyperlipidemia -          Current medicines are reviewed at length with the patient today.  The patient does not have concerns regarding medicines.  The following changes have been made:  no change  Labs/ tests ordered today include:   No orders of the defined types were placed in this encounter.      Disposition:        Javier Rios  11/29/2023 7:21 PM    [facility] [address] [place], [place], [place]  [address] Phone: [phone]; Fax: [phone]

## 2023-12-03 ENCOUNTER — Ambulatory Visit: Payer: Medicare HMO | Attending: Cardiovascular Disease | Admitting: Cardiovascular Disease

## 2023-12-03 ENCOUNTER — Encounter: Payer: Self-pay | Admitting: Cardiovascular Disease

## 2023-12-03 VITALS — BP 138/65 | HR 68 | Ht 69.0 in | Wt 140.8 lb

## 2023-12-03 DIAGNOSIS — I2510 Atherosclerotic heart disease of native coronary artery without angina pectoris: Secondary | ICD-10-CM

## 2023-12-03 DIAGNOSIS — E785 Hyperlipidemia, unspecified: Secondary | ICD-10-CM | POA: Diagnosis not present

## 2023-12-03 DIAGNOSIS — I10 Essential (primary) hypertension: Secondary | ICD-10-CM

## 2023-12-03 DIAGNOSIS — E782 Mixed hyperlipidemia: Secondary | ICD-10-CM

## 2023-12-03 LAB — LIPID PANEL

## 2023-12-03 NOTE — Patient Instructions (Signed)
  Lab Work: Lipids, ALT, BMET today If you have labs (blood work) drawn today and your tests are completely normal, you will receive your results only by: MyChart Message (if you have MyChart) OR A paper copy in the mail If you have any lab test that is abnormal or we need to change your treatment, we will call you to review the results.  Follow-Up: At [facility], you and your health needs are our priority.  As part of our continuing mission to provide you with exceptional heart care, our providers are all part of one team.  This team includes your primary Cardiologist (physician) and Advanced Practice Providers or APPs (Physician Assistants and Nurse Practitioners) who all work together to provide you with the care you need, when you need it.  Your next appointment:   1 year(s)  Provider:   Ahmad Alert, MD

## 2023-12-04 LAB — LIPID PANEL
Cholesterol, Total: 212 mg/dL — ABNORMAL HIGH (ref 100–199)
HDL: 52 mg/dL (ref >39)
LDL CALC COMMENT:: 4.1 ratio (ref 0.0–5.0)
LDL Chol Calc (NIH): 144 mg/dL — ABNORMAL HIGH (ref 0–99)
Triglycerides: 89 mg/dL (ref 0–149)
VLDL Cholesterol Cal: 16 mg/dL (ref 5–40)

## 2023-12-04 LAB — BASIC METABOLIC PANEL WITH GFR
BUN/Creatinine Ratio: 15 (ref 10–24)
BUN: 19 mg/dL (ref 8–27)
CO2: 22 mmol/L (ref 20–29)
Calcium: 10.2 mg/dL (ref 8.6–10.2)
Chloride: 96 mmol/L (ref 96–106)
Creatinine, Ser: 1.27 mg/dL (ref 0.76–1.27)
Glucose: 108 mg/dL — ABNORMAL HIGH (ref 70–99)
Potassium: 5.2 mmol/L (ref 3.5–5.2)
Sodium: 135 mmol/L (ref 134–144)
eGFR: 60 mL/min/{1.73_m2} (ref >59)

## 2023-12-04 LAB — ALT: ALT: 24 IU/L (ref 0–44)

## 2023-12-08 ENCOUNTER — Ambulatory Visit: Payer: Self-pay | Admitting: Cardiovascular Disease

## 2023-12-08 DIAGNOSIS — E785 Hyperlipidemia, unspecified: Secondary | ICD-10-CM

## 2023-12-12 NOTE — Telephone Encounter (Signed)
-----   Message from Aleene Passe sent at 12/08/2023  2:17 PM EDT ----- Glucose remains mildly elevated.  LDL remains elevated . Please refer to the lipid clinic for further advice on his lipid management  ALT is normal   ----- Message ----- From: Interface, [facility] Lab Results In Sent: 12/03/2023  11:36 PM EDT To: Aleene JINNY Passe, MD

## 2023-12-12 NOTE — Telephone Encounter (Signed)
 Pt is returning call.

## 2023-12-12 NOTE — Telephone Encounter (Signed)
 Left detailed message for patient per DPR. Referral placed to lipid clinic.

## 2024-01-31 ENCOUNTER — Ambulatory Visit: Attending: Cardiology | Admitting: Pharmacist

## 2024-01-31 ENCOUNTER — Encounter: Payer: Self-pay | Admitting: Pharmacist

## 2024-01-31 DIAGNOSIS — M791 Myalgia, unspecified site: Secondary | ICD-10-CM | POA: Diagnosis not present

## 2024-01-31 DIAGNOSIS — T466X5D Adverse effect of antihyperlipidemic and antiarteriosclerotic drugs, subsequent encounter: Secondary | ICD-10-CM

## 2024-01-31 DIAGNOSIS — Z716 Tobacco abuse counseling: Secondary | ICD-10-CM

## 2024-01-31 DIAGNOSIS — I700 Atherosclerosis of aorta: Secondary | ICD-10-CM | POA: Diagnosis not present

## 2024-01-31 DIAGNOSIS — I2510 Atherosclerotic heart disease of native coronary artery without angina pectoris: Secondary | ICD-10-CM

## 2024-01-31 DIAGNOSIS — I10 Essential (primary) hypertension: Secondary | ICD-10-CM

## 2024-01-31 DIAGNOSIS — E785 Hyperlipidemia, unspecified: Secondary | ICD-10-CM | POA: Diagnosis not present

## 2024-01-31 NOTE — Patient Instructions (Addendum)
 It was nice meeting you today  The medication we discussed today is called Repatha , which is an injection you would take once every 2 weeks  We will hold off for now and let you increase your Nexlizet  to three times a week  I have placed a lab order for you to have your fasting lipid panel updated around 9/24  If elevated I will call you and we can discuss  Medford Bolk, PharmD, BCACP, CDCES, CPP [facility] [address], [place], [place] [address] Phone: [phone]; Fax: [phone] 01/31/2024 9:52 AM

## 2024-01-31 NOTE — Progress Notes (Signed)
 Patient ID: Javier Rios                 DOB: 1950-09-23                    MRN: [id number]     HPI: Javier Rios is a 73 y.o. male patient referred to lipid clinic by Dr Alveta. Does not have a new cardiologist yet but has seen Rosaline Bane in past. PMH is significant for CAD, HTN, COPD, HLD, smoking, and statin intolerance.  Patient presents today to discuss lipid management. Rosuvastatin  and Atorvastatin  both caused severe muscle cramping. Was switched to Nexlezet which he was able to tolerate for 1 week until his muscles began cramping. Has been now taking twice weekly.  Has reduced smoking from 1ppd to 1/2 ppd.   Patient reports a fear of needles  Current Medications: Nexlizet   Intolerances: Rosuvastatin  Atorvastatin  Pravastatin    Risk Factors:  CAD Smoking HTN Hx of MI  LDL goal: <55  Labs: TC 212, Trigs 89, HDL 52, LDL 144 (12/03/23)  Past Medical History:  Diagnosis Date   Chest pain    Coronary artery disease    Status post PTCA and stenting of his right coronary artery in March, 2008. He also has a 40-50% LAD stenosis   Hypertension    LAD stenosis    MODERATE BETWEEN 50-60%   MI, old 2008   INFERIOR WALL    Current Outpatient Medications on File Prior to Visit  Medication Sig Dispense Refill   meloxicam (MOBIC) 15 MG tablet Take 15 mg by mouth daily.     acetaminophen -codeine  (TYLENOL  #3) 300-30 MG tablet Take 1 tablet by mouth every 4 (four) hours as needed for moderate pain. (Patient not taking: Reported on 12/03/2023) 30 tablet 0   albuterol  (PROAIR  HFA) 108 (90 Base) MCG/ACT inhaler INHALE 2 PUFFS INTO THE LUNGS EVERY 6 (SIX) HOURS AS NEEDED FOR WHEEZING OR SHORTNESS OF BREATH. 1 each 3   amLODipine  (NORVASC ) 10 MG tablet Take 1 tablet (10 mg total) by mouth daily. Please make PCP appointment. 90 tablet 1   aspirin 81 MG tablet Take 81 mg by mouth daily.     Bempedoic Acid-Ezetimibe  (NEXLIZET ) 180-10 MG TABS Take 1 tablet by mouth daily. (Patient not  taking: Reported on 12/03/2023) 90 tablet 3   carvedilol  (COREG ) 6.25 MG tablet Take 1 tablet (6.25 mg total) by mouth 2 (two) times daily. 60 tablet 0   chlorthalidone  (HYGROTON ) 25 MG tablet Take 1 tablet (25 mg total) by mouth daily. 90 tablet 1   nitroGLYCERIN  (NITROSTAT ) 0.4 MG SL tablet PLACE 1 TABLET UNDER THE TOUNGE EVERY 5MINS AS NEEDED FOR CHEST PAIN 25 tablet 3   pravastatin  (PRAVACHOL ) 80 MG tablet Take 40 mg by mouth every other day. (Patient not taking: Reported on 12/03/2023)     sildenafil  (VIAGRA ) 100 MG tablet Take 100 mg by mouth daily as needed for erectile dysfunction.     spironolactone  (ALDACTONE ) 50 MG tablet Take 1 tablet (50 mg total) by mouth daily. 90 tablet 1   tiZANidine  (ZANAFLEX ) 4 MG tablet Take 0.5-1 tablets (2-4 mg total) by mouth every 6 (six) hours as needed for muscle spasms. (Patient not taking: Reported on 12/03/2023) 30 tablet 0   No current facility-administered medications on file prior to visit.    Allergies  Allergen Reactions   Rosuvastatin  Other (See Comments)    Muscle aches    Atorvastatin      Muscle aches  Assessment/Plan:  1. Hyperlipidemia -  Patient's last LDL of 144 is above goal of <55.  Aggressive goal due to history of MI and cigarette smoking.   Advised next steps in lipid management would be a PCSK9i or inclisiran. Using demo pen, educated patient on mechansim of action, storage, site selection, administration, and possible adverse effects. Patient very hesistant to self inject due to fear of needles. Advised Leqvio was another option and wa administered by a RN but patient also hesitant. Patient prefers to try to slowly titrate Nexlizet  up (starting with 3x a week) and recheck lipid panel in 1 month to check effectiveness.  Lab orders placed.  Restart Nexlizet  180-10mg  starting 3x weekly and titrating up as tolerated.  Chris Aerilynn Goin, PharmD, BCACP, CDCES, CPP [facility] [address] [place], [place] [address] Phone:  [phone]; Fax: [phone] 01/31/2024 1:16 PM

## 2024-03-20 ENCOUNTER — Telehealth: Payer: Self-pay

## 2024-03-20 NOTE — Telephone Encounter (Signed)
   Pre-operative Risk Assessment    Patient Name: Phi Avans  DOB: Apr 04, 1951 MRN: [id number]   Date of last office visit: 12/03/23 ALEENE PASSE, MD Date of next office visit: NONE   Request for Surgical Clearance    Procedure:  RIGHT SHOULDER SCOPE, ROTATOR CUFF REPAIR  Date of Surgery:  Clearance TBD                                Surgeon:  DR BONNER HAIR Surgeon's Group or Practice Name:  [facility] Phone number:  [phone] Fax number:  [phone]  ATTN: MAEOLA DIVERS   Type of Clearance Requested:   - Medical  - Pharmacy:  Hold Aspirin     Type of Anesthesia:  General ,  INTERSCALENE BLOCK   Additional requests/questions:    Signed, Lucie DELENA Ku   03/20/2024, 5:41 PM

## 2024-03-21 ENCOUNTER — Telehealth: Payer: Self-pay

## 2024-03-21 NOTE — Telephone Encounter (Signed)
 Attempted to call patient to set up an televisit appointment, no answer, left a vm to call back

## 2024-03-21 NOTE — Telephone Encounter (Signed)
  Patient Consent for Virtual Visit         Javier Rios has provided verbal consent on 03/21/2024 for a virtual visit (video or telephone). Appointment is scheduled for 04/02/2024 @ 3:20pm. Med req and consent are complete.    CONSENT FOR VIRTUAL VISIT FOR:  Javier Rios  By participating in this virtual visit I agree to the following:  I hereby voluntarily request, consent and authorize [facility] and its employed or contracted physicians, physician assistants, nurse practitioners or other licensed health care professionals (the Practitioner), to provide me with telemedicine health care services (the "Services) as deemed necessary by the treating Practitioner. I acknowledge and consent to receive the Services by the Practitioner via telemedicine. I understand that the telemedicine visit will involve communicating with the Practitioner through live audiovisual communication technology and the disclosure of certain medical information by electronic transmission. I acknowledge that I have been given the opportunity to request an in-person assessment or other available alternative prior to the telemedicine visit and am voluntarily participating in the telemedicine visit.  I understand that I have the right to withhold or withdraw my consent to the use of telemedicine in the course of my care at any time, without affecting my right to future care or treatment, and that the Practitioner or I may terminate the telemedicine visit at any time. I understand that I have the right to inspect all information obtained and/or recorded in the course of the telemedicine visit and may receive copies of available information for a reasonable fee.  I understand that some of the potential risks of receiving the Services via telemedicine include:  Delay or interruption in medical evaluation due to technological equipment failure or disruption; Information transmitted may not be sufficient (e.g. poor  resolution of images) to allow for appropriate medical decision making by the Practitioner; and/or  In rare instances, security protocols could fail, causing a breach of personal health information.  Furthermore, I acknowledge that it is my responsibility to provide information about my medical history, conditions and care that is complete and accurate to the best of my ability. I acknowledge that Practitioner's advice, recommendations, and/or decision may be based on factors not within their control, such as incomplete or inaccurate data provided by me or distortions of diagnostic images or specimens that may result from electronic transmissions. I understand that the practice of medicine is not an exact science and that Practitioner makes no warranties or guarantees regarding treatment outcomes. I acknowledge that a copy of this consent can be made available to me via my patient portal [facility] MyChart), or I can request a printed copy by calling the office of [facility].    I understand that my insurance will be billed for this visit.   I have read or had this consent read to me. I understand the contents of this consent, which adequately explains the benefits and risks of the Services being provided via telemedicine.  I have been provided ample opportunity to ask questions regarding this consent and the Services and have had my questions answered to my satisfaction. I give my informed consent for the services to be provided through the use of telemedicine in my medical care

## 2024-03-21 NOTE — Telephone Encounter (Signed)
 Pt returning call. Please advise.

## 2024-03-21 NOTE — Telephone Encounter (Signed)
   Name: Javier Rios  DOB: 1950/08/10  MRN: [id number]  Primary Cardiologist: Aleene Passe, MD (Inactive)   Preoperative team, please contact this patient and set up a phone call appointment for further preoperative risk assessment. Please obtain consent and complete medication review. Thank you for your help.  I confirm that guidance regarding antiplatelet and oral anticoagulation therapy has been completed and, if necessary, noted below.  Per office protocol, if patient is without any new symptoms or concerns at the time of their virtual visit, he may hold ASA for 7 days prior to procedure. Please resume ASA as soon as possible postprocedure, at the discretion of the surgeon.    I also confirmed the patient resides in the state of [place] . As per [facility] Medical Board telemedicine laws, the patient must reside in the state in which the provider is licensed.   Lamarr Satterfield, NP 03/21/2024, 7:54 AM [facility]

## 2024-04-02 ENCOUNTER — Ambulatory Visit: Attending: Cardiology | Admitting: Nurse Practitioner

## 2024-04-02 DIAGNOSIS — Z01810 Encounter for preprocedural cardiovascular examination: Secondary | ICD-10-CM

## 2024-04-02 NOTE — Progress Notes (Signed)
 Virtual Visit via Telephone Note   Because of Calin Fantroy co-morbid illnesses, he is at least at moderate risk for complications without adequate follow up.  This format is felt to be most appropriate for this patient at this time.  Due to technical limitations with video connection (technology), today's appointment will be conducted as an audio only telehealth visit, and Javier Rios verbally agreed to proceed in this manner.   All issues noted in this document were discussed and addressed.  No physical exam could be performed with this format.  Evaluation Performed:  Preoperative cardiovascular risk assessment _____________   Date:  04/02/2024   Patient ID:  Javier Rios, DOB 27-Sep-1950, MRN [id number] Patient Location:  Home Provider location:   Office  Primary Care Provider:  Campbell Reynolds, NP Primary Cardiologist:  Aleene Passe, MD (Inactive)  Chief Complaint / Patient Profile   73 y.o. y/o male with a h/o CAD s/p prior stenting-RCA, also with moderate LAD stenosis, hypertension, and hyperlipidemia, who is pending RIGHT SHOULDER SCOPE, ROTATOR CUFF REPAIR with Dr. Bonner Hair of [facility] and presents today for telephonic preoperative cardiovascular risk assessment.  History of Present Illness    Javier Rios is a 73 y.o. male who presents via audio/video conferencing for a telehealth visit today.  Pt was last seen in cardiology clinic on 12/03/2023 by Dr. Passe.  At that time Javier Rios was doing well. The patient is now pending procedure as outlined above. Since his last visit, he has been stable from a cardiac standpoint.   He denies chest pain, palpitations, dyspnea, pnd, orthopnea, n, v, dizziness, syncope, edema, weight gain, or early satiety. All other systems reviewed and are otherwise negative except as noted above.   Past Medical History    Past Medical History:  Diagnosis Date   Chest pain    Coronary artery disease    Status post PTCA and stenting of his  right coronary artery in March, 2008. He also has a 40-50% LAD stenosis   Hypertension    LAD stenosis    MODERATE BETWEEN 50-60%   MI, old 2008   INFERIOR WALL   Past Surgical History:  Procedure Laterality Date   CARDIAC CATHETERIZATION  08/23/2006   EF 65%. THERE IS INFEROBASILAR AKINESIS   CARDIOVASCULAR STRESS TEST  04/21/2010   EF 51%. NO SIGNIFICANT ST SEGMENT CHANGE SUGGESTIVE OF ISCHEMIA   CORONARY ANGIOPLASTY WITH STENT PLACEMENT     STENTING OF HIS RCA    Allergies  Allergies  Allergen Reactions   Rosuvastatin  Other (See Comments)    Muscle aches    Atorvastatin      Muscle aches    Home Medications    Prior to Admission medications   Medication Sig Start Date End Date Taking? Authorizing Provider  acetaminophen -codeine  (TYLENOL  #3) 300-30 MG tablet Take 1 tablet by mouth every 4 (four) hours as needed for moderate pain. Patient not taking: Reported on 12/03/2023 05/25/21   Theotis Haze ORN, NP  albuterol  (PROAIR  HFA) 108 (90 Base) MCG/ACT inhaler INHALE 2 PUFFS INTO THE LUNGS EVERY 6 (SIX) HOURS AS NEEDED FOR WHEEZING OR SHORTNESS OF BREATH. 06/15/21   Fleming, Zelda W, NP  amLODipine  (NORVASC ) 10 MG tablet Take 1 tablet (10 mg total) by mouth daily. Please make PCP appointment. 06/15/21   Fleming, Zelda W, NP  aspirin 81 MG tablet Take 81 mg by mouth daily.    [provider]  Bempedoic Acid-Ezetimibe  (NEXLIZET ) 180-10 MG TABS Take 1 tablet by mouth daily. 01/31/24  Pavero, Lonni, [facility]  carvedilol  (COREG ) 6.25 MG tablet Take 1 tablet (6.25 mg total) by mouth 2 (two) times daily. 11/02/21   Newlin, Enobong, MD  chlorthalidone  (HYGROTON ) 25 MG tablet Take 1 tablet (25 mg total) by mouth daily. 06/15/21 12/03/23  Fleming, Zelda W, NP  meloxicam (MOBIC) 15 MG tablet Take 15 mg by mouth daily. 12/07/23   [provider]  nitroGLYCERIN  (NITROSTAT ) 0.4 MG SL tablet PLACE 1 TABLET UNDER THE TOUNGE EVERY 5MINS AS NEEDED FOR CHEST PAIN 12/12/19   Fleming, Zelda W,  NP  sildenafil  (VIAGRA ) 100 MG tablet Take 100 mg by mouth daily as needed for erectile dysfunction. 02/22/22   [provider]  spironolactone  (ALDACTONE ) 50 MG tablet Take 1 tablet (50 mg total) by mouth daily. 06/15/21 12/03/23  Fleming, Zelda W, NP  tiZANidine  (ZANAFLEX ) 4 MG tablet Take 0.5-1 tablets (2-4 mg total) by mouth every 6 (six) hours as needed for muscle spasms. Patient not taking: Reported on 12/03/2023 02/05/23   Billy Asberry FALCON, PA-C    Physical Exam    Vital Signs:  Javier Rios does not have vital signs available for review today.  Given telephonic nature of communication, physical exam is limited. AAOx3. NAD. Normal affect.  Speech and respirations are unlabored.  Accessory Clinical Findings    None  Assessment & Plan    1.  Preoperative Cardiovascular Risk Assessment:  According to the Revised Cardiac Risk Index (RCRI), his Perioperative Risk of Major Cardiac Event is (%): 0.9. His Functional Capacity in METs is: 7.01 according to the Duke Activity Status Index (DASI). Therefore, based on ACC/AHA guidelines, patient would be at acceptable risk for the planned procedure without further cardiovascular testing.  The patient was advised that if he develops new symptoms prior to surgery to contact our office to arrange for a follow-up visit, and he verbalized understanding.  Per office protocol, he may hold ASA for 5-7 days prior to procedure. Please resume ASA as soon as possible postprocedure, at the discretion of the surgeon.    A copy of this note will be routed to requesting surgeon.  Time:   Today, I have spent 5 minutes with the patient with telehealth technology discussing medical history, symptoms, and management plan.     Damien JAYSON Braver, NP  04/02/2024, 3:33 PM

## 2024-04-21 ENCOUNTER — Other Ambulatory Visit: Payer: Self-pay

## 2024-04-21 ENCOUNTER — Encounter (HOSPITAL_BASED_OUTPATIENT_CLINIC_OR_DEPARTMENT_OTHER): Payer: Self-pay | Admitting: Orthopaedic Surgery

## 2024-04-23 ENCOUNTER — Encounter (HOSPITAL_BASED_OUTPATIENT_CLINIC_OR_DEPARTMENT_OTHER)
Admission: RE | Admit: 2024-04-23 | Discharge: 2024-04-23 | Disposition: A | Discharge status: Home / Self Care | Source: Ambulatory Visit | Attending: Orthopaedic Surgery | Admitting: Orthopaedic Surgery

## 2024-04-23 DIAGNOSIS — M75121 Complete rotator cuff tear or rupture of right shoulder, not specified as traumatic: Secondary | ICD-10-CM | POA: Diagnosis not present

## 2024-04-23 DIAGNOSIS — M24111 Other articular cartilage disorders, right shoulder: Secondary | ICD-10-CM | POA: Diagnosis present

## 2024-04-23 DIAGNOSIS — S43431A Superior glenoid labrum lesion of right shoulder, initial encounter: Secondary | ICD-10-CM | POA: Diagnosis not present

## 2024-04-23 DIAGNOSIS — I252 Old myocardial infarction: Secondary | ICD-10-CM | POA: Diagnosis not present

## 2024-04-23 DIAGNOSIS — F17210 Nicotine dependence, cigarettes, uncomplicated: Secondary | ICD-10-CM | POA: Diagnosis not present

## 2024-04-23 DIAGNOSIS — I509 Heart failure, unspecified: Secondary | ICD-10-CM | POA: Diagnosis not present

## 2024-04-23 DIAGNOSIS — M7541 Impingement syndrome of right shoulder: Secondary | ICD-10-CM | POA: Diagnosis present

## 2024-04-23 DIAGNOSIS — M7521 Bicipital tendinitis, right shoulder: Secondary | ICD-10-CM | POA: Diagnosis present

## 2024-04-23 DIAGNOSIS — Z7982 Long term (current) use of aspirin: Secondary | ICD-10-CM | POA: Diagnosis not present

## 2024-04-23 DIAGNOSIS — M7551 Bursitis of right shoulder: Secondary | ICD-10-CM | POA: Diagnosis not present

## 2024-04-23 DIAGNOSIS — Z79899 Other long term (current) drug therapy: Secondary | ICD-10-CM | POA: Diagnosis not present

## 2024-04-23 DIAGNOSIS — I2510 Atherosclerotic heart disease of native coronary artery without angina pectoris: Secondary | ICD-10-CM | POA: Diagnosis not present

## 2024-04-23 DIAGNOSIS — X500XXA Overexertion from strenuous movement or load, initial encounter: Secondary | ICD-10-CM | POA: Diagnosis not present

## 2024-04-23 DIAGNOSIS — Z791 Long term (current) use of non-steroidal anti-inflammatories (NSAID): Secondary | ICD-10-CM | POA: Diagnosis not present

## 2024-04-23 DIAGNOSIS — I110 Hypertensive heart disease with heart failure: Secondary | ICD-10-CM | POA: Diagnosis not present

## 2024-04-23 DIAGNOSIS — Z955 Presence of coronary angioplasty implant and graft: Secondary | ICD-10-CM | POA: Diagnosis not present

## 2024-04-23 DIAGNOSIS — J449 Chronic obstructive pulmonary disease, unspecified: Secondary | ICD-10-CM | POA: Diagnosis not present

## 2024-04-23 DIAGNOSIS — M19011 Primary osteoarthritis, right shoulder: Secondary | ICD-10-CM | POA: Diagnosis not present

## 2024-04-23 LAB — BASIC METABOLIC PANEL WITH GFR
Anion gap: 12 (ref 5–15)
BUN: 21 mg/dL (ref 8–23)
CO2: 25 mmol/L (ref 22–32)
Calcium: 9.4 mg/dL (ref 8.9–10.3)
Chloride: 100 mmol/L (ref 98–111)
Creatinine, Ser: 1.53 mg/dL — ABNORMAL HIGH (ref 0.61–1.24)
GFR, Estimated: 48 mL/min — ABNORMAL LOW (ref >60)
Glucose, Bld: 113 mg/dL — ABNORMAL HIGH (ref 70–99)
Potassium: 4 mmol/L (ref 3.5–5.1)
Sodium: 137 mmol/L (ref 135–145)

## 2024-04-23 NOTE — Progress Notes (Signed)
 Pre-surgery benzoyl peroxide and instructions given.

## 2024-04-23 NOTE — Discharge Instructions (Addendum)
 Bonner Hair MD, MPH Aleck Stalling, PA-C [facility] [address] [phone] (tel)   [phone] (fax)   POST-OPERATIVE INSTRUCTIONS - SHOULDER ARTHROSCOPY  WOUND CARE You may remove the Operative Dressing on Post-Op Day #3 (72hrs after surgery).   Alternatively if you would like you can leave dressing on until follow-up if within 7-8 days but keep it dry. Leave steri-strips in place until they fall off on their own, usually 2 weeks postop. There may be a small amount of fluid/bleeding leaking at the surgical site.  This is normal; the shoulder is filled with fluid during the procedure and can leak for 24-48hrs after surgery.  You may change/reinforce the bandage as needed.  Use the Cryocuff or Ice as often as possible for the first 7 days, then as needed for pain relief. Always keep a towel, ACE wrap or other barrier between the cooling unit and your skin.  You may shower on Post-Op Day #3. Gently pat the area dry.  Do not soak the shoulder in water or submerge it.  Keep incisions as dry as possible. Do not go swimming in the pool or ocean until 4 weeks after surgery or when otherwise instructed.    EXERCISES Wear the sling at all times  You may remove the sling for showering, but keep the arm across the chest or in a secondary sling.     It is normal for your fingers/hand to become more swollen after surgery and discolored from bruising.   This will resolve over the first few weeks usually after surgery. Please continue to ambulate and do not stay sitting or lying for too long.  Perform foot and wrist pumps to assist in circulation.  PHYSICAL THERAPY - No therapy for 4 weeks after surgery   REGIONAL ANESTHESIA (NERVE BLOCKS) The anesthesia team may have performed a nerve block for you this is a great tool used to minimize pain.   The block may start wearing off overnight (between 8-24 hours postop) When the block wears off, your pain may  go from nearly zero to the pain you would have had postop without the block. This is an abrupt transition but nothing dangerous is happening.   This can be a challenging period but utilize your as needed pain medications to try and manage this period. We suggest you use the pain medication the first night prior to going to bed, to ease this transition.  You may take an extra dose of narcotic when this happens if needed  POST-OP MEDICATIONS- Multimodal approach to pain control In general your pain will be controlled with a combination of substances.  Prescriptions unless otherwise discussed are electronically sent to your pharmacy.  This is a carefully made plan we use to minimize narcotic use.     Celebrex - Anti-inflammatory medication taken on a scheduled basis Acetaminophen  - Non-narcotic pain medicine taken on a scheduled basis  Oxycodone - This is a strong narcotic, to be used only on an "as needed" basis for SEVERE pain. Zofran - take as needed for nausea   FOLLOW-UP If you develop a Fever (>=101.5), Redness or Drainage from the surgical incision site, please call our office to arrange for an evaluation. Please call the office to schedule a follow-up appointment for your first post-operative appointment, 7-10 days post-operatively.    HELPFUL INFORMATION   You may be more comfortable sleeping in a semi-seated position the first few nights following surgery.  Keep a pillow propped under  the elbow and forearm for comfort.  If you have a recliner type of chair it might be beneficial.  If not that is fine too, but it would be helpful to sleep propped up with pillows behind your operated shoulder as well under your elbow and forearm.  This will reduce pulling on the suture lines.  When dressing, put your operative arm in the sleeve first.  When getting undressed, take your operative arm out last.  Loose fitting, button-down shirts are recommended.  Often in the first days after surgery you  may be more comfortable keeping your operative arm under your shirt and not through the sleeve.  You may return to work/school in the next couple of days when you feel up to it.  Desk work and typing in the sling is fine.  We suggest you use the pain medication the first night prior to going to bed, in order to ease any pain when the anesthesia wears off. You should avoid taking pain medications on an empty stomach as it will make you nauseous.  You should wean off your narcotic medicines as soon as you are able.  Most patients will be off narcotics before their first postop appointment.   Do not drink alcoholic beverages or take illicit drugs when taking pain medications.  It is against the law to drive while taking narcotics.  In some states it is against the law to drive while your arm is in a sling.   Pain medication may make you constipated.  Below are a few solutions to try in this order: Decrease the amount of pain medication if you aren't having pain. Drink lots of decaffeinated fluids. Drink prune juice and/or eat dried prunes  If the first 3 don't work start with additional solutions Take Colace - an over-the-counter stool softener Take Senokot - an over-the-counter laxative Take Miralax - a stronger over-the-counter laxative  For more information including helpful videos and documents visit our website:   [email or web address]    Post Anesthesia Home Care Instructions  Activity: Get plenty of rest for the remainder of the day. A responsible individual must stay with you for 24 hours following the procedure.  For the next 24 hours, DO NOT: -Drive a car -Advertising copywriter -Drink alcoholic beverages -Take any medication unless instructed by your physician -Make any legal decisions or sign important papers.  Meals: Start with liquid foods such as gelatin or soup. Progress to regular foods as tolerated. Avoid greasy, spicy, heavy foods. If  nausea and/or vomiting occur, drink only clear liquids until the nausea and/or vomiting subsides. Call your physician if vomiting continues.  Special Instructions/Symptoms: Your throat may feel dry or sore from the anesthesia or the breathing tube placed in your throat during surgery. If this causes discomfort, gargle with warm salt water. The discomfort should disappear within 24 hours.  If you had a scopolamine patch placed behind your ear for the management of post- operative nausea and/or vomiting:  1. The medication in the patch is effective for 72 hours, after which it should be removed.  Wrap patch in a tissue and discard in the trash. Wash hands thoroughly with soap and water. 2. You may remove the patch earlier than 72 hours if you experience unpleasant side effects which may include dry mouth, dizziness or visual disturbances. 3. Avoid touching the patch. Wash your hands with soap and water after contact with the patch.   Regional Anesthesia Blocks  1. You may not be able to move  or feel the blocked extremity after a regional anesthetic block. This may last may last from 3-48 hours after placement, but it will go away. The length of time depends on the medication injected and your individual response to the medication. As the nerves start to wake up, you may experience tingling as the movement and feeling returns to your extremity. If the numbness and inability to move your extremity has not gone away after 48 hours, please call your surgeon.   2. The extremity that is blocked will need to be protected until the numbness is gone and the strength has returned. Because you cannot feel it, you will need to take extra care to avoid injury. Because it may be weak, you may have difficulty moving it or using it. You may not know what position it is in without looking at it while the block is in effect.  3. For blocks in the legs and feet, returning to weight bearing and walking needs to be done  carefully. You will need to wait until the numbness is entirely gone and the strength has returned. You should be able to move your leg and foot normally before you try and bear weight or walk. You will need someone to be with you when you first try to ensure you do not fall and possibly risk injury.  4. Bruising and tenderness at the needle site are common side effects and will resolve in a few days.  5. Persistent numbness or new problems with movement should be communicated to the surgeon or the [facility] [phone] [facility] [phone]).Information for Discharge Teaching: EXPAREL (bupivacaine liposome injectable suspension)   Pain relief is important to your recovery. The goal is to control your pain so you can move easier and return to your normal activities as soon as possible after your procedure. Your physician may use several types of medicines to manage pain, swelling, and more.  Your surgeon or anesthesiologist gave you EXPAREL(bupivacaine) to help control your pain after surgery.  EXPAREL is a local anesthetic designed to release slowly over an extended period of time to provide pain relief by numbing the tissue around the surgical site. EXPAREL is designed to release pain medication over time and can control pain for up to 72 hours. Depending on how you respond to EXPAREL, you may require less pain medication during your recovery. EXPAREL can help reduce or eliminate the need for opioids during the first few days after surgery when pain relief is needed the most. EXPAREL is not an opioid and is not addictive. It does not cause sleepiness or sedation.   Important! A teal colored band has been placed on your arm with the date, time and amount of EXPAREL you have received. Please leave this armband in place for the full 96 hours following administration, and then you may remove the band. If you return to the hospital for any reason within 96 hours  following the administration of EXPAREL, the armband provides important information that your health care providers to know, and alerts them that you have received this anesthetic.    Possible side effects of EXPAREL: Temporary loss of sensation or ability to move in the area where medication was injected. Nausea, vomiting, constipation Rarely, numbness and tingling in your mouth or lips, lightheadedness, or anxiety may occur. Call your doctor right away if you think you may be experiencing any of these sensations, or if you have other questions regarding possible side effects.  Follow all other discharge instructions given to you by your surgeon or nurse. Eat a healthy diet and drink plenty of water or other fluids.  *May have Tylenol  today at 12:40pm

## 2024-04-23 NOTE — H&P (Signed)
 PREOPERATIVE H&P  Chief Complaint: right shoulder articular cartilage disorder, Biceps tendinitis, impingement syndrome, rotator cuff tear  HPI: Javier Rios is a 73 y.o. male who is scheduled for, Procedure(s): ARTHROSCOPY, SHOULDER, WITH ROTATOR CUFF REPAIR.   A 73 year old male presents for evaluation of right shoulder pain. The patient reports that the pain originated from years of playing sports and lifting heavy objects, leading to a right rotator cuff tear. He experiences pain when raising his arm, especially when dressing or moving the arm at certain angles. The pain is sometimes spontaneous and sharp. He has previously received a steroid injection, which provided temporary relief. The patient is on blood pressure medication but otherwise reports good health. He works in office manager and driving jobs, which may be impacted by his shoulder condition.   Symptoms are rated as moderate to severe, and have been worsening.  This is significantly impairing activities of daily living.    Please see clinic note for further details on this patient's care.    He has elected for surgical management.   Past Medical History:  Diagnosis Date   Chest pain    Coronary artery disease    Status post PTCA and stenting of his right coronary artery in March, 2008. He also has a 40-50% LAD stenosis   Hypertension    LAD stenosis    MODERATE BETWEEN 50-60%   MI, old 2008   INFERIOR WALL   Past Surgical History:  Procedure Laterality Date   CARDIAC CATHETERIZATION  08/23/2006   EF 65%. THERE IS INFEROBASILAR AKINESIS   CARDIOVASCULAR STRESS TEST  04/21/2010   EF 51%. NO SIGNIFICANT ST SEGMENT CHANGE SUGGESTIVE OF ISCHEMIA   CORONARY ANGIOPLASTY WITH STENT PLACEMENT     STENTING OF HIS RCA   Social History   Socioeconomic History   Marital status: Divorced    Spouse name: Not on file   Number of children: 4   Years of education: Not on file   Highest education level: Not on file   Occupational History   Not on file  Tobacco Use   Smoking status: Every Day    Current packs/day: 1.00    Average packs/day: 1 pack/day for 52.0 years (52.0 ttl pk-yrs)    Types: Cigarettes   Smokeless tobacco: Never  Vaping Use   Vaping status: Never Used  Substance and Sexual Activity   Alcohol use: Yes    Comment: ocassion   Drug use: Yes    Types: Marijuana   Sexual activity: Yes  Other Topics Concern   Not on file  Social History Narrative   Not on file   Social Drivers of Health   Financial Resource Strain: Medium Risk (12/24/2020)   Overall Financial Resource Strain (CARDIA)    Difficulty of Paying Living Expenses: Somewhat hard  Food Insecurity: Food Insecurity Present (12/24/2020)   Hunger Vital Sign    Worried About Running Out of Food in the Last Year: Sometimes true    Ran Out of Food in the Last Year: Sometimes true  Transportation Needs: No Transportation Needs (12/24/2020)   PRAPARE - Administrator, Civil Service (Medical): No    Lack of Transportation (Non-Medical): No  Physical Activity: Insufficiently Active (12/24/2020)   Exercise Vital Sign    Days of Exercise per Week: 3 days    Minutes of Exercise per Session: 30 min  Stress: No Stress Concern Present (12/24/2020)   [employer] of Occupational Health - Occupational Stress Questionnaire  Feeling of Stress : Not at all  Social Connections: Moderately Integrated (12/24/2020)   Social Connection and Isolation Panel    Frequency of Communication with Friends and Family: More than three times a week    Frequency of Social Gatherings with Friends and Family: Once a week    Attends Religious Services: 1 to 4 times per year    Active Member of [employer] or Organizations: Yes    Attends Banker Meetings: 1 to 4 times per year    Marital Status: Divorced   Family History  Problem Relation Age of Onset   Hypertension Mother    Hypertension Brother    Hypertension Daughter     Allergies  Allergen Reactions   Rosuvastatin  Other (See Comments)    Muscle aches    Atorvastatin      Muscle aches   Prior to Admission medications   Medication Sig Start Date End Date Taking? Authorizing Provider  albuterol  (PROAIR  HFA) 108 (90 Base) MCG/ACT inhaler INHALE 2 PUFFS INTO THE LUNGS EVERY 6 (SIX) HOURS AS NEEDED FOR WHEEZING OR SHORTNESS OF BREATH. 06/15/21  Yes Fleming, Zelda W, NP  amLODipine  (NORVASC ) 10 MG tablet Take 1 tablet (10 mg total) by mouth daily. Please make PCP appointment. 06/15/21  Yes Fleming, Zelda W, NP  aspirin 81 MG tablet Take 81 mg by mouth daily.   Yes [provider]  carvedilol  (COREG ) 6.25 MG tablet Take 1 tablet (6.25 mg total) by mouth 2 (two) times daily. 11/02/21  Yes Newlin, Enobong, MD  meloxicam (MOBIC) 15 MG tablet Take 15 mg by mouth daily. 12/07/23  Yes [provider]  acetaminophen -codeine  (TYLENOL  #3) 300-30 MG tablet Take 1 tablet by mouth every 4 (four) hours as needed for moderate pain. Patient not taking: Reported on 12/03/2023 05/25/21   Theotis Haze ORN, NP  Bempedoic Acid-Ezetimibe  (NEXLIZET ) 180-10 MG TABS Take 1 tablet by mouth daily. 01/31/24   Pavero, Lonni, RPH  chlorthalidone  (HYGROTON ) 25 MG tablet Take 1 tablet (25 mg total) by mouth daily. 06/15/21 12/03/23  Fleming, Zelda W, NP  nitroGLYCERIN  (NITROSTAT ) 0.4 MG SL tablet PLACE 1 TABLET UNDER THE TOUNGE EVERY 5MINS AS NEEDED FOR CHEST PAIN 12/12/19   Fleming, Zelda W, NP  sildenafil  (VIAGRA ) 100 MG tablet Take 100 mg by mouth daily as needed for erectile dysfunction. 02/22/22   [provider]  spironolactone  (ALDACTONE ) 50 MG tablet Take 1 tablet (50 mg total) by mouth daily. 06/15/21 12/03/23  Fleming, Zelda W, NP  tiZANidine  (ZANAFLEX ) 4 MG tablet Take 0.5-1 tablets (2-4 mg total) by mouth every 6 (six) hours as needed for muscle spasms. Patient not taking: Reported on 12/03/2023 02/05/23   Billy Asberry FALCON, PA-C    ROS: All other systems have been  reviewed and were otherwise negative with the exception of those mentioned in the HPI and as above.  Physical Exam: General: Alert, no acute distress Cardiovascular: No pedal edema Respiratory: No cyanosis, no use of accessory musculature GI: No organomegaly, abdomen is soft and non-tender Skin: No lesions in the area of chief complaint Neurologic: Sensation intact distally Psychiatric: Patient is competent for consent with normal mood and affect Lymphatic: No axillary or cervical lymphadenopathy  MUSCULOSKELETAL:  Right shoulder range of motion to 170 degrees, symmetric to contralateral side. 4/5 supraspinatus strength, negative AC tenderness to palpation. O'Brien's is positive. Speeds is positive. Bicipital groove tenderness palpation is noted. Positive impingement signs.   Imaging: MRI demonstrates a full thickness supraspinatus tear, fluid around the  biceps, lateral hanging acromial spur with subacromial bursitis.  Assessment: right shoulder articular cartilage disorder, Biceps tendinitis, impingement syndrome, rotator cuff tear  Plan: Plan for Procedure(s): ARTHROSCOPY, SHOULDER, WITH ROTATOR CUFF REPAIR  The risks benefits and alternatives were discussed with the patient including but not limited to the risks of nonoperative treatment, versus surgical intervention including infection, bleeding, nerve injury,  blood clots, cardiopulmonary complications, morbidity, mortality, among others, and they were willing to proceed.   The patient acknowledged the explanation, agreed to proceed with the plan and consent was signed.   Operative Plan: Right shoulder scope with SAD, DCE, BT, RCR Discharge Medications: standard DVT Prophylaxis: none Physical Therapy: outpatient PT Special Discharge needs: Sling (Should bring with him). IceMan   Aleck LOISE Stalling, PA-C  04/23/2024 11:48 AM

## 2024-04-24 ENCOUNTER — Encounter (HOSPITAL_BASED_OUTPATIENT_CLINIC_OR_DEPARTMENT_OTHER)
Admission: RE | Disposition: A | Discharge status: Home / Self Care | Payer: Self-pay | Source: Home / Self Care | Attending: Orthopaedic Surgery

## 2024-04-24 ENCOUNTER — Encounter (HOSPITAL_BASED_OUTPATIENT_CLINIC_OR_DEPARTMENT_OTHER): Payer: Self-pay | Admitting: Orthopaedic Surgery

## 2024-04-24 ENCOUNTER — Other Ambulatory Visit: Payer: Self-pay

## 2024-04-24 ENCOUNTER — Ambulatory Visit (HOSPITAL_BASED_OUTPATIENT_CLINIC_OR_DEPARTMENT_OTHER): Admitting: Anesthesiology

## 2024-04-24 ENCOUNTER — Ambulatory Visit (HOSPITAL_BASED_OUTPATIENT_CLINIC_OR_DEPARTMENT_OTHER)
Admission: RE | Admit: 2024-04-24 | Discharge: 2024-04-24 | Disposition: A | Discharge status: Home / Self Care | Attending: Orthopaedic Surgery | Admitting: Orthopaedic Surgery

## 2024-04-24 DIAGNOSIS — I110 Hypertensive heart disease with heart failure: Secondary | ICD-10-CM

## 2024-04-24 DIAGNOSIS — M7551 Bursitis of right shoulder: Secondary | ICD-10-CM | POA: Insufficient documentation

## 2024-04-24 DIAGNOSIS — F17210 Nicotine dependence, cigarettes, uncomplicated: Secondary | ICD-10-CM | POA: Insufficient documentation

## 2024-04-24 DIAGNOSIS — Z955 Presence of coronary angioplasty implant and graft: Secondary | ICD-10-CM | POA: Insufficient documentation

## 2024-04-24 DIAGNOSIS — M75121 Complete rotator cuff tear or rupture of right shoulder, not specified as traumatic: Secondary | ICD-10-CM | POA: Diagnosis not present

## 2024-04-24 DIAGNOSIS — M7541 Impingement syndrome of right shoulder: Secondary | ICD-10-CM | POA: Insufficient documentation

## 2024-04-24 DIAGNOSIS — Z791 Long term (current) use of non-steroidal anti-inflammatories (NSAID): Secondary | ICD-10-CM | POA: Insufficient documentation

## 2024-04-24 DIAGNOSIS — I509 Heart failure, unspecified: Secondary | ICD-10-CM | POA: Diagnosis not present

## 2024-04-24 DIAGNOSIS — M19011 Primary osteoarthritis, right shoulder: Secondary | ICD-10-CM | POA: Insufficient documentation

## 2024-04-24 DIAGNOSIS — S43431A Superior glenoid labrum lesion of right shoulder, initial encounter: Secondary | ICD-10-CM | POA: Insufficient documentation

## 2024-04-24 DIAGNOSIS — I2510 Atherosclerotic heart disease of native coronary artery without angina pectoris: Secondary | ICD-10-CM | POA: Diagnosis not present

## 2024-04-24 DIAGNOSIS — M7521 Bicipital tendinitis, right shoulder: Secondary | ICD-10-CM | POA: Insufficient documentation

## 2024-04-24 DIAGNOSIS — M24111 Other articular cartilage disorders, right shoulder: Secondary | ICD-10-CM | POA: Insufficient documentation

## 2024-04-24 DIAGNOSIS — M75101 Unspecified rotator cuff tear or rupture of right shoulder, not specified as traumatic: Secondary | ICD-10-CM | POA: Diagnosis not present

## 2024-04-24 DIAGNOSIS — J449 Chronic obstructive pulmonary disease, unspecified: Secondary | ICD-10-CM | POA: Insufficient documentation

## 2024-04-24 DIAGNOSIS — Z7982 Long term (current) use of aspirin: Secondary | ICD-10-CM | POA: Insufficient documentation

## 2024-04-24 DIAGNOSIS — Z01818 Encounter for other preprocedural examination: Secondary | ICD-10-CM

## 2024-04-24 DIAGNOSIS — I252 Old myocardial infarction: Secondary | ICD-10-CM | POA: Insufficient documentation

## 2024-04-24 DIAGNOSIS — X500XXA Overexertion from strenuous movement or load, initial encounter: Secondary | ICD-10-CM | POA: Insufficient documentation

## 2024-04-24 DIAGNOSIS — Z79899 Other long term (current) drug therapy: Secondary | ICD-10-CM | POA: Insufficient documentation

## 2024-04-24 HISTORY — PX: SHOULDER ARTHROSCOPY WITH ROTATOR CUFF REPAIR: SHX5685

## 2024-04-24 SURGERY — ARTHROSCOPY, SHOULDER, WITH ROTATOR CUFF REPAIR
Anesthesia: General | Site: Shoulder | Laterality: Right

## 2024-04-24 MED ORDER — CEFAZOLIN SODIUM-DEXTROSE 2-4 GM/100ML-% IV SOLN
INTRAVENOUS | Status: AC
Start: 2024-04-24 — End: 2024-04-24
  Filled 2024-04-24: qty 100

## 2024-04-24 MED ORDER — MIDAZOLAM HCL 2 MG/2ML IJ SOLN
INTRAMUSCULAR | Status: AC
  Filled 2024-04-24: qty 2

## 2024-04-24 MED ORDER — LIDOCAINE HCL (CARDIAC) PF 100 MG/5ML IV SOSY
PREFILLED_SYRINGE | INTRAVENOUS | Status: DC | PRN
  Administered 2024-04-24: 60 mg via INTRAVENOUS

## 2024-04-24 MED ORDER — ACETAMINOPHEN 500 MG PO TABS
ORAL_TABLET | ORAL | Status: AC
  Filled 2024-04-24: qty 2

## 2024-04-24 MED ORDER — LIDOCAINE 2% (20 MG/ML) 5 ML SYRINGE
INTRAMUSCULAR | Status: AC
  Filled 2024-04-24: qty 5

## 2024-04-24 MED ORDER — EPHEDRINE 5 MG/ML INJ
INTRAVENOUS | Status: AC
  Filled 2024-04-24: qty 5

## 2024-04-24 MED ORDER — EPHEDRINE SULFATE (PRESSORS) 25 MG/5ML IV SOSY
PREFILLED_SYRINGE | INTRAVENOUS | Status: DC | PRN
  Administered 2024-04-24: 10 mg via INTRAVENOUS
  Administered 2024-04-24: 5 mg via INTRAVENOUS
  Administered 2024-04-24: 10 mg via INTRAVENOUS

## 2024-04-24 MED ORDER — ROCURONIUM BROMIDE 100 MG/10ML IV SOLN
INTRAVENOUS | Status: DC | PRN
  Administered 2024-04-24: 50 mg via INTRAVENOUS

## 2024-04-24 MED ORDER — EPINEPHRINE PF 1 MG/ML IJ SOLN
INTRAMUSCULAR | Status: AC
  Filled 2024-04-24: qty 1

## 2024-04-24 MED ORDER — SODIUM CHLORIDE 0.9 % IR SOLN
Status: DC | PRN
  Administered 2024-04-24: 1 mL

## 2024-04-24 MED ORDER — LACTATED RINGERS IV SOLN: INTRAVENOUS | Status: DC

## 2024-04-24 MED ORDER — GLYCOPYRROLATE PF 0.2 MG/ML IJ SOSY
PREFILLED_SYRINGE | INTRAMUSCULAR | Status: AC
  Filled 2024-04-24: qty 1

## 2024-04-24 MED ORDER — TRANEXAMIC ACID-NACL 1000-0.7 MG/100ML-% IV SOLN
1000.0000 mg | INTRAVENOUS | Status: AC
  Administered 2024-04-24: 1000 mg via INTRAVENOUS

## 2024-04-24 MED ORDER — MIDAZOLAM HCL (PF) 2 MG/2ML IJ SOLN
1.0000 mg | Freq: Once | INTRAMUSCULAR | Status: AC
  Administered 2024-04-24: 1 mg via INTRAVENOUS

## 2024-04-24 MED ORDER — ONDANSETRON HCL 4 MG/2ML IJ SOLN
INTRAMUSCULAR | Status: DC | PRN
  Administered 2024-04-24: 4 mg via INTRAVENOUS

## 2024-04-24 MED ORDER — TRANEXAMIC ACID-NACL 1000-0.7 MG/100ML-% IV SOLN
INTRAVENOUS | Status: AC
Start: 2024-04-24 — End: 2024-04-24
  Filled 2024-04-24: qty 100

## 2024-04-24 MED ORDER — FENTANYL CITRATE (PF) 100 MCG/2ML IJ SOLN
50.0000 ug | Freq: Once | INTRAMUSCULAR | Status: AC
  Administered 2024-04-24: 50 ug via INTRAVENOUS

## 2024-04-24 MED ORDER — GABAPENTIN 300 MG PO CAPS
ORAL_CAPSULE | ORAL | Status: AC
  Filled 2024-04-24: qty 1

## 2024-04-24 MED ORDER — FENTANYL CITRATE (PF) 100 MCG/2ML IJ SOLN
INTRAMUSCULAR | Status: AC
  Filled 2024-04-24: qty 2

## 2024-04-24 MED ORDER — ONDANSETRON HCL 4 MG PO TABS: 4.0000 mg | ORAL_TABLET | Freq: Three times a day (TID) | ORAL | 0 refills | Status: AC | PRN

## 2024-04-24 MED ORDER — DROPERIDOL 2.5 MG/ML IJ SOLN: 0.6250 mg | Freq: Once | INTRAMUSCULAR | Status: DC | PRN

## 2024-04-24 MED ORDER — SODIUM CHLORIDE (PF) 0.9 % IJ SOLN
INTRAMUSCULAR | Status: DC | PRN
  Administered 2024-04-24: 3000 mL

## 2024-04-24 MED ORDER — CEFAZOLIN SODIUM-DEXTROSE 2-4 GM/100ML-% IV SOLN
2.0000 g | INTRAVENOUS | Status: AC
Start: 2024-04-24 — End: 2024-04-24
  Administered 2024-04-24: 2 g via INTRAVENOUS

## 2024-04-24 MED ORDER — PROPOFOL 500 MG/50ML IV EMUL
INTRAVENOUS | Status: AC
  Filled 2024-04-24: qty 50

## 2024-04-24 MED ORDER — PHENYLEPHRINE 80 MCG/ML (10ML) SYRINGE FOR IV PUSH (FOR BLOOD PRESSURE SUPPORT)
PREFILLED_SYRINGE | INTRAVENOUS | Status: AC
Start: 2024-04-24 — End: 2024-04-24
  Filled 2024-04-24: qty 10

## 2024-04-24 MED ORDER — OXYCODONE HCL 5 MG PO TABS: ORAL_TABLET | ORAL | 0 refills | Status: AC

## 2024-04-24 MED ORDER — BUPIVACAINE LIPOSOME 1.3 % IJ SUSP
INTRAMUSCULAR | Status: DC | PRN
  Administered 2024-04-24: 10 mL via PERINEURAL

## 2024-04-24 MED ORDER — PROPOFOL 10 MG/ML IV BOLUS
INTRAVENOUS | Status: DC | PRN
  Administered 2024-04-24: 50 mg via INTRAVENOUS
  Administered 2024-04-24: 150 mg via INTRAVENOUS

## 2024-04-24 MED ORDER — GABAPENTIN 300 MG PO CAPS
300.0000 mg | ORAL_CAPSULE | Freq: Once | ORAL | Status: AC
  Administered 2024-04-24: 300 mg via ORAL

## 2024-04-24 MED ORDER — PHENYLEPHRINE HCL (PRESSORS) 10 MG/ML IV SOLN
INTRAVENOUS | Status: DC | PRN
  Administered 2024-04-24 (×6): 80 ug via INTRAVENOUS
  Administered 2024-04-24: 160 ug via INTRAVENOUS
  Administered 2024-04-24: 80 ug via INTRAVENOUS

## 2024-04-24 MED ORDER — OXYCODONE HCL 5 MG PO TABS: 5.0000 mg | ORAL_TABLET | Freq: Once | ORAL | Status: DC | PRN

## 2024-04-24 MED ORDER — ACETAMINOPHEN 500 MG PO TABS
1000.0000 mg | ORAL_TABLET | Freq: Once | ORAL | Status: AC
  Administered 2024-04-24: 1000 mg via ORAL

## 2024-04-24 MED ORDER — ROCURONIUM BROMIDE 10 MG/ML (PF) SYRINGE
PREFILLED_SYRINGE | INTRAVENOUS | Status: AC
  Filled 2024-04-24: qty 10

## 2024-04-24 MED ORDER — GLYCOPYRROLATE 0.2 MG/ML IJ SOLN
INTRAMUSCULAR | Status: DC | PRN
  Administered 2024-04-24: .2 mg via INTRAVENOUS

## 2024-04-24 MED ORDER — ONDANSETRON HCL 4 MG/2ML IJ SOLN
INTRAMUSCULAR | Status: AC
  Filled 2024-04-24: qty 2

## 2024-04-24 MED ORDER — ACETAMINOPHEN 500 MG PO TABS: 1000.0000 mg | ORAL_TABLET | Freq: Three times a day (TID) | ORAL | 0 refills | Status: AC

## 2024-04-24 MED ORDER — SUGAMMADEX SODIUM 200 MG/2ML IV SOLN
INTRAVENOUS | Status: DC | PRN
  Administered 2024-04-24: 200 mg via INTRAVENOUS

## 2024-04-24 MED ORDER — BUPIVACAINE HCL (PF) 0.5 % IJ SOLN
INTRAMUSCULAR | Status: DC | PRN
  Administered 2024-04-24: 10 mL via PERINEURAL

## 2024-04-24 MED ORDER — DEXAMETHASONE SODIUM PHOSPHATE 4 MG/ML IJ SOLN
INTRAMUSCULAR | Status: DC | PRN
  Administered 2024-04-24: 5 mg via INTRAVENOUS

## 2024-04-24 MED ORDER — CELECOXIB 100 MG PO CAPS: 100.0000 mg | ORAL_CAPSULE | Freq: Two times a day (BID) | ORAL | 0 refills | Status: AC

## 2024-04-24 MED ORDER — ACETAMINOPHEN 10 MG/ML IV SOLN: 1000.0000 mg | Freq: Once | INTRAVENOUS | Status: DC | PRN

## 2024-04-24 MED ORDER — OXYCODONE HCL 5 MG/5ML PO SOLN: 5.0000 mg | Freq: Once | ORAL | Status: DC | PRN

## 2024-04-24 MED ORDER — FENTANYL CITRATE (PF) 100 MCG/2ML IJ SOLN: 25.0000 ug | INTRAMUSCULAR | Status: DC | PRN

## 2024-04-24 SURGICAL SUPPLY — 48 items
ANCHOR FIBERTAK RC 2.6 (BLUE) (Anchor) IMPLANT
ANCHOR SUT 1.8 FIBERTAK SB KL (Anchor) IMPLANT
ANCHOR SUT BIO SW 4.75X19.1 (Anchor) IMPLANT
ANCHOR SUT FBRTK 2.6X1.7X2 (Anchor) IMPLANT
BLADE EXCALIBUR 4.0X13 (MISCELLANEOUS) ×1 IMPLANT
BURR OVAL 8 FLU 4.0X13 (MISCELLANEOUS) ×1 IMPLANT
CANNULA 5.75X71 LONG (CANNULA) IMPLANT
CANNULA PASSPORT BUTTON 10-40 (CANNULA) IMPLANT
CANNULA TWIST IN 8.25X7CM (CANNULA) IMPLANT
CHLORAPREP W/TINT 26 (MISCELLANEOUS) ×1 IMPLANT
CLSR STERI-STRIP ANTIMIC 1/2X4 (GAUZE/BANDAGES/DRESSINGS) ×1 IMPLANT
COOLER ICEMAN CLASSIC (MISCELLANEOUS) ×1 IMPLANT
DRAPE IMP U-DRAPE 54X76 (DRAPES) ×1 IMPLANT
DRAPE INCISE IOBAN 66X45 STRL (DRAPES) IMPLANT
DRAPE SHOULDER BEACH CHAIR (DRAPES) ×1 IMPLANT
DW OUTFLOW CASSETTE/TUBE SET (MISCELLANEOUS) ×1 IMPLANT
GAUZE PAD ABD 8X10 STRL (GAUZE/BANDAGES/DRESSINGS) ×1 IMPLANT
GAUZE SPONGE 4X4 12PLY STRL (GAUZE/BANDAGES/DRESSINGS) ×1 IMPLANT
GLOVE BIO SURGEON STRL SZ 6.5 (GLOVE) ×1 IMPLANT
GLOVE BIOGEL PI IND STRL 6.5 (GLOVE) ×1 IMPLANT
GLOVE BIOGEL PI IND STRL 8 (GLOVE) ×1 IMPLANT
GLOVE ECLIPSE 8.0 STRL XLNG CF (GLOVE) ×1 IMPLANT
GOWN STRL REUS W/ TWL LRG LVL3 (GOWN DISPOSABLE) ×2 IMPLANT
GOWN STRL REUS W/TWL XL LVL3 (GOWN DISPOSABLE) ×1 IMPLANT
KIT STABILIZATION SHOULDER (MISCELLANEOUS) ×1 IMPLANT
KIT STR SPEAR 1.8 FBRTK DISP (KITS) IMPLANT
LASSO CRESCENT QUICKPASS (SUTURE) IMPLANT
MANIFOLD NEPTUNE II (INSTRUMENTS) ×1 IMPLANT
NDL HD SCORPION MEGA LOADER (NEEDLE) IMPLANT
NDL SAFETY ECLIPSE 18X1.5 (NEEDLE) ×1 IMPLANT
PACK ARTHROSCOPY DSU (CUSTOM PROCEDURE TRAY) ×1 IMPLANT
PACK BASIN DAY SURGERY FS (CUSTOM PROCEDURE TRAY) ×1 IMPLANT
PAD COLD SHLDR WRAP-ON (PAD) ×1 IMPLANT
RESTRAINT HEAD UNIVERSAL NS (MISCELLANEOUS) ×1 IMPLANT
SHEET MEDIUM DRAPE 40X70 STRL (DRAPES) ×1 IMPLANT
SLEEVE SCD COMPRESS KNEE MED (STOCKING) ×1 IMPLANT
SLING ARM FOAM STRAP LRG (SOFTGOODS) IMPLANT
SUT MNCRL AB 4-0 PS2 18 (SUTURE) ×1 IMPLANT
SUT PDS AB 0 CT 36 (SUTURE) IMPLANT
SUT TIGER TAPE 7 IN WHITE (SUTURE) IMPLANT
SUTURE FIBERWR #2 38 T-5 BLUE (SUTURE) IMPLANT
SUTURE TAPE TIGERLINK 1.3MM BL (SUTURE) IMPLANT
SYR 5ML LL (SYRINGE) ×1 IMPLANT
TAPE FIBER 2MM 7IN #2 BLUE (SUTURE) IMPLANT
TOWEL GREEN STERILE FF (TOWEL DISPOSABLE) ×2 IMPLANT
TUBE CONNECTING 20X1/4 (TUBING) ×1 IMPLANT
TUBING ARTHROSCOPY IRRIG 16FT (MISCELLANEOUS) ×1 IMPLANT
WAND ABLATOR APOLLO I90 (BUR) ×1 IMPLANT

## 2024-04-24 NOTE — Anesthesia Procedure Notes (Signed)
 Procedure Name: Intubation Date/Time: 04/24/2024 7:54 AM  Performed by: Pam Macario BROCKS, CRNAPre-anesthesia Checklist: Patient identified, Emergency Drugs available, Suction available, Patient being monitored and Timeout performed Patient Re-evaluated:Patient Re-evaluated prior to induction Oxygen Delivery Method: Circle system utilized Preoxygenation: Pre-oxygenation with 100% oxygen Induction Type: IV induction Ventilation: Mask ventilation without difficulty Laryngoscope Size: Mac and 4 Grade View: Grade II Tube type: Oral Tube size: 7.5 mm Number of attempts: 1 Airway Equipment and Method: Stylet and Oral airway Placement Confirmation: ETT inserted through vocal cords under direct vision, positive ETCO2, breath sounds checked- equal and bilateral and CO2 detector Secured at: 23 cm Tube secured with: Tape Dental Injury: Teeth and Oropharynx as per pre-operative assessment

## 2024-04-24 NOTE — Anesthesia Procedure Notes (Addendum)
 Anesthesia Regional Block: Interscalene brachial plexus block   Pre-Anesthetic Checklist: , timeout performed,  Correct Patient, Correct Site, Correct Laterality,  Correct Procedure, Correct Position, site marked,  Risks and benefits discussed,  Surgical consent,  Pre-op evaluation,  At surgeon's request and post-op pain management  Laterality: Right  Prep: chloraprep       Needles:  Injection technique: Single-shot  Needle Type: Echogenic Needle     Needle Length: 4cm  Needle Gauge: 25     Additional Needles:   Procedures:,,,, ultrasound used (permanent image in chart),,    Narrative:  Start time: 04/24/2024 7:12 AM End time: 04/24/2024 7:14 AM Injection made incrementally with aspirations every 5 mL.  Performed by: Personally  Anesthesiologist: Boone Fess, MD  Additional Notes: Patient's chart reviewed and they were deemed appropriate candidate for procedure, at surgeon's request. Patient educated about risks, benefits, and alternatives of the block including but not limited to: temporary or permanent nerve damage, bleeding, infection, damage to surround tissues, pneumothorax, hemidiaphragmatic paralysis, unilateral Horner's syndrome, block failure, local anesthetic toxicity. Patient expressed understanding. A formal time-out was conducted consistent with institution rules.  Monitors were applied, and minimal sedation used (see nursing record). The site was prepped with skin prep and allowed to dry, and sterile gloves were used. A high frequency linear ultrasound probe with probe cover was utilized throughout. C5-7 nerve roots located and appeared anatomically normal, local anesthetic injected around them, and echogenic block needle trajectory was monitored throughout. Aspiration performed every 5ml. Lung and blood vessels were avoided. All injections were performed without resistance and free of blood and paresthesias. The patient tolerated the procedure well.  Injectate: 10ml  exparel + 10ml 0.5% bupivacaine

## 2024-04-24 NOTE — Anesthesia Postprocedure Evaluation (Signed)
 Anesthesia Post Note  Patient: Javier Rios  Procedure(s) Performed: ARTHROSCOPY, SHOULDER, WITH ROTATOR CUFF REPAIR (Right: Shoulder)     Patient location during evaluation: PACU Anesthesia Type: General Level of consciousness: awake and alert Pain management: pain level controlled Vital Signs Assessment: post-procedure vital signs reviewed and stable Respiratory status: spontaneous breathing, nonlabored ventilation, respiratory function stable and patient connected to nasal cannula oxygen Cardiovascular status: blood pressure returned to baseline and stable Postop Assessment: no apparent nausea or vomiting Anesthetic complications: no   No notable events documented.  Last Vitals:  Vitals:   04/24/24 0945 04/24/24 1003  BP: 138/85 (!) 145/79  Pulse: 64 67  Resp: 19 18  Temp:  36.6 C  SpO2: 95% 93%    Last Pain:  Vitals:   04/24/24 1003  TempSrc:   PainSc: 0-No pain                 Rome Ade

## 2024-04-24 NOTE — Op Note (Signed)
 Orthopaedic Surgery Operative Note (CSN: [id number])  Javier Rios  09-16-1950 Date of Surgery: 04/24/2024   DIAGNOSES: Right shoulder, acute on chronic rotator cuff tear, SLAP tear, biceps tendinitis, AC arthritis, subacromial impingement, and capsulitis and degenerative labral tear, subacromial bursitis.  POST-OPERATIVE DIAGNOSIS: same  PROCEDURE: Arthroscopic extensive debridement - 29823 Subdeltoid Bursa, Supraspinatus Tendon, Anterior Labrum, Superior Labrum, Posterior Labrum, and [facility] L Arthroscopic distal clavicle excision - 70175 Arthroscopic subacromial decompression - 70173 Arthroscopic rotator cuff repair - 70172 Arthroscopic biceps tenodesis - 70171   OPERATIVE FINDING: Exam under anesthesia: Normal Articular space: Anterior and posterior labral tearing debrided back Chondral surfaces: Normal Biceps: Type II SLAP tear Subscapularis: Complete tear  Supraspinatus: Complete tear  Infraspinatus: Intact    Atypical tear of the supra and infraspinatus with a longitudinal split in the tendon and pulled posteriorly.  2 x 2 repair performed but was under some tension and we felt that was appropriate to hold therapy for 4 weeks.  Post-operative plan: The patient will be non-weightbearing in a sling x 6 weeks.  The patient will be discharged home.  DVT prophylaxis not indicated in ambulatory upper extremity patient without known risk factors.   Pain control with PRN pain medication preferring oral medicines.  Follow up plan will be scheduled in approximately 7 days for incision check and XR.  Surgeons:Primary: Cristy Bonner DASEN, MD Assistants:[name] McBane, PA-C Location: [facility] OR ROOM 1 Anesthesia: General with Exparel interscalene block Antibiotics: Ancef 2 g Tourniquet time: None Estimated Blood Loss: Minimal Complications: None Specimens: None Implants: Implant Name Type Inv. Item Serial No. Manufacturer Lot No. LRB No. Used Action  ANCHOR SUT 1.8 FIBERTAK SB KL - [id number]  Anchor ANCHOR SUT 1.8 FIBERTAK SB KL  [employer] [id number] Right 1 Implanted  ANCHOR SUT 1.8 FIBERTAK SB KL - [id number] Anchor ANCHOR SUT 1.8 FIBERTAK SB KL  [employer] [id number] Right 1 Implanted  ANCHOR FIBERTAK RC 2.6 [facility]) - [id number] Anchor ANCHOR FIBERTAK RC 2.6 [facility])  [employer] [id number] Right 1 Implanted  ANCHOR SUT BIO SW 4.75X19.1 - [id number] Anchor ANCHOR SUT BIO SW 4.75X19.1  [employer] [id number] Right 1 Implanted  ANCHOR SUT BIO SW 4.75X19.1 - [id number] Anchor ANCHOR SUT BIO SW 4.75X19.1  [employer] [id number] Right 1 Implanted  ANCHOR SUT FBRTK 2.6X1.CLEMMIE GLENWOOD PAM Anchor ANCHOR SUT FBRTK [id number]  [employer] [id number] Right 1 Implanted    Indications for Surgery:   Javier Rios is a 73 y.o. male with continued shoulder pain refractory to nonoperative measures for extended period of time.    The risks and benefits were explained at length including but not limited to continued pain, cuff failure, biceps tenodesis failure, stiffness, need for further surgery and infection.   Procedure:   Patient was correctly identified in the preoperative holding area and operative site marked.  Patient brought to OR and positioned beachchair on an [place] table ensuring that all bony prominences were padded and the head was in an appropriate location.  Anesthesia was induced and the operative shoulder was prepped and draped in the usual sterile fashion.  Timeout was called preincision.  A standard posterior viewing portal was made after localizing the portal with a spinal needle.  An anterior accessory portal was also made.  After clearing the articular space the camera was positioned in the subacromial space.  Findings above.    Extensive debridement performed of subdeltoid bursa, [facility] L, anterior posterior labrum, rotator cuff and CA ligament.  Subacromial decompression: We made a lateral portal  with spinal needle guidance. We then proceeded to debride bursal tissue extensively with a shaver  and arthrocare device. At that point we continued to identify the borders of the acromion and identify the spur. We then carefully preserved the deltoid fascia and used a burr to convert the acromion to a Type 1 flat acromion without issue.  Subacromial decompression: We made a lateral portal with spinal needle guidance. We then proceeded to debride bursal tissue extensively with a shaver and arthrocare device. At that point we continued to identify the borders of the acromion and identify the spur. We then carefully preserved the deltoid fascia and used a burr to convert the acromion to a Type 1 flat acromion without issue.  Arthroscopic rotator cuff repair: Once the above is complete we used a shaver as well as a bur to prepare the tuberosity and clear soft tissue so that there was bony bleeding and appropriate bloody flecks for healing.  We then placed 2 Medial Row 2.6 mm fiber tack anchors with knotless mechanisms and tape.  We used a scorpion as well as a link suture to pass all of the sutures from each anchor through the cuff.  We then were able to use the knotless mechanism sutures to perform a double medial row tiedown compressing the medial row without overtensioning. Once this was complete we cut the switch sutures and performed a speed bridge type repair pulling the tapes over to 2 lateral row 4.75 mm bio composite swivel locks.  This provided compression of the cuff to the tuberosity.  Biceps tenodesis: We marked the tendon and then performed a tenotomy and debridement of the stump in the articular space. We then identified the biceps tendon in its groove suprapec with the arthroscope in the lateral portal taking care to move from lateral to medial to avoid injury to the subscapularis. At that point we unroofed the tendon itself and mobilized it. An accessory anterior portal was made in line with the tendon and we grasped it from the anterior superior portal and worked from the accessory anterior  portal. Two Fibertak 1.79mm knotless anchors were placed in the groove and the tendon was secured in a luggage loop style fashion with a pass of the limb of suture through the tendon using a scorpion device to avoid pull-through.  Repair was completed with good tension on the tendon.  Residual stump of the tendon was removed after being resected with a RF ablator.  Distal Clavicle resection:  The scope was placed in the subacromial space from the posterior portal.  A hemostat was placed through the anterior portal and we spread at the [facility] joint.  A burr was then inserted and 10 mm of distal clavicle was resected taking care to avoid damage to the capsule around the joint and avoiding overhanging bone posteriorly.    The incisions were closed with absorbable monocryl and steri strips.  A sterile dressing was placed along with a sling. The patient was awoken from general anesthesia and taken to the PACU in stable condition without complication.   Aleck Stalling, PA-C, present and scrubbed throughout the case, critical for completion in a timely fashion, and for retraction, instrumentation, closure.

## 2024-04-24 NOTE — Anesthesia Preprocedure Evaluation (Signed)
 Anesthesia Evaluation  Patient identified by MRN, date of birth, ID band Patient awake    Reviewed: Allergy & Precautions, NPO status , Patient's Chart, lab work & pertinent test results  History of Anesthesia Complications Negative for: history of anesthetic complications  Airway Mallampati: II  TM Distance: >3 FB Neck ROM: Full    Dental  (+) Poor Dentition, Chipped, Missing   Pulmonary neg sleep apnea, COPD, Current Smoker and Patient abstained from smoking.   Pulmonary exam normal breath sounds clear to auscultation       Cardiovascular Exercise Tolerance: Good METShypertension, + CAD, + Past MI, + Cardiac Stents and +CHF  (-) dysrhythmias  Rhythm:Regular Rate:Normal - Systolic murmurs TTE 2019: - Left ventricle: The cavity size was normal. There was mild    concentric hypertrophy. Systolic function was mildly to    moderately reduced. The estimated ejection fraction was in the    range of 40% to 45%. Diffuse hypokinesis. Doppler parameters are    consistent with abnormal left ventricular relaxation (grade 1    diastolic dysfunction). Doppler parameters are consistent with    indeterminate ventricular filling pressure.  - Aortic valve: Transvalvular velocity was within the normal range.    There was no stenosis.  - Mitral valve: Transvalvular velocity was within the normal range.    There was no evidence for stenosis. There was trivial    regurgitation.  - Right ventricle: The cavity size was normal. Wall thickness was    normal. Systolic function was normal.  - Tricuspid valve: There was no regurgitation.    Per cardiologist: According to the Revised Cardiac Risk Index (RCRI), his Perioperative Risk of Major Cardiac Event is (%): 0.9. His Functional Capacity in METs is: 7.01 according to the Duke Activity Status Index (DASI). Therefore, based on ACC/AHA guidelines, patient would be at acceptable risk for the planned  procedure without further cardiovascular testing.   Neuro/Psych negative neurological ROS  negative psych ROS   GI/Hepatic ,neg GERD  ,,(+)     (-) substance abuse    Endo/Other  neg diabetes    Renal/GU negative Renal ROS     Musculoskeletal   Abdominal   Peds  Hematology   Anesthesia Other Findings Past Medical History: No date: Chest pain No date: Coronary artery disease     Comment:  Status post PTCA and stenting of his right coronary               artery in March, 2008. He also has a 40-50% LAD stenosis No date: Hypertension No date: LAD stenosis     Comment:  MODERATE BETWEEN 50-60% 2008: MI, old     Comment:  INFERIOR WALL  Reproductive/Obstetrics                              Anesthesia Physical Anesthesia Plan  ASA: 3  Anesthesia Plan: General   Post-op Pain Management: Regional block*, Tylenol  PO (pre-op)* and Gabapentin PO (pre-op)*   Induction: Intravenous  PONV Risk Score and Plan: 1 and Ondansetron, Dexamethasone, Midazolam and Treatment may vary due to age or medical condition  Airway Management Planned: Oral ETT  Additional Equipment: None  Intra-op Plan:   Post-operative Plan: Extubation in OR  Informed Consent: I have reviewed the patients History and Physical, chart, labs and discussed the procedure including the risks, benefits and alternatives for the proposed anesthesia with the patient or authorized representative who has indicated his/her understanding  and acceptance.     Dental advisory given  Plan Discussed with: CRNA and Surgeon  Anesthesia Plan Comments: (Discussed risks of anesthesia with patient, including PONV, sore throat, lip/dental/eye damage. Rare risks discussed as well, such as cardiorespiratory and neurological sequelae, and allergic reactions. Discussed the role of CRNA in patient's perioperative care. Patient understands. Patient counseled on benefits of smoking cessation, and increased  perioperative risks associated with continued smoking.  Discussed r/b/a of interscalene block, including elective nature. Risks discussed: - Rare: bleeding, infection, nerve damage - shortness of breath from hemidiaphragmatic paralysis - unilateral horner's syndrome - poor/non-working blocks - reactions and toxicity to local anesthetic Patient understands and agrees. )        Anesthesia Quick Evaluation

## 2024-04-24 NOTE — Progress Notes (Signed)
 Assisted Dr. Rome Ade with right, interscalene , ultrasound guided block. Side rails up, monitors on throughout procedure. See vital signs in flow sheet. Tolerated Procedure well.

## 2024-04-24 NOTE — Transfer of Care (Signed)
 Immediate Anesthesia Transfer of Care Note  Patient: Javier Rios  Procedure(s) Performed: ARTHROSCOPY, SHOULDER, WITH ROTATOR CUFF REPAIR (Right: Shoulder)  Patient Location: PACU  Anesthesia Type:General  Level of Consciousness: awake  Airway & Oxygen Therapy: Patient Spontanous Breathing and Patient connected to face mask oxygen  Post-op Assessment: Report given to RN and Post -op Vital signs reviewed and stable  Post vital signs: Reviewed and stable  Last Vitals:  Vitals Value Taken Time  BP 140/77 04/24/24 09:19  Temp    Pulse 71 04/24/24 09:20  Resp 26 04/24/24 09:22  SpO2 100 % 04/24/24 09:20  Vitals shown include unfiled device data.  Last Pain:  Vitals:   04/24/24 0633  TempSrc: Temporal  PainSc: 8       Patients Stated Pain Goal: 3 (04/24/24 9366)  Complications: No notable events documented.

## 2024-04-24 NOTE — Interval H&P Note (Signed)
 All questions answered, patient wants to proceed with procedure. ? ?

## 2024-04-25 ENCOUNTER — Encounter (HOSPITAL_BASED_OUTPATIENT_CLINIC_OR_DEPARTMENT_OTHER): Payer: Self-pay | Admitting: Orthopaedic Surgery

## 2024-05-27 ENCOUNTER — Telehealth: Payer: Self-pay | Admitting: Pharmacy Technician

## 2024-05-27 NOTE — Telephone Encounter (Signed)
° °  Pharmacy Patient Advocate Encounter   Received notification from CoverMyMeds that prior authorization for nexlizet  is required/requested.   Insurance verification completed.   The patient is insured through [employer].   Per test claim: PA required; PA submitted to above mentioned insurance via Latent Key/confirmation #/EOC [id number] Status is pending

## 2024-05-27 NOTE — Telephone Encounter (Signed)
 Pharmacy Patient Advocate Encounter  Received notification from [employer] that Prior Authorization for nexlizet  has been APPROVED from 05/27/24 to 06/11/25   PA #/Case ID/Reference #: [id number]

## 2024-06-06 ENCOUNTER — Emergency Department (HOSPITAL_BASED_OUTPATIENT_CLINIC_OR_DEPARTMENT_OTHER)

## 2024-06-06 ENCOUNTER — Other Ambulatory Visit: Payer: Self-pay

## 2024-06-06 ENCOUNTER — Emergency Department (HOSPITAL_BASED_OUTPATIENT_CLINIC_OR_DEPARTMENT_OTHER)
Admission: EM | Admit: 2024-06-06 | Discharge: 2024-06-06 | Disposition: A | Discharge status: Home / Self Care | Attending: Emergency Medicine | Admitting: Emergency Medicine

## 2024-06-06 ENCOUNTER — Encounter (HOSPITAL_BASED_OUTPATIENT_CLINIC_OR_DEPARTMENT_OTHER): Payer: Self-pay | Admitting: Emergency Medicine

## 2024-06-06 DIAGNOSIS — J101 Influenza due to other identified influenza virus with other respiratory manifestations: Secondary | ICD-10-CM | POA: Diagnosis present

## 2024-06-06 DIAGNOSIS — Z7982 Long term (current) use of aspirin: Secondary | ICD-10-CM | POA: Diagnosis not present

## 2024-06-06 DIAGNOSIS — I10 Essential (primary) hypertension: Secondary | ICD-10-CM | POA: Insufficient documentation

## 2024-06-06 LAB — BASIC METABOLIC PANEL WITH GFR
Anion gap: 16 — ABNORMAL HIGH (ref 5–15)
BUN: 16 mg/dL (ref 8–23)
CO2: 22 mmol/L (ref 22–32)
Calcium: 8.8 mg/dL — ABNORMAL LOW (ref 8.9–10.3)
Chloride: 94 mmol/L — ABNORMAL LOW (ref 98–111)
Creatinine, Ser: 1.05 mg/dL (ref 0.61–1.24)
GFR, Estimated: >60 mL/min
Glucose, Bld: 101 mg/dL — ABNORMAL HIGH (ref 70–99)
Potassium: 3.6 mmol/L (ref 3.5–5.1)
Sodium: 133 mmol/L — ABNORMAL LOW (ref 135–145)

## 2024-06-06 LAB — CBC
HCT: 44.8 % (ref 39.0–52.0)
Hemoglobin: 16.2 g/dL (ref 13.0–17.0)
MCH: 32.5 pg (ref 26.0–34.0)
MCHC: 36.2 g/dL — ABNORMAL HIGH (ref 30.0–36.0)
MCV: 89.8 fL (ref 80.0–100.0)
Platelets: 213 K/uL (ref 150–400)
RBC: 4.99 MIL/uL (ref 4.22–5.81)
RDW: 12.6 % (ref 11.5–15.5)
WBC: 4.7 K/uL (ref 4.0–10.5)
nRBC: 0 % (ref 0.0–0.2)

## 2024-06-06 LAB — RESP PANEL BY RT-PCR (RSV, FLU A&B, COVID)  RVPGX2
Influenza A by PCR: POSITIVE — AB
Influenza B by PCR: NEGATIVE
Resp Syncytial Virus by PCR: NEGATIVE
SARS Coronavirus 2 by RT PCR: NEGATIVE

## 2024-06-06 MED ORDER — ACETAMINOPHEN 500 MG PO TABS
1000.0000 mg | ORAL_TABLET | Freq: Once | ORAL | Status: AC
  Administered 2024-06-06: 1000 mg via ORAL
  Filled 2024-06-06: qty 2

## 2024-06-06 NOTE — ED Provider Notes (Signed)
 " [facility] Provider Note   CSN: [id number] Arrival date & time: 06/06/24  9377     Patient presents with: flu-like symptoms   Javier Rios is a 73 y.o. male.   Patient here with flulike symptoms.  Friend with the same symptoms.  Symptoms for the last 4 to 5 days.  OTC medication with some relief.  History of hypertension.  Denies any nausea vomiting diarrhea.  Denies any weakness numbness tingling.  General body aches decreased appetite.  No major medical problems otherwise.  The history is provided by the patient.       Prior to Admission medications  Medication Sig Start Date End Date Taking? Authorizing Provider  albuterol  (PROAIR  HFA) 108 (90 Base) MCG/ACT inhaler INHALE 2 PUFFS INTO THE LUNGS EVERY 6 (SIX) HOURS AS NEEDED FOR WHEEZING OR SHORTNESS OF BREATH. 06/15/21   Fleming, Zelda W, NP  amLODipine  (NORVASC ) 10 MG tablet Take 1 tablet (10 mg total) by mouth daily. Please make PCP appointment. 06/15/21   Fleming, Zelda W, NP  aspirin 81 MG tablet Take 81 mg by mouth daily.    [provider]  Bempedoic Acid-Ezetimibe  (NEXLIZET ) 180-10 MG TABS Take 1 tablet by mouth daily. 01/31/24   Pavero, Lonni, RPH  carvedilol  (COREG ) 6.25 MG tablet Take 1 tablet (6.25 mg total) by mouth 2 (two) times daily. 11/02/21   Newlin, Enobong, MD  chlorthalidone  (HYGROTON ) 25 MG tablet Take 1 tablet (25 mg total) by mouth daily. 06/15/21 12/03/23  Fleming, Zelda W, NP  nitroGLYCERIN  (NITROSTAT ) 0.4 MG SL tablet PLACE 1 TABLET UNDER THE TOUNGE EVERY 5MINS AS NEEDED FOR CHEST PAIN 12/12/19   Fleming, Zelda W, NP  sildenafil  (VIAGRA ) 100 MG tablet Take 100 mg by mouth daily as needed for erectile dysfunction. 02/22/22   [provider]  spironolactone  (ALDACTONE ) 50 MG tablet Take 1 tablet (50 mg total) by mouth daily. 06/15/21 04/24/24  Fleming, Zelda W, NP  tiZANidine  (ZANAFLEX ) 4 MG tablet Take 0.5-1 tablets (2-4 mg total) by mouth every 6  (six) hours as needed for muscle spasms. Patient not taking: Reported on 12/03/2023 02/05/23   Billy Asberry FALCON, PA-C    Allergies: Rosuvastatin  and Atorvastatin     Review of Systems  Updated Vital Signs BP 127/82   Pulse 82   Temp 99 F (37.2 C) (Oral)   Resp (!) 24   Ht 5' 9 (1.753 m)   Wt 68 kg   SpO2 94%   BMI 22.15 kg/m   Physical Exam Vitals and nursing note reviewed.  Constitutional:      General: He is not in acute distress.    Appearance: He is well-developed. He is not ill-appearing.  HENT:     Head: Normocephalic and atraumatic.     Nose: Nose normal.     Mouth/Throat:     Mouth: Mucous membranes are moist.  Eyes:     Extraocular Movements: Extraocular movements intact.     Conjunctiva/sclera: Conjunctivae normal.     Pupils: Pupils are equal, round, and reactive to light.  Cardiovascular:     Rate and Rhythm: Normal rate and regular rhythm.     Pulses: Normal pulses.     Heart sounds: Normal heart sounds. No murmur heard. Pulmonary:     Effort: Pulmonary effort is normal. No respiratory distress.     Breath sounds: Normal breath sounds.  Abdominal:     General: Abdomen is flat.     Palpations: Abdomen is  soft.     Tenderness: There is no abdominal tenderness.  Musculoskeletal:        General: No swelling.     Cervical back: Normal range of motion and neck supple.  Skin:    General: Skin is warm and dry.     Capillary Refill: Capillary refill takes less than 2 seconds.  Neurological:     General: No focal deficit present.     Mental Status: He is alert and oriented to person, place, and time.     Cranial Nerves: No cranial nerve deficit.     Sensory: No sensory deficit.     Motor: No weakness.     Coordination: Coordination normal.  Psychiatric:        Mood and Affect: Mood normal.     (all labs ordered are listed, but only abnormal results are displayed) Labs Reviewed  RESP PANEL BY RT-PCR (RSV, FLU A&B, COVID)  RVPGX2 - Abnormal; Notable  for the following components:      Result Value   Influenza A by PCR POSITIVE (*)    All other components within normal limits  BASIC METABOLIC PANEL WITH GFR - Abnormal; Notable for the following components:   Sodium 133 (*)    Chloride 94 (*)    Glucose, Bld 101 (*)    Calcium  8.8 (*)    Anion gap 16 (*)    All other components within normal limits  CBC - Abnormal; Notable for the following components:   MCHC 36.2 (*)    All other components within normal limits    EKG: EKG Interpretation Date/Time:  Friday June 06 2024 06:48:25 EST Ventricular Rate:  77 PR Interval:  157 QRS Duration:  100 QT Interval:  377 QTC Calculation: 427 R Axis:   79  Text Interpretation: Sinus rhythm Confirmed by Ruthe Cornet [phone]) on 06/06/2024 7:09:42 AM  Radiology: [facility] Chest Port 1 View Result Date: 06/06/2024 EXAM: 1 VIEW(S) XRAY OF THE CHEST 06/06/2024 07:24:00 AM COMPARISON: Lung cancer screening chest CT from 07/19/2023. There has been no significant interval change. CLINICAL HISTORY: Cough. FINDINGS: LINES, TUBES AND DEVICES: There are overlying telemetry leads. LUNGS AND PLEURA: The lungs are mildly emphysematous and clear. No pleural effusion. No pneumothorax. HEART AND MEDIASTINUM: No acute abnormality of the cardiac and mediastinal silhouettes. BONES AND SOFT TISSUES: No acute osseous abnormality. IMPRESSION: 1. No acute process. 2. Mild emphysema. Electronically signed by: Francis Quam MD 06/06/2024 07:42 AM EST RP Workstation: [id number]     Procedures   Medications Ordered in the ED  acetaminophen  (TYLENOL ) tablet 1,000 mg (1,000 mg Oral Given 06/06/24 9277)                                    Medical Decision Making Amount and/or Complexity of Data Reviewed Labs: ordered. Radiology: ordered.  Risk OTC drugs.   Javier Rios is here with flulike symptoms.  History of hypertension.  Low-grade fever but otherwise normal vitals.  Well-appearing.  Does not look  dehydrated on exam.  Symptoms the last several days with close contact with similar symptoms.  Differential diagnosis likely influenza but will confirm and will get a chest x-ray to make sure there is no secondary pneumonia.  He is not having any major nausea vomiting diarrhea.  Will check basic labs to make sure no major electrolyte issues or kidney injury.  I do not have any suspicion for sepsis ACS  PE or any other acute cardiac/pulmonary problem.  Patient positive for influenza but lab work is otherwise unremarkable.  No pneumonia on chest x-ray.  Supportive care at home.  Return if symptoms worsen.  This chart was dictated using voice recognition software.  Despite best efforts to proofread,  errors can occur which can change the documentation meaning.      Final diagnoses:  Influenza A    ED Discharge Orders     None          Ruthe Cornet, DO 06/06/24 9250  "

## 2024-06-06 NOTE — Discharge Instructions (Signed)
 Recommend 1000 mg of Tylenol  every 6 hours as needed for pain and fever.  Recommend 400 mg ibuprofen every 8 hours for pain and fever.  Return if symptoms worsen.

## 2024-06-06 NOTE — ED Triage Notes (Addendum)
 Non-productive cough, shob, body aches, no appetite and generalized weakness x 4-5 days. Oxygen saturation 96% on RA. R Shoulder surgery 11/13. Took ibuprofen last 2300. Has not taken his BP meds in several days.

## 2024-07-24 ENCOUNTER — Ambulatory Visit (HOSPITAL_COMMUNITY)

## 2024-07-29 ENCOUNTER — Ambulatory Visit (HOSPITAL_COMMUNITY)
